# Patient Record
Sex: Male | Born: 1940
Health system: Southern US, Community
[De-identification: ages and names within clinical notes are randomized; demographics above are authoritative.]

## PROBLEM LIST (undated history)

## (undated) DIAGNOSIS — Z860100 Personal history of colon polyps, unspecified: Secondary | ICD-10-CM

## (undated) DIAGNOSIS — M1990 Unspecified osteoarthritis, unspecified site: Secondary | ICD-10-CM

## (undated) DIAGNOSIS — K219 Gastro-esophageal reflux disease without esophagitis: Secondary | ICD-10-CM

## (undated) DIAGNOSIS — T7840XA Allergy, unspecified, initial encounter: Secondary | ICD-10-CM

## (undated) DIAGNOSIS — K649 Unspecified hemorrhoids: Secondary | ICD-10-CM

## (undated) DIAGNOSIS — R569 Unspecified convulsions: Secondary | ICD-10-CM

## (undated) DIAGNOSIS — K5730 Diverticulosis of large intestine without perforation or abscess without bleeding: Secondary | ICD-10-CM

## (undated) DIAGNOSIS — Z87442 Personal history of urinary calculi: Secondary | ICD-10-CM

## (undated) DIAGNOSIS — I517 Cardiomegaly: Secondary | ICD-10-CM

## (undated) DIAGNOSIS — E7800 Pure hypercholesterolemia, unspecified: Secondary | ICD-10-CM

## (undated) DIAGNOSIS — M109 Gout, unspecified: Secondary | ICD-10-CM

## (undated) DIAGNOSIS — K222 Esophageal obstruction: Secondary | ICD-10-CM

## (undated) DIAGNOSIS — I2510 Atherosclerotic heart disease of native coronary artery without angina pectoris: Secondary | ICD-10-CM

## (undated) DIAGNOSIS — I10 Essential (primary) hypertension: Secondary | ICD-10-CM

## (undated) DIAGNOSIS — H409 Unspecified glaucoma: Secondary | ICD-10-CM

## (undated) HISTORY — DX: Unspecified convulsions: R56.9

## (undated) HISTORY — DX: Unspecified osteoarthritis, unspecified site: M19.90

## (undated) HISTORY — DX: Essential (primary) hypertension: I10

## (undated) HISTORY — DX: Cardiomegaly: I51.7

## (undated) HISTORY — DX: Pure hypercholesterolemia, unspecified: E78.00

## (undated) HISTORY — PX: CARDIAC CATHETERIZATION: SHX172

## (undated) HISTORY — PX: CORONARY ANGIOPLASTY: SHX604

## (undated) HISTORY — PX: HERNIA REPAIR: SHX51

## (undated) HISTORY — DX: Unspecified glaucoma: H40.9

## (undated) HISTORY — DX: Personal history of colon polyps, unspecified: Z86.0100

## (undated) HISTORY — DX: Personal history of urinary calculi: Z87.442

## (undated) HISTORY — DX: Allergy, unspecified, initial encounter: T78.40XA

## (undated) HISTORY — DX: Personal history of colonic polyps: Z86.010

---

## 1982-09-01 HISTORY — PX: VASECTOMY: SHX75

## 2005-04-08 ENCOUNTER — Ambulatory Visit: Payer: Self-pay | Admitting: Unknown Physician Specialty

## 2005-12-11 ENCOUNTER — Other Ambulatory Visit: Payer: Self-pay

## 2005-12-18 ENCOUNTER — Ambulatory Visit: Payer: Self-pay | Admitting: General Surgery

## 2007-07-02 ENCOUNTER — Ambulatory Visit: Payer: Self-pay | Admitting: Unknown Physician Specialty

## 2007-07-02 LAB — HM COLONOSCOPY

## 2010-10-03 ENCOUNTER — Ambulatory Visit: Payer: Self-pay | Admitting: Anesthesiology

## 2010-10-08 ENCOUNTER — Ambulatory Visit: Payer: Self-pay | Admitting: General Surgery

## 2011-10-08 ENCOUNTER — Ambulatory Visit: Payer: Self-pay | Admitting: Internal Medicine

## 2011-12-24 ENCOUNTER — Ambulatory Visit: Payer: Self-pay | Admitting: Neurology

## 2013-07-08 ENCOUNTER — Ambulatory Visit: Payer: Self-pay | Admitting: Internal Medicine

## 2013-07-20 ENCOUNTER — Ambulatory Visit: Payer: Self-pay | Admitting: Ophthalmology

## 2013-07-20 HISTORY — PX: EYE SURGERY: SHX253

## 2013-07-29 ENCOUNTER — Encounter: Payer: Self-pay | Admitting: Internal Medicine

## 2013-07-29 ENCOUNTER — Encounter (INDEPENDENT_AMBULATORY_CARE_PROVIDER_SITE_OTHER): Payer: Self-pay

## 2013-07-29 ENCOUNTER — Ambulatory Visit (INDEPENDENT_AMBULATORY_CARE_PROVIDER_SITE_OTHER): Payer: Medicare Other | Admitting: Internal Medicine

## 2013-07-29 VITALS — BP 110/80 | HR 63 | Temp 98.1°F | Ht 68.5 in | Wt 174.0 lb

## 2013-07-29 DIAGNOSIS — I10 Essential (primary) hypertension: Secondary | ICD-10-CM

## 2013-07-29 DIAGNOSIS — N200 Calculus of kidney: Secondary | ICD-10-CM

## 2013-07-29 DIAGNOSIS — M129 Arthropathy, unspecified: Secondary | ICD-10-CM

## 2013-07-29 DIAGNOSIS — Z0000 Encounter for general adult medical examination without abnormal findings: Secondary | ICD-10-CM

## 2013-07-29 DIAGNOSIS — H409 Unspecified glaucoma: Secondary | ICD-10-CM

## 2013-07-29 DIAGNOSIS — M109 Gout, unspecified: Secondary | ICD-10-CM

## 2013-07-29 DIAGNOSIS — E7800 Pure hypercholesterolemia, unspecified: Secondary | ICD-10-CM

## 2013-07-29 DIAGNOSIS — Z9109 Other allergy status, other than to drugs and biological substances: Secondary | ICD-10-CM

## 2013-07-29 DIAGNOSIS — G40909 Epilepsy, unspecified, not intractable, without status epilepticus: Secondary | ICD-10-CM

## 2013-07-29 DIAGNOSIS — K219 Gastro-esophageal reflux disease without esophagitis: Secondary | ICD-10-CM

## 2013-07-29 DIAGNOSIS — M1990 Unspecified osteoarthritis, unspecified site: Secondary | ICD-10-CM

## 2013-07-29 DIAGNOSIS — Z23 Encounter for immunization: Secondary | ICD-10-CM

## 2013-07-29 DIAGNOSIS — Z86010 Personal history of colonic polyps: Secondary | ICD-10-CM

## 2013-07-29 LAB — COMPREHENSIVE METABOLIC PANEL
ALT: 18 U/L (ref 0–53)
AST: 20 U/L (ref 0–37)
Albumin: 4 g/dL (ref 3.5–5.2)
Alkaline Phosphatase: 64 U/L (ref 39–117)
BUN: 15 mg/dL (ref 6–23)
CO2: 28 mEq/L (ref 19–32)
Calcium: 9.4 mg/dL (ref 8.4–10.5)
Chloride: 103 mEq/L (ref 96–112)
Creatinine, Ser: 1.1 mg/dL (ref 0.4–1.5)
GFR: 68.47 mL/min (ref >60.00)
Glucose, Bld: 99 mg/dL (ref 70–99)
Potassium: 4.1 mEq/L (ref 3.5–5.1)
Sodium: 137 mEq/L (ref 135–145)
Total Bilirubin: 0.9 mg/dL (ref 0.3–1.2)
Total Protein: 7.4 g/dL (ref 6.0–8.3)

## 2013-07-29 LAB — CBC WITH DIFFERENTIAL/PLATELET
Basophils Absolute: 0 10*3/uL (ref 0.0–0.1)
Basophils Relative: 0.4 % (ref 0.0–3.0)
Eosinophils Absolute: 0.1 10*3/uL (ref 0.0–0.7)
Eosinophils Relative: 2.2 % (ref 0.0–5.0)
HCT: 45.8 % (ref 39.0–52.0)
Hemoglobin: 15.7 g/dL (ref 13.0–17.0)
Lymphocytes Relative: 24.1 % (ref 12.0–46.0)
Lymphs Abs: 1.5 10*3/uL (ref 0.7–4.0)
MCHC: 34.4 g/dL (ref 30.0–36.0)
MCV: 84.3 fl (ref 78.0–100.0)
Monocytes Absolute: 0.5 10*3/uL (ref 0.1–1.0)
Monocytes Relative: 8.6 % (ref 3.0–12.0)
Neutro Abs: 4 10*3/uL (ref 1.4–7.7)
Neutrophils Relative %: 64.7 % (ref 43.0–77.0)
Platelets: 262 10*3/uL (ref 150.0–400.0)
RBC: 5.43 Mil/uL (ref 4.22–5.81)
RDW: 13.7 % (ref 11.5–14.6)
WBC: 6.1 10*3/uL (ref 4.5–10.5)

## 2013-07-29 LAB — LIPID PANEL
Cholesterol: 192 mg/dL (ref 0–200)
HDL: 31.1 mg/dL — ABNORMAL LOW (ref >39.00)
Total CHOL/HDL Ratio: 6
Triglycerides: 234 mg/dL — ABNORMAL HIGH (ref 0.0–149.0)
VLDL: 46.8 mg/dL — ABNORMAL HIGH (ref 0.0–40.0)

## 2013-07-29 LAB — TSH: TSH: 0.51 u[IU]/mL (ref 0.35–5.50)

## 2013-07-29 LAB — LDL CHOLESTEROL, DIRECT: Direct LDL: 99.1 mg/dL

## 2013-07-29 MED ORDER — CLOBETASOL PROPIONATE 0.05 % EX CREA: 1.0000 "application " | TOPICAL_CREAM | Freq: Two times a day (BID) | CUTANEOUS | Status: DC

## 2013-07-29 NOTE — Progress Notes (Signed)
Pre-visit discussion using our clinic review tool. No additional management support is needed unless otherwise documented below in the visit note.  

## 2013-07-30 LAB — VARICELLA ZOSTER ANTIBODY, IGG: Varicella IgG: 2326 Index — ABNORMAL HIGH (ref <135.00)

## 2013-07-31 ENCOUNTER — Encounter: Payer: Self-pay | Admitting: Internal Medicine

## 2013-07-31 DIAGNOSIS — Z9109 Other allergy status, other than to drugs and biological substances: Secondary | ICD-10-CM | POA: Insufficient documentation

## 2013-07-31 DIAGNOSIS — M109 Gout, unspecified: Secondary | ICD-10-CM | POA: Insufficient documentation

## 2013-07-31 DIAGNOSIS — N200 Calculus of kidney: Secondary | ICD-10-CM | POA: Insufficient documentation

## 2013-07-31 DIAGNOSIS — G40909 Epilepsy, unspecified, not intractable, without status epilepticus: Secondary | ICD-10-CM | POA: Insufficient documentation

## 2013-07-31 DIAGNOSIS — H409 Unspecified glaucoma: Secondary | ICD-10-CM | POA: Insufficient documentation

## 2013-07-31 DIAGNOSIS — Z86010 Personal history of colonic polyps: Secondary | ICD-10-CM | POA: Insufficient documentation

## 2013-07-31 DIAGNOSIS — M1990 Unspecified osteoarthritis, unspecified site: Secondary | ICD-10-CM | POA: Insufficient documentation

## 2013-07-31 DIAGNOSIS — I10 Essential (primary) hypertension: Secondary | ICD-10-CM | POA: Insufficient documentation

## 2013-07-31 DIAGNOSIS — E7800 Pure hypercholesterolemia, unspecified: Secondary | ICD-10-CM | POA: Insufficient documentation

## 2013-07-31 DIAGNOSIS — K219 Gastro-esophageal reflux disease without esophagitis: Secondary | ICD-10-CM | POA: Insufficient documentation

## 2013-07-31 NOTE — Assessment & Plan Note (Signed)
Currently controlled.  No problems reported today.
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
## 2013-07-31 NOTE — Assessment & Plan Note (Signed)
Low cholesterol diet and exercise.  Follow lipid panel.  On no medication now.

## 2013-07-31 NOTE — Assessment & Plan Note (Signed)
Following with Dr Shah.  On Keppra.  Doing well.  No reoccurring seizures since starting.  Follow.   

## 2013-07-31 NOTE — Assessment & Plan Note (Signed)
Has a history of kidney stones.  Last episode in the 80's.  Follow.

## 2013-07-31 NOTE — Assessment & Plan Note (Signed)
Had a colonoscopy in 2008.  Performed by Dr Markham Jordan.  Need to know when due follow up.  Obtain records to review.

## 2013-07-31 NOTE — Assessment & Plan Note (Signed)
Sees opthalmology.  Follow.   

## 2013-07-31 NOTE — Assessment & Plan Note (Signed)
Some previous knee pain.  Better now.  Some stiffness in his fingers.  Denies any further intervention at this time.  Follow.

## 2013-07-31 NOTE — Assessment & Plan Note (Signed)
Blood pressure doing well.  Check metabolic panel.   

## 2013-07-31 NOTE — Assessment & Plan Note (Signed)
Takes colcrys prn.  Follow.  No problems reported.

## 2013-07-31 NOTE — Assessment & Plan Note (Signed)
Takes omeprazole.  No problems reported.

## 2013-08-01 ENCOUNTER — Encounter: Payer: Self-pay | Admitting: *Deleted

## 2013-08-09 ENCOUNTER — Telehealth: Payer: Self-pay | Admitting: Internal Medicine

## 2013-08-09 ENCOUNTER — Ambulatory Visit (INDEPENDENT_AMBULATORY_CARE_PROVIDER_SITE_OTHER): Payer: Medicare Other | Admitting: *Deleted

## 2013-08-09 DIAGNOSIS — Z23 Encounter for immunization: Secondary | ICD-10-CM

## 2013-08-09 NOTE — Telephone Encounter (Signed)
Pt says that on his last visit him and Dr. Lorin Picket discussed a referral to Dr. Adolphus Birchwood for dermatology.

## 2013-08-09 NOTE — Telephone Encounter (Signed)
See me about this.  I am ok with referral.

## 2013-08-09 NOTE — Telephone Encounter (Signed)
Dr.Scott Please advise? 

## 2013-08-11 NOTE — Telephone Encounter (Signed)
10/03/13 @ 1030- [facility]

## 2013-08-11 NOTE — Telephone Encounter (Signed)
Pt to see Dr. Gwen Pounds @ [facility] on 2nd at 1030. LVM for patient to call our office.

## 2013-08-12 NOTE — Telephone Encounter (Signed)
Pt aware of apt.  

## 2013-08-17 ENCOUNTER — Encounter: Payer: Self-pay | Admitting: Internal Medicine

## 2013-08-17 NOTE — Telephone Encounter (Signed)
Prescription written and placed in your box.  I sent him a my chart message informing him rx would be here.

## 2013-09-15 ENCOUNTER — Encounter: Payer: Self-pay | Admitting: Internal Medicine

## 2013-09-28 ENCOUNTER — Ambulatory Visit: Payer: Self-pay | Admitting: Ophthalmology

## 2013-09-29 ENCOUNTER — Encounter: Payer: Self-pay | Admitting: Internal Medicine

## 2013-11-30 ENCOUNTER — Encounter: Payer: Self-pay | Admitting: Internal Medicine

## 2013-12-05 ENCOUNTER — Other Ambulatory Visit: Payer: Self-pay | Admitting: *Deleted

## 2013-12-05 MED ORDER — METOPROLOL TARTRATE 25 MG PO TABS: 12.5000 mg | ORAL_TABLET | Freq: Every day | ORAL | Status: DC

## 2013-12-05 MED ORDER — OMEPRAZOLE 20 MG PO CPDR: 40.0000 mg | DELAYED_RELEASE_CAPSULE | Freq: Every morning | ORAL | Status: DC

## 2013-12-05 MED ORDER — METOPROLOL TARTRATE 12.5 MG HALF TABLET: 12.5000 mg | ORAL_TABLET | Freq: Every day | ORAL | Status: DC

## 2013-12-05 MED ORDER — FLUTICASONE PROPIONATE 50 MCG/ACT NA SUSP: 2.0000 | Freq: Every day | NASAL | Status: DC | PRN

## 2013-12-15 ENCOUNTER — Ambulatory Visit (INDEPENDENT_AMBULATORY_CARE_PROVIDER_SITE_OTHER): Payer: Commercial Managed Care - HMO | Admitting: Internal Medicine

## 2013-12-15 ENCOUNTER — Encounter: Payer: Self-pay | Admitting: Internal Medicine

## 2013-12-15 VITALS — BP 120/70 | HR 63 | Temp 98.1°F | Ht 67.5 in | Wt 176.5 lb

## 2013-12-15 DIAGNOSIS — H409 Unspecified glaucoma: Secondary | ICD-10-CM

## 2013-12-15 DIAGNOSIS — Z860100 Personal history of colon polyps, unspecified: Secondary | ICD-10-CM

## 2013-12-15 DIAGNOSIS — I25119 Atherosclerotic heart disease of native coronary artery with unspecified angina pectoris: Secondary | ICD-10-CM | POA: Insufficient documentation

## 2013-12-15 DIAGNOSIS — Z125 Encounter for screening for malignant neoplasm of prostate: Secondary | ICD-10-CM

## 2013-12-15 DIAGNOSIS — K219 Gastro-esophageal reflux disease without esophagitis: Secondary | ICD-10-CM

## 2013-12-15 DIAGNOSIS — Z9109 Other allergy status, other than to drugs and biological substances: Secondary | ICD-10-CM

## 2013-12-15 DIAGNOSIS — E7800 Pure hypercholesterolemia, unspecified: Secondary | ICD-10-CM

## 2013-12-15 DIAGNOSIS — Z79899 Other long term (current) drug therapy: Secondary | ICD-10-CM

## 2013-12-15 DIAGNOSIS — I2510 Atherosclerotic heart disease of native coronary artery without angina pectoris: Secondary | ICD-10-CM | POA: Insufficient documentation

## 2013-12-15 DIAGNOSIS — R079 Chest pain, unspecified: Secondary | ICD-10-CM

## 2013-12-15 DIAGNOSIS — G40909 Epilepsy, unspecified, not intractable, without status epilepticus: Secondary | ICD-10-CM

## 2013-12-15 DIAGNOSIS — I10 Essential (primary) hypertension: Secondary | ICD-10-CM

## 2013-12-15 LAB — MAGNESIUM: MAGNESIUM: 1.7 mg/dL (ref 1.5–2.5)

## 2013-12-15 LAB — COMPREHENSIVE METABOLIC PANEL
ALBUMIN: 3.8 g/dL (ref 3.5–5.2)
ALT: 19 U/L (ref 0–53)
AST: 23 U/L (ref 0–37)
Alkaline Phosphatase: 64 U/L (ref 39–117)
BUN: 14 mg/dL (ref 6–23)
CHLORIDE: 103 meq/L (ref 96–112)
CO2: 25 meq/L (ref 19–32)
CREATININE: 1.1 mg/dL (ref 0.4–1.5)
Calcium: 9.3 mg/dL (ref 8.4–10.5)
GFR: 69.11 mL/min (ref >60.00)
GLUCOSE: 97 mg/dL (ref 70–99)
POTASSIUM: 4.2 meq/L (ref 3.5–5.1)
Sodium: 138 mEq/L (ref 135–145)
Total Bilirubin: 1.3 mg/dL — ABNORMAL HIGH (ref 0.3–1.2)
Total Protein: 6.9 g/dL (ref 6.0–8.3)

## 2013-12-15 LAB — LIPID PANEL
CHOLESTEROL: 197 mg/dL (ref 0–200)
HDL: 29.5 mg/dL — ABNORMAL LOW (ref >39.00)
LDL CALC: 117 mg/dL — AB (ref 0–99)
TRIGLYCERIDES: 254 mg/dL — AB (ref 0.0–149.0)
Total CHOL/HDL Ratio: 7
VLDL: 50.8 mg/dL — ABNORMAL HIGH (ref 0.0–40.0)

## 2013-12-15 LAB — PSA, MEDICARE: PSA: 1.05 ng/mL (ref 0.10–4.00)

## 2013-12-15 NOTE — Assessment & Plan Note (Signed)
Blood pressure doing well.  Check metabolic panel.   

## 2013-12-15 NOTE — Progress Notes (Signed)
Pre visit review using our clinic review tool, if applicable. No additional management support is needed unless otherwise documented below in the visit note. 

## 2013-12-15 NOTE — Progress Notes (Signed)
Subjective:    Patient ID: Edwin [name], male    DOB: [birth year], 73 y.o.   MRN: [id number]  HPI 73 year old male with past history of hypercholesterolemia, glaucoma, allergies and seizure disorder.  He comes in to day to follow up on these issues as well as for a complete physical exam.  He states he has been doing relatively well.  Continues to follow with Dr Manuella Ghazi for his seizures.  No reoccurrence after starting Keppra.  Had MRI - ok.  Had carotid ultrasound - ok.   Has a history of hypertension.  No significant urinary symptoms.  Previously had negative prostate biopsy.  He does report some increased problems with allergies.  Just started taking allegra on a more regular basis.  Has previously used flonase, but feels this recently aggravated his nose.  Not using saline.  He also reports noticing some chest discomfort with push mowing.  The chest discomforts only occurs at this time.  Stays active.  Breathing stable.  Bowels stable.    Past Medical History  Diagnosis Date  . Arthritis   . Glaucoma   . Allergy   . Hypercholesterolemia   . Seizures   . History of kidney stones   . Hx of colonic polyp     Outpatient Encounter Prescriptions as of 12/15/2013  Medication Sig  . aspirin 81 MG tablet Take 81 mg by mouth at bedtime.  . Cholecalciferol (VITAMIN D3) 2000 UNITS TABS Take by mouth every morning.  . clobetasol cream (TEMOVATE) 7.84 % Apply 1 application topically 2 (two) times daily. Do not use on face or genital area.  Do not use for more 7-10 days in the same place.  . colchicine 0.6 MG tablet Take 0.6 mg by mouth daily as needed.  . fluticasone (FLONASE) 50 MCG/ACT nasal spray Place 2 sprays into both nostrils daily as needed for allergies or rhinitis.  Marland Kitchen latanoprost (XALATAN) 0.005 % ophthalmic solution Place 1 drop into both eyes at bedtime.  . levETIRAcetam (KEPPRA) 500 MG tablet Take 500 mg by mouth 2 (two) times daily.  Marland Kitchen levocetirizine (XYZAL) 5 MG tablet Take 5 mg  by mouth at bedtime as needed for allergies.  . Magnesium 400 MG CAPS Take by mouth at bedtime.  . metoprolol tartrate (LOPRESSOR) 25 MG tablet Take 0.5 tablets (12.5 mg total) by mouth at bedtime.  . Misc Natural Products (TART CHERRY ADVANCED PO) Take 500 mg by mouth every morning.  . Multiple Vitamin (MULTIVITAMIN) tablet Take 1 tablet by mouth daily.  . Omega-3 Fatty Acids (FISH OIL) 1200 MG CAPS Take by mouth 2 (two) times daily.  Marland Kitchen omeprazole (PRILOSEC) 20 MG capsule Take 2 capsules (40 mg total) by mouth every morning.  . polyethylene glycol (MIRALAX / GLYCOLAX) packet Take 17 g by mouth at bedtime.  . prednisoLONE acetate (PRED FORTE) 1 % ophthalmic suspension Place 1 drop into the left eye 3 (three) times daily.  . Psyllium (METAMUCIL PO) Take by mouth every morning.  . Saw Palmetto 450 MG CAPS Take by mouth every morning.    Review of Systems Patient denies any headache, lightheadedness or dizziness.  Allergy symptoms as outlined.   Chest discomfort as outlined.  No increased shortness of breath, cough or congestion.  No known acid reflux.   Takes prilosec regularly.  No nausea or vomiting.  No abdominal pain or cramping.  No bowel change, such as diarrhea, constipation, BRBPR or melana.  No urine change.  No further seizures.  On keppra.         Objective:   Physical Exam  Filed Vitals:   12/15/13 0835  BP: 120/70  Pulse: 63  Temp: 98.1 F (36.7 C)   Blood pressure recheck:  2/61  73 year old male in no acute distress.  HEENT:  Nares - clear.  Oropharynx - without lesions. NECK:  Supple.  Nontender.  No audible carotid bruit.  HEART:  Appears to be regular.   LUNGS:  No crackles or wheezing audible.  Respirations even and unlabored.   RADIAL PULSE:  Equal bilaterally.  ABDOMEN:  Soft.  Nontender.  Bowel sounds present and normal.  No audible abdominal bruit.  GU:  Normal descended testicles.  No palpable testicular nodules.   RECTAL:  Prostate enlarged.   Heme  negative.   EXTREMITIES:  No increased edema present.  DP pulses palpable and equal bilaterally.         Assessment & Plan:  HEALTH MAINTENANCE.  Physical today.   Check psa today.  Colonoscopy 2008.  States due f/u five years since last.  Will refer to GI.  Will need cariology evaluation first.     I spent 25 minutes with the patient and more than 50% of the time was spent in consultation regarding the above.

## 2013-12-15 NOTE — Assessment & Plan Note (Signed)
Worsened recently.  On allegra.  Just started regularly.  He usually uses flonase, but recently stopped.  Thought it made things worse.  Does not use saline.  Instructed on saline nasal spray.  Can try nasacort nasal spray and see if tolerates better.  Continue allegra.  Notify me if symptoms persist.

## 2013-12-15 NOTE — Assessment & Plan Note (Signed)
Sees opthalmology.  Follow.

## 2013-12-15 NOTE — Assessment & Plan Note (Signed)
Low cholesterol diet and exercise.  Follow lipid panel.  On no medication now.  Recheck today.

## 2013-12-15 NOTE — Assessment & Plan Note (Signed)
Following with Dr Manuella Ghazi.  On Keppra.  Doing well.  No reoccurring seizures since starting.  Follow.

## 2013-12-15 NOTE — Assessment & Plan Note (Signed)
Had a colonoscopy in 2008.  Performed by Dr Tiffany Kocher.  States due f/u colonoscopy five years after last.  Will schedule an appt with GI for evaluation for colonoscopy.

## 2013-12-15 NOTE — Assessment & Plan Note (Signed)
Takes omeprazole.  No problems reported.  Has had EGD.

## 2013-12-18 ENCOUNTER — Encounter: Payer: Self-pay | Admitting: Internal Medicine

## 2013-12-18 ENCOUNTER — Telehealth: Payer: Self-pay | Admitting: Internal Medicine

## 2013-12-18 NOTE — Assessment & Plan Note (Signed)
He has noticed chest pain with push mowing.  EKG obtained and revealed SR with TWI in Lead III and some voltage criteria for LVH.  Given symptoms, will refer to cardiology for further evaluation and testing.

## 2013-12-18 NOTE — Telephone Encounter (Signed)
Pt notified of lab results via my chart.  Needs a non fasting lab in 2 weeks.  Please schedule and contact him with a lab appt date and time.  Thanks.    Dr Nicki Reaper

## 2013-12-20 NOTE — Telephone Encounter (Signed)
Appointment 5/6 sent my chart messge letting pt know about appointment date and time

## 2013-12-26 ENCOUNTER — Encounter: Payer: Self-pay | Admitting: Internal Medicine

## 2013-12-29 ENCOUNTER — Encounter: Payer: Self-pay | Admitting: Internal Medicine

## 2014-01-03 ENCOUNTER — Other Ambulatory Visit (INDEPENDENT_AMBULATORY_CARE_PROVIDER_SITE_OTHER): Payer: Commercial Managed Care - HMO

## 2014-01-03 ENCOUNTER — Telehealth: Payer: Self-pay | Admitting: Internal Medicine

## 2014-01-03 DIAGNOSIS — R17 Unspecified jaundice: Secondary | ICD-10-CM

## 2014-01-03 NOTE — Telephone Encounter (Signed)
Pt came in to office to check status of cardiology referral to Dr. Ubaldo Glassing.  No appt made at this time.

## 2014-01-04 ENCOUNTER — Other Ambulatory Visit: Payer: Commercial Managed Care - HMO

## 2014-01-04 ENCOUNTER — Encounter: Payer: Self-pay | Admitting: Internal Medicine

## 2014-01-04 LAB — HEPATIC FUNCTION PANEL
ALT: 21 U/L (ref 0–53)
AST: 24 U/L (ref 0–37)
Albumin: 4 g/dL (ref 3.5–5.2)
Alkaline Phosphatase: 64 U/L (ref 39–117)
BILIRUBIN TOTAL: 0.9 mg/dL (ref 0.2–1.2)
Bilirubin, Direct: 0.1 mg/dL (ref 0.0–0.3)
Total Protein: 7 g/dL (ref 6.0–8.3)

## 2014-01-12 NOTE — Telephone Encounter (Signed)
Patient had an appt with Dr. Kyra Searles on 01/09/14. Received [employer] auth for Dr. Kyra Searles Received fax from [employer] back Treating Provider Dr. Bartholome Bill # of Visits 6 Start Date 01/09/14 End Date 04/09/14 CPT 99499 DX 786.50

## 2014-01-26 ENCOUNTER — Ambulatory Visit: Payer: Self-pay | Admitting: Cardiology

## 2014-01-26 LAB — CK TOTAL AND CKMB (NOT AT ARMC)
CK, TOTAL: 88 U/L
CK-MB: 1.9 ng/mL (ref 0.5–3.6)

## 2014-01-27 LAB — BASIC METABOLIC PANEL
Anion Gap: 6 — ABNORMAL LOW (ref 7–16)
BUN: 14 mg/dL (ref 7–18)
Calcium, Total: 8.3 mg/dL — ABNORMAL LOW (ref 8.5–10.1)
Chloride: 110 mmol/L — ABNORMAL HIGH (ref 98–107)
Co2: 23 mmol/L (ref 21–32)
Creatinine: 0.97 mg/dL (ref 0.60–1.30)
EGFR (African American): >60
Glucose: 89 mg/dL (ref 65–99)
Osmolality: 277 (ref 275–301)
POTASSIUM: 3.7 mmol/L (ref 3.5–5.1)
SODIUM: 139 mmol/L (ref 136–145)

## 2014-02-06 ENCOUNTER — Telehealth: Payer: Self-pay | Admitting: Internal Medicine

## 2014-02-06 DIAGNOSIS — I2510 Atherosclerotic heart disease of native coronary artery without angina pectoris: Secondary | ICD-10-CM

## 2014-02-06 NOTE — Telephone Encounter (Addendum)
Patient dropped off records from stent placement. Papers are in Dr. Bary Leriche box. Patient will also need another referral to Dr. Bethanne Ginger office to be seen later. Patient stated that he never received a referral for a dermatologist.msn

## 2014-02-07 ENCOUNTER — Other Ambulatory Visit: Payer: Self-pay | Admitting: Internal Medicine

## 2014-02-07 DIAGNOSIS — L989 Disorder of the skin and subcutaneous tissue, unspecified: Secondary | ICD-10-CM

## 2014-02-07 NOTE — Telephone Encounter (Signed)
I have placed the order for the cardiology referral.  Please clarify with him why he needs dermatology referral.

## 2014-02-07 NOTE — Progress Notes (Signed)
Order placed for dermatology referral.  

## 2014-02-07 NOTE — Telephone Encounter (Signed)
Order placed for cardiology referral.   

## 2014-02-07 NOTE — Telephone Encounter (Signed)
Order has already been placed for cardiology referral.  Order also placed for dermatology referral.

## 2014-02-07 NOTE — Telephone Encounter (Signed)
Placed in folder

## 2014-02-07 NOTE — Telephone Encounter (Signed)
Pt notified of cardiology referral & states that he saw Dr. Nehemiah Massed back in February & wanted him to return for a follow-up. They would not scheduled the f/u until they received the referral. He was being seen to evaluate molds & other spots on his body.

## 2014-02-27 ENCOUNTER — Encounter: Payer: Self-pay | Admitting: Internal Medicine

## 2014-03-06 ENCOUNTER — Encounter: Payer: Self-pay | Admitting: Internal Medicine

## 2014-03-06 DIAGNOSIS — I2510 Atherosclerotic heart disease of native coronary artery without angina pectoris: Secondary | ICD-10-CM

## 2014-03-21 ENCOUNTER — Telehealth: Payer: Self-pay | Admitting: Internal Medicine

## 2014-03-21 DIAGNOSIS — H409 Unspecified glaucoma: Secondary | ICD-10-CM

## 2014-03-21 NOTE — Telephone Encounter (Signed)
My chart message sent to see if pt needed an earlier appt.

## 2014-03-24 NOTE — Telephone Encounter (Signed)
Order placed for opthalmology referral 

## 2014-03-24 NOTE — Addendum Note (Signed)
Addended by: Alisa Graff on: 03/24/2014 01:21 PM   Modules accepted: Orders

## 2014-03-29 ENCOUNTER — Telehealth: Payer: Self-pay

## 2014-03-29 NOTE — Telephone Encounter (Signed)
The pt has received [facility] authorization for Dr.Brasington  Auth # [id number] (good through 09/2014, for 6 visits)

## 2014-04-04 ENCOUNTER — Telehealth: Payer: Self-pay | Admitting: Internal Medicine

## 2014-04-04 NOTE — Telephone Encounter (Signed)
Pt had sent me a message requesting an earlier appt.  Please schedule him for 04/06/14 at 2:00 - block 30 minutes.  Thanks.  Dr Nicki Reaper

## 2014-04-06 ENCOUNTER — Ambulatory Visit (INDEPENDENT_AMBULATORY_CARE_PROVIDER_SITE_OTHER): Payer: Commercial Managed Care - HMO | Admitting: Internal Medicine

## 2014-04-06 ENCOUNTER — Encounter: Payer: Self-pay | Admitting: Internal Medicine

## 2014-04-06 VITALS — BP 100/70 | HR 67 | Temp 98.0°F | Ht 67.5 in | Wt 171.5 lb

## 2014-04-06 DIAGNOSIS — E7800 Pure hypercholesterolemia, unspecified: Secondary | ICD-10-CM

## 2014-04-06 DIAGNOSIS — R5381 Other malaise: Secondary | ICD-10-CM

## 2014-04-06 DIAGNOSIS — I2510 Atherosclerotic heart disease of native coronary artery without angina pectoris: Secondary | ICD-10-CM

## 2014-04-06 DIAGNOSIS — I10 Essential (primary) hypertension: Secondary | ICD-10-CM

## 2014-04-06 DIAGNOSIS — K219 Gastro-esophageal reflux disease without esophagitis: Secondary | ICD-10-CM

## 2014-04-06 DIAGNOSIS — R5383 Other fatigue: Secondary | ICD-10-CM

## 2014-04-06 NOTE — Progress Notes (Signed)
Pre visit review using our clinic review tool, if applicable. No additional management support is needed unless otherwise documented below in the visit note. 

## 2014-04-07 ENCOUNTER — Encounter: Payer: Self-pay | Admitting: Internal Medicine

## 2014-04-07 LAB — URINALYSIS, ROUTINE W REFLEX MICROSCOPIC
Bilirubin Urine: NEGATIVE
Hgb urine dipstick: NEGATIVE
Leukocytes, UA: NEGATIVE
Nitrite: NEGATIVE
PH: 6 (ref 5.0–8.0)
RBC / HPF: NONE SEEN (ref 0–?)
SPECIFIC GRAVITY, URINE: 1.02 (ref 1.000–1.030)
Total Protein, Urine: NEGATIVE
Urine Glucose: NEGATIVE
Urobilinogen, UA: 0.2 (ref 0.0–1.0)
WBC, UA: NONE SEEN (ref 0–?)

## 2014-04-07 LAB — CBC WITH DIFFERENTIAL/PLATELET
Basophils Absolute: 0 10*3/uL (ref 0.0–0.1)
Basophils Relative: 0.7 % (ref 0.0–3.0)
Eosinophils Absolute: 0.2 10*3/uL (ref 0.0–0.7)
Eosinophils Relative: 3.4 % (ref 0.0–5.0)
HCT: 42.5 % (ref 39.0–52.0)
Hemoglobin: 14.6 g/dL (ref 13.0–17.0)
Lymphocytes Relative: 23.3 % (ref 12.0–46.0)
Lymphs Abs: 1.4 10*3/uL (ref 0.7–4.0)
MCHC: 34.4 g/dL (ref 30.0–36.0)
MCV: 85.5 fl (ref 78.0–100.0)
MONO ABS: 0.5 10*3/uL (ref 0.1–1.0)
Monocytes Relative: 8.3 % (ref 3.0–12.0)
Neutro Abs: 3.9 10*3/uL (ref 1.4–7.7)
Neutrophils Relative %: 64.3 % (ref 43.0–77.0)
PLATELETS: 254 10*3/uL (ref 150.0–400.0)
RBC: 4.97 Mil/uL (ref 4.22–5.81)
RDW: 13.8 % (ref 11.5–15.5)
WBC: 6.1 10*3/uL (ref 4.0–10.5)

## 2014-04-07 LAB — BASIC METABOLIC PANEL
BUN: 15 mg/dL (ref 6–23)
CO2: 25 mEq/L (ref 19–32)
Calcium: 9.4 mg/dL (ref 8.4–10.5)
Chloride: 107 mEq/L (ref 96–112)
Creatinine, Ser: 1.1 mg/dL (ref 0.4–1.5)
GFR: 70.52 mL/min (ref >60.00)
Glucose, Bld: 92 mg/dL (ref 70–99)
Potassium: 4 mEq/L (ref 3.5–5.1)
SODIUM: 138 meq/L (ref 135–145)

## 2014-04-09 ENCOUNTER — Encounter: Payer: Self-pay | Admitting: Internal Medicine

## 2014-04-09 DIAGNOSIS — R5383 Other fatigue: Secondary | ICD-10-CM | POA: Insufficient documentation

## 2014-04-09 DIAGNOSIS — R531 Weakness: Secondary | ICD-10-CM | POA: Insufficient documentation

## 2014-04-09 NOTE — Assessment & Plan Note (Signed)
Is s/p PCI LAD.  On plavix.  Doing well.  Followed by Dr Ubaldo Glassing.

## 2014-04-09 NOTE — Progress Notes (Signed)
Subjective:    Patient ID: Edwin [name], male    DOB: 1940-10-02, 73 y.o.   MRN: [id number]  HPI 73 year old male with past history of hypercholesterolemia, glaucoma, allergies and seizure disorder.  He comes in today as a work in appt.  His wife had mentioned that he appears to be having to take a nap during the day and previously did not take a nap.   He was found recently to have an abnormal functional study.  Is now s/p PCI of his LAD (5/15).  Sees Dr Ubaldo Glassing.  Has been doing well from a cardiac standpoint.  Was started on plavix and simvastatin after his stent placement.   He states he has been doing relatively well.  Continues to follow with Dr Manuella Ghazi for his seizures.  No reoccurrence after starting Keppra.  Had MRI - ok.  Had carotid ultrasound - ok.   Has a history of hypertension.  No significant urinary symptoms.   Stays active.  Breathing stable.  Bowels stable.  He reports he is sleeping well.  Feels rested when he awakens.  States sometimes he will take a short nap during the day, but does not feel overly fatigued.  He is walking.  No chest pain or tightness with increased activity or exertion.     Past Medical History  Diagnosis Date  . Arthritis   . Glaucoma   . Allergy   . Hypercholesterolemia   . Seizures   . History of kidney stones   . Hx of colonic polyp     Outpatient Encounter Prescriptions as of 04/06/2014  Medication Sig  . aspirin 81 MG tablet Take 81 mg by mouth at bedtime.  . Cholecalciferol (VITAMIN D3) 2000 UNITS TABS Take by mouth every morning.  . clobetasol cream (TEMOVATE) 5.10 % Apply 1 application topically 2 (two) times daily. Do not use on face or genital area.  Do not use for more 7-10 days in the same place.  . clopidogrel (PLAVIX) 75 MG tablet Take 75 mg by mouth daily.  . colchicine 0.6 MG tablet Take 0.6 mg by mouth daily as needed.  . fluticasone (FLONASE) 50 MCG/ACT nasal spray Place 2 sprays into both nostrils daily as needed for allergies or  rhinitis.  Marland Kitchen latanoprost (XALATAN) 0.005 % ophthalmic solution Place 1 drop into both eyes at bedtime.  . levETIRAcetam (KEPPRA) 500 MG tablet Take 500 mg by mouth 2 (two) times daily.  Marland Kitchen levocetirizine (XYZAL) 5 MG tablet Take 5 mg by mouth at bedtime as needed for allergies.  . Magnesium 400 MG CAPS Take by mouth at bedtime.  . metoprolol tartrate (LOPRESSOR) 25 MG tablet Take 0.5 tablets (12.5 mg total) by mouth at bedtime.  . Misc Natural Products (TART CHERRY ADVANCED PO) Take 500 mg by mouth every morning.  . Multiple Vitamin (MULTIVITAMIN) tablet Take 1 tablet by mouth daily.  . Omega-3 Fatty Acids (FISH OIL) 1200 MG CAPS Take by mouth 2 (two) times daily.  Marland Kitchen omeprazole (PRILOSEC) 20 MG capsule Take 2 capsules (40 mg total) by mouth every morning.  . polyethylene glycol (MIRALAX / GLYCOLAX) packet Take 17 g by mouth at bedtime.  . prednisoLONE acetate (PRED FORTE) 1 % ophthalmic suspension Place 1 drop into the left eye 3 (three) times daily.  . Psyllium (METAMUCIL PO) Take by mouth every morning.  . Saw Palmetto 450 MG CAPS Take by mouth every morning.  . simvastatin (ZOCOR) 40 MG tablet Take 40 mg by mouth  daily.    Review of Systems Patient denies any headache, lightheadedness or dizziness.  No chest pain, tightness or palpitations.  No increased shortness of breath, cough or congestion.  No known acid reflux.   Takes prilosec regularly.  Given on plavix, ciscussed with him regarding changing to protonix.  No nausea or vomiting.  No abdominal pain or cramping.  No bowel change, such as diarrhea, constipation, BRBPR or melana.  No urine change.  No further seizures.  On keppra.   Is walking.        Objective:   Physical Exam  Filed Vitals:   04/06/14 1415  BP: 100/70  Pulse: 67  Temp: 98 F (36.7 C)   Blood pressure recheck:  74/49  73 year old male in no acute distress.  HEENT:  Nares - clear.  Oropharynx - without lesions. NECK:  Supple.  Nontender.  No audible carotid  bruit.  HEART:  Appears to be regular.   LUNGS:  No crackles or wheezing audible.  Respirations even and unlabored.   RADIAL PULSE:  Equal bilaterally.  ABDOMEN:  Soft.  Nontender.  Bowel sounds present and normal.  No audible abdominal bruit.  EXTREMITIES:  No increased edema present.  DP pulses palpable and equal bilaterally.         Assessment & Plan:  HEALTH MAINTENANCE.  Physical last visit.   PSA just checked and wnl.   Colonoscopy 2008.  States was due f/u five years.  Has stent now.  On plavix.  Will need to postpone for now.    I spent 25 minutes with the patient and more than 50% of the time was spent in consultation regarding the above.

## 2014-04-09 NOTE — Assessment & Plan Note (Addendum)
Pt reports no significant fatigue.  See HPI.  Just had intervention.  Is exercising/walking.  Discussed heart track.  On simvastatin 56m.  Question if could be contributing.  Check cbc and met b to confirm normal.  We discussed sleep apnea.  Does not wake up fatigued, etc.  Follow.

## 2014-04-09 NOTE — Assessment & Plan Note (Signed)
On simvastatin 40mg  now.  Follow.

## 2014-04-09 NOTE — Assessment & Plan Note (Signed)
Takes omeprazole.  No problems reported.  Has had EGD.  Discussed with him regarding changing to protonix, since on plavix.  He wishes to continue on omeprazole.  Will notify me if he changes his mind.

## 2014-04-09 NOTE — Assessment & Plan Note (Signed)
Blood pressure doing well.  Follow.  Follow metabolic panel.  

## 2014-04-10 NOTE — Telephone Encounter (Signed)
Unread [employer] mailed to patient

## 2014-04-18 ENCOUNTER — Other Ambulatory Visit: Payer: Self-pay | Admitting: *Deleted

## 2014-04-18 MED ORDER — OMEPRAZOLE 20 MG PO CPDR: 40.0000 mg | DELAYED_RELEASE_CAPSULE | Freq: Every morning | ORAL | Status: DC

## 2014-04-19 ENCOUNTER — Encounter: Payer: Self-pay | Admitting: Internal Medicine

## 2014-06-19 ENCOUNTER — Ambulatory Visit: Payer: Commercial Managed Care - HMO | Admitting: Internal Medicine

## 2014-06-20 ENCOUNTER — Ambulatory Visit (INDEPENDENT_AMBULATORY_CARE_PROVIDER_SITE_OTHER): Payer: Commercial Managed Care - HMO

## 2014-06-20 DIAGNOSIS — Z23 Encounter for immunization: Secondary | ICD-10-CM

## 2014-07-10 ENCOUNTER — Ambulatory Visit: Payer: Commercial Managed Care - HMO | Admitting: Internal Medicine

## 2014-08-03 ENCOUNTER — Ambulatory Visit: Payer: Commercial Managed Care - HMO | Admitting: Internal Medicine

## 2014-09-05 ENCOUNTER — Encounter: Payer: Self-pay | Admitting: Internal Medicine

## 2014-09-12 DIAGNOSIS — G40209 Localization-related (focal) (partial) symptomatic epilepsy and epileptic syndromes with complex partial seizures, not intractable, without status epilepticus: Secondary | ICD-10-CM | POA: Diagnosis not present

## 2014-09-22 ENCOUNTER — Ambulatory Visit: Payer: Commercial Managed Care - HMO | Admitting: Internal Medicine

## 2014-10-17 DIAGNOSIS — H40003 Preglaucoma, unspecified, bilateral: Secondary | ICD-10-CM | POA: Diagnosis not present

## 2014-11-08 ENCOUNTER — Encounter: Payer: Self-pay | Admitting: Internal Medicine

## 2014-11-08 ENCOUNTER — Ambulatory Visit (INDEPENDENT_AMBULATORY_CARE_PROVIDER_SITE_OTHER): Payer: Commercial Managed Care - HMO | Admitting: Internal Medicine

## 2014-11-08 VITALS — BP 140/80 | HR 56 | Temp 97.9°F | Ht 67.5 in | Wt 177.4 lb

## 2014-11-08 DIAGNOSIS — Z86010 Personal history of colonic polyps: Secondary | ICD-10-CM

## 2014-11-08 DIAGNOSIS — Z0000 Encounter for general adult medical examination without abnormal findings: Secondary | ICD-10-CM

## 2014-11-08 DIAGNOSIS — I10 Essential (primary) hypertension: Secondary | ICD-10-CM

## 2014-11-08 DIAGNOSIS — K219 Gastro-esophageal reflux disease without esophagitis: Secondary | ICD-10-CM | POA: Diagnosis not present

## 2014-11-08 DIAGNOSIS — I2510 Atherosclerotic heart disease of native coronary artery without angina pectoris: Secondary | ICD-10-CM

## 2014-11-08 DIAGNOSIS — E7800 Pure hypercholesterolemia, unspecified: Secondary | ICD-10-CM

## 2014-11-08 DIAGNOSIS — G40909 Epilepsy, unspecified, not intractable, without status epilepticus: Secondary | ICD-10-CM | POA: Diagnosis not present

## 2014-11-08 LAB — LIPID PANEL
CHOLESTEROL: 141 mg/dL (ref 0–200)
HDL: 32.1 mg/dL — ABNORMAL LOW (ref >39.00)
LDL Cholesterol: 78 mg/dL (ref 0–99)
NonHDL: 108.9
Total CHOL/HDL Ratio: 4
Triglycerides: 154 mg/dL — ABNORMAL HIGH (ref 0.0–149.0)
VLDL: 30.8 mg/dL (ref 0.0–40.0)

## 2014-11-08 LAB — BASIC METABOLIC PANEL
BUN: 15 mg/dL (ref 6–23)
CALCIUM: 9.9 mg/dL (ref 8.4–10.5)
CO2: 27 meq/L (ref 19–32)
Chloride: 106 mEq/L (ref 96–112)
Creatinine, Ser: 1.11 mg/dL (ref 0.40–1.50)
GFR: 68.94 mL/min (ref >60.00)
Glucose, Bld: 106 mg/dL — ABNORMAL HIGH (ref 70–99)
Potassium: 4.1 mEq/L (ref 3.5–5.1)
SODIUM: 139 meq/L (ref 135–145)

## 2014-11-08 LAB — TSH: TSH: 2.16 u[IU]/mL (ref 0.35–4.50)

## 2014-11-08 LAB — HEPATIC FUNCTION PANEL
ALT: 25 U/L (ref 0–53)
AST: 23 U/L (ref 0–37)
Albumin: 4.3 g/dL (ref 3.5–5.2)
Alkaline Phosphatase: 63 U/L (ref 39–117)
BILIRUBIN DIRECT: 0.2 mg/dL (ref 0.0–0.3)
TOTAL PROTEIN: 7.6 g/dL (ref 6.0–8.3)
Total Bilirubin: 1 mg/dL (ref 0.2–1.2)

## 2014-11-08 MED ORDER — OMEPRAZOLE 20 MG PO CPDR: 40.0000 mg | DELAYED_RELEASE_CAPSULE | Freq: Every morning | ORAL | Status: DC

## 2014-11-08 MED ORDER — METOPROLOL TARTRATE 25 MG PO TABS: 12.5000 mg | ORAL_TABLET | Freq: Every day | ORAL | Status: DC

## 2014-11-08 NOTE — Progress Notes (Signed)
Patient ID: Edwin [name], male   DOB: April 02, 1941, 74 y.o.   MRN: [id number]   Subjective:    Patient ID: Edwin [name], male    DOB: 27-Mar-1941, 74 y.o.   MRN: [id number]  HPI  Patient here for a scheduled follow up.  Has a history of CAD s/p PCI of his LAD (12/2013).  Sees Dr Ubaldo Glassing.  Has a history of seizures.  Sees Dr Manuella Ghazi.  On Keppra and doing well.  Tries to stay active.  No cardiac symptoms with increased activity or exertion.  Breathing stable.  Eating and drinking well.  Has f/u with Dr Ubaldo Glassing tomorrow.  Bowels stable.  Increased stress with his wife's health issues.  Request refill on his antifungal medication.     Past Medical History  Diagnosis Date  . Arthritis   . Glaucoma   . Allergy   . Hypercholesterolemia   . Seizures   . History of kidney stones   . Hx of colonic polyp      Current Outpatient Prescriptions on File Prior to Visit  Medication Sig Dispense Refill  . aspirin 81 MG tablet Take 81 mg by mouth at bedtime.    . Cholecalciferol (VITAMIN D3) 2000 UNITS TABS Take by mouth every morning.    . clobetasol cream (TEMOVATE) 7.61 % Apply 1 application topically 2 (two) times daily. Do not use on face or genital area.  Do not use for more 7-10 days in the same place. 15 g 0  . clopidogrel (PLAVIX) 75 MG tablet Take 75 mg by mouth daily.    . colchicine 0.6 MG tablet Take 0.6 mg by mouth daily as needed.    . fluticasone (FLONASE) 50 MCG/ACT nasal spray Place 2 sprays into both nostrils daily as needed for allergies or rhinitis. 48 g 1  . latanoprost (XALATAN) 0.005 % ophthalmic solution Place 1 drop into both eyes at bedtime.    . levETIRAcetam (KEPPRA) 500 MG tablet Take 500 mg by mouth 2 (two) times daily.    Marland Kitchen levocetirizine (XYZAL) 5 MG tablet Take 5 mg by mouth at bedtime as needed for allergies.    . Magnesium 400 MG CAPS Take by mouth at bedtime.    . Misc Natural Products (TART CHERRY ADVANCED PO) Take 500 mg by mouth every morning.    . Multiple  Vitamin (MULTIVITAMIN) tablet Take 1 tablet by mouth daily.    . Omega-3 Fatty Acids (FISH OIL) 1200 MG CAPS Take by mouth 2 (two) times daily.    . polyethylene glycol (MIRALAX / GLYCOLAX) packet Take 17 g by mouth at bedtime.    . prednisoLONE acetate (PRED FORTE) 1 % ophthalmic suspension Place 1 drop into the left eye 3 (three) times daily.    . Psyllium (METAMUCIL PO) Take by mouth every morning.    . Saw Palmetto 450 MG CAPS Take by mouth every morning.    . simvastatin (ZOCOR) 40 MG tablet Take 40 mg by mouth daily.     No current facility-administered medications on file prior to visit.    Review of Systems  Constitutional: Negative for appetite change and unexpected weight change.  HENT: Negative for congestion and sinus pressure.   Respiratory: Negative for cough, chest tightness and shortness of breath.   Cardiovascular: Negative for chest pain, palpitations and leg swelling.  Gastrointestinal: Negative for nausea, vomiting, abdominal pain and diarrhea.  Skin: Negative for color change and rash.  Neurological: Negative for dizziness, light-headedness and headaches.  Psychiatric/Behavioral:  Increased stress with his wife's medical issues.         Objective:    Physical Exam  Constitutional: He appears well-developed and well-nourished. No distress.  HENT:  Nose: Nose normal.  Mouth/Throat: Oropharynx is clear and moist.  Neck: Neck supple. No thyromegaly present.  Cardiovascular: Normal rate and regular rhythm.   Pulmonary/Chest: Effort normal and breath sounds normal. No respiratory distress.  Abdominal: Soft. Bowel sounds are normal. There is no tenderness.  Musculoskeletal: He exhibits no edema or tenderness.  Lymphadenopathy:    He has no cervical adenopathy.  Skin: No rash noted.  Psychiatric: He has a normal mood and affect. His behavior is normal.    BP 140/80 mmHg  Pulse 56  Temp(Src) 97.9 F (36.6 C) (Oral)  Ht 5' 7.5" (1.715 m)  Wt 177 lb 6 oz  (80.457 kg)  BMI 27.35 kg/m2  SpO2 97% Wt Readings from Last 3 Encounters:  11/08/14 177 lb 6 oz (80.457 kg)  04/06/14 171 lb 8 oz (77.792 kg)  12/15/13 176 lb 8 oz (80.06 kg)     Lab Results  Component Value Date   WBC 6.1 04/06/2014   HGB 14.6 04/06/2014   HCT 42.5 04/06/2014   PLT 254.0 04/06/2014   GLUCOSE 106* 11/08/2014   CHOL 141 11/08/2014   TRIG 154.0* 11/08/2014   HDL 32.10* 11/08/2014   LDLDIRECT 99.1 07/29/2013   LDLCALC 78 11/08/2014   ALT 25 11/08/2014   AST 23 11/08/2014   NA 139 11/08/2014   K 4.1 11/08/2014   CL 106 11/08/2014   CREATININE 1.11 11/08/2014   BUN 15 11/08/2014   CO2 27 11/08/2014   TSH 2.16 11/08/2014   PSA 1.05 12/15/2013       Assessment & Plan:   Problem List Items Addressed This Visit    CAD (coronary artery disease)    Heart cath - as outlined.  S/p stent placement mid LAD (99%-0%).  Continue aggressive risk factor modification.        Relevant Medications   metoprolol tartrate (LOPRESSOR) tablet   Other Relevant Orders   TSH (Completed)   Essential hypertension, benign - Primary    Blood pressure as outlined.  Have him spot check his pressure.  Follow pressures.  Check metabolic panel.        Relevant Medications   metoprolol tartrate (LOPRESSOR) tablet   Other Relevant Orders   Basic metabolic panel (Completed)   GERD (gastroesophageal reflux disease)    Controlled on omeprazole.        Relevant Medications   omeprazole (PRILOSEC) capsule   Health care maintenance    Physical 12/15/13.  PSA 1.03 12/15/13.  Colonoscopy 2008.       History of colonic polyps    Had colonoscopy in 2008.  Is overdue f/u.  Had him scheduled with GI.  Had the stent.  On plavix.  He will d/w Dr Ubaldo Glassing regarding when can proceed with colonoscopy.        Hypercholesterolemia    Low cholesterol diet and exercise.  On simvastatin.  Follow lipid panel and liver function tests.        Relevant Medications   metoprolol tartrate (LOPRESSOR)  tablet   Other Relevant Orders   Lipid panel (Completed)   Hepatic function panel (Completed)   Seizure disorder    Seeing Dr Manuella Ghazi.  On Keppra.  Doing well.           Einar Pheasant, MD

## 2014-11-08 NOTE — Progress Notes (Signed)
Pre visit review using our clinic review tool, if applicable. No additional management support is needed unless otherwise documented below in the visit note. 

## 2014-11-09 ENCOUNTER — Encounter: Payer: Self-pay | Admitting: Internal Medicine

## 2014-11-09 DIAGNOSIS — Z9861 Coronary angioplasty status: Secondary | ICD-10-CM | POA: Diagnosis not present

## 2014-11-09 DIAGNOSIS — I10 Essential (primary) hypertension: Secondary | ICD-10-CM | POA: Diagnosis not present

## 2014-11-09 DIAGNOSIS — I2510 Atherosclerotic heart disease of native coronary artery without angina pectoris: Secondary | ICD-10-CM | POA: Diagnosis not present

## 2014-11-09 DIAGNOSIS — E780 Pure hypercholesterolemia: Secondary | ICD-10-CM | POA: Diagnosis not present

## 2014-11-10 ENCOUNTER — Ambulatory Visit: Payer: Commercial Managed Care - HMO | Admitting: Internal Medicine

## 2014-11-12 ENCOUNTER — Encounter: Payer: Self-pay | Admitting: Internal Medicine

## 2014-11-12 DIAGNOSIS — Z0000 Encounter for general adult medical examination without abnormal findings: Secondary | ICD-10-CM | POA: Insufficient documentation

## 2014-11-12 MED ORDER — CICLOPIROX 8 % EX SOLN: Freq: Every day | CUTANEOUS | Status: DC

## 2014-11-12 NOTE — Assessment & Plan Note (Signed)
Seeing Dr Manuella Ghazi.  On Keppra.  Doing well.

## 2014-11-12 NOTE — Assessment & Plan Note (Signed)
Controlled on omeprazole.   

## 2014-11-12 NOTE — Assessment & Plan Note (Signed)
Low cholesterol diet and exercise.  On simvastatin.  Follow lipid panel and liver function tests.   

## 2014-11-12 NOTE — Assessment & Plan Note (Signed)
Physical 12/15/13.  PSA 1.03 12/15/13.  Colonoscopy 2008.

## 2014-11-12 NOTE — Assessment & Plan Note (Signed)
Blood pressure as outlined.  Have him spot check his pressure.  Follow pressures.  Check metabolic panel.

## 2014-11-12 NOTE — Assessment & Plan Note (Signed)
Had colonoscopy in 2008.  Is overdue f/u.  Had him scheduled with GI.  Had the stent.  On plavix.  He will d/w Dr Ubaldo Glassing regarding when can proceed with colonoscopy.

## 2014-11-12 NOTE — Assessment & Plan Note (Signed)
Heart cath - as outlined.  S/p stent placement mid LAD (99%-0%).  Continue aggressive risk factor modification.

## 2014-11-13 ENCOUNTER — Encounter: Payer: Self-pay | Admitting: *Deleted

## 2014-11-23 DIAGNOSIS — L720 Epidermal cyst: Secondary | ICD-10-CM | POA: Diagnosis not present

## 2014-11-23 DIAGNOSIS — D229 Melanocytic nevi, unspecified: Secondary | ICD-10-CM | POA: Diagnosis not present

## 2014-11-23 DIAGNOSIS — L570 Actinic keratosis: Secondary | ICD-10-CM | POA: Diagnosis not present

## 2014-11-23 DIAGNOSIS — D1800 Hemangioma unspecified site: Secondary | ICD-10-CM | POA: Diagnosis not present

## 2014-11-23 DIAGNOSIS — L304 Erythema intertrigo: Secondary | ICD-10-CM | POA: Diagnosis not present

## 2014-11-23 DIAGNOSIS — L821 Other seborrheic keratosis: Secondary | ICD-10-CM | POA: Diagnosis not present

## 2014-11-23 DIAGNOSIS — L578 Other skin changes due to chronic exposure to nonionizing radiation: Secondary | ICD-10-CM | POA: Diagnosis not present

## 2014-11-23 DIAGNOSIS — L820 Inflamed seborrheic keratosis: Secondary | ICD-10-CM | POA: Diagnosis not present

## 2015-03-12 ENCOUNTER — Ambulatory Visit (INDEPENDENT_AMBULATORY_CARE_PROVIDER_SITE_OTHER): Payer: Commercial Managed Care - HMO | Admitting: Internal Medicine

## 2015-03-12 ENCOUNTER — Telehealth: Payer: Self-pay | Admitting: Internal Medicine

## 2015-03-12 ENCOUNTER — Encounter: Payer: Self-pay | Admitting: Internal Medicine

## 2015-03-12 ENCOUNTER — Other Ambulatory Visit: Payer: Self-pay

## 2015-03-12 VITALS — BP 122/80 | HR 69 | Temp 98.0°F | Resp 18 | Ht 67.75 in | Wt 173.2 lb

## 2015-03-12 DIAGNOSIS — H409 Unspecified glaucoma: Secondary | ICD-10-CM

## 2015-03-12 DIAGNOSIS — E7800 Pure hypercholesterolemia, unspecified: Secondary | ICD-10-CM

## 2015-03-12 DIAGNOSIS — Z125 Encounter for screening for malignant neoplasm of prostate: Secondary | ICD-10-CM | POA: Diagnosis not present

## 2015-03-12 DIAGNOSIS — Z0000 Encounter for general adult medical examination without abnormal findings: Secondary | ICD-10-CM

## 2015-03-12 DIAGNOSIS — I10 Essential (primary) hypertension: Secondary | ICD-10-CM | POA: Diagnosis not present

## 2015-03-12 DIAGNOSIS — K219 Gastro-esophageal reflux disease without esophagitis: Secondary | ICD-10-CM

## 2015-03-12 DIAGNOSIS — G40909 Epilepsy, unspecified, not intractable, without status epilepticus: Secondary | ICD-10-CM | POA: Diagnosis not present

## 2015-03-12 DIAGNOSIS — Z86010 Personal history of colonic polyps: Secondary | ICD-10-CM

## 2015-03-12 DIAGNOSIS — Z9109 Other allergy status, other than to drugs and biological substances: Secondary | ICD-10-CM

## 2015-03-12 DIAGNOSIS — I2510 Atherosclerotic heart disease of native coronary artery without angina pectoris: Secondary | ICD-10-CM

## 2015-03-12 LAB — CBC WITH DIFFERENTIAL/PLATELET
BASOS PCT: 0.4 % (ref 0.0–3.0)
Basophils Absolute: 0 10*3/uL (ref 0.0–0.1)
EOS PCT: 2.7 % (ref 0.0–5.0)
Eosinophils Absolute: 0.1 10*3/uL (ref 0.0–0.7)
HEMATOCRIT: 43.2 % (ref 39.0–52.0)
Hemoglobin: 14.8 g/dL (ref 13.0–17.0)
LYMPHS PCT: 22 % (ref 12.0–46.0)
Lymphs Abs: 1.2 10*3/uL (ref 0.7–4.0)
MCHC: 34.4 g/dL (ref 30.0–36.0)
MCV: 86.3 fl (ref 78.0–100.0)
MONO ABS: 0.5 10*3/uL (ref 0.1–1.0)
Monocytes Relative: 8.2 % (ref 3.0–12.0)
NEUTROS ABS: 3.7 10*3/uL (ref 1.4–7.7)
Neutrophils Relative %: 66.7 % (ref 43.0–77.0)
Platelets: 259 10*3/uL (ref 150.0–400.0)
RBC: 5.01 Mil/uL (ref 4.22–5.81)
RDW: 13.5 % (ref 11.5–15.5)
WBC: 5.6 10*3/uL (ref 4.0–10.5)

## 2015-03-12 LAB — LIPID PANEL
CHOL/HDL RATIO: 5
CHOLESTEROL: 140 mg/dL (ref 0–200)
HDL: 30.5 mg/dL — AB (ref >39.00)
LDL CALC: 82 mg/dL (ref 0–99)
NonHDL: 109.5
TRIGLYCERIDES: 140 mg/dL (ref 0.0–149.0)
VLDL: 28 mg/dL (ref 0.0–40.0)

## 2015-03-12 LAB — HEPATIC FUNCTION PANEL
ALK PHOS: 72 U/L (ref 39–117)
ALT: 24 U/L (ref 0–53)
AST: 23 U/L (ref 0–37)
Albumin: 4.1 g/dL (ref 3.5–5.2)
BILIRUBIN TOTAL: 0.8 mg/dL (ref 0.2–1.2)
Bilirubin, Direct: 0.2 mg/dL (ref 0.0–0.3)
Total Protein: 7.2 g/dL (ref 6.0–8.3)

## 2015-03-12 LAB — BASIC METABOLIC PANEL
BUN: 14 mg/dL (ref 6–23)
CALCIUM: 9.2 mg/dL (ref 8.4–10.5)
CHLORIDE: 104 meq/L (ref 96–112)
CO2: 27 mEq/L (ref 19–32)
CREATININE: 1.06 mg/dL (ref 0.40–1.50)
GFR: 72.64 mL/min (ref >60.00)
Glucose, Bld: 92 mg/dL (ref 70–99)
Potassium: 3.9 mEq/L (ref 3.5–5.1)
SODIUM: 139 meq/L (ref 135–145)

## 2015-03-12 LAB — PSA, MEDICARE: PSA: 0.89 ng/ml (ref 0.10–4.00)

## 2015-03-12 MED ORDER — OMEPRAZOLE 20 MG PO CPDR: 20.0000 mg | DELAYED_RELEASE_CAPSULE | Freq: Two times a day (BID) | ORAL | Status: DC

## 2015-03-12 NOTE — Progress Notes (Signed)
Patient ID: Edwin [name], male   DOB: 1940-10-25, 74 y.o.   MRN: [id number]   Subjective:    Patient ID: RODELL MARRS, male    DOB: 1940/11/25, 74 y.o.   MRN: [id number]  HPI  Patient here to follow up on these medical issues as well as for a complete physical exam.  Increased stress with a recent move.  Discussed with him today.  They are moved now.  Unpacking.  Does not feel he needs any further intervention at this time.  Stays active.  No cardiac symptoms with increased activity or exertion.  No sob.  Eating and drinking well.  Bowels stable.     Past Medical History  Diagnosis Date  . Arthritis   . Glaucoma   . Allergy   . Hypercholesterolemia   . Seizures   . History of kidney stones   . Hx of colonic polyp     Outpatient Encounter Prescriptions as of 03/12/2015  Medication Sig  . aspirin 81 MG tablet Take 81 mg by mouth at bedtime.  . Cholecalciferol (VITAMIN D3) 2000 UNITS TABS Take by mouth every morning.  . ciclopirox (PENLAC) 8 % solution Apply topically at bedtime. Apply over nail and surrounding skin. Apply daily over previous coat. After seven (7) days, may remove with alcohol and continue cycle.  . clobetasol cream (TEMOVATE) 4.82 % Apply 1 application topically 2 (two) times daily. Do not use on face or genital area.  Do not use for more 7-10 days in the same place.  . clopidogrel (PLAVIX) 75 MG tablet Take 75 mg by mouth daily.  . colchicine 0.6 MG tablet Take 0.6 mg by mouth daily as needed.  . latanoprost (XALATAN) 0.005 % ophthalmic solution Place 1 drop into both eyes at bedtime.  . levETIRAcetam (KEPPRA) 500 MG tablet Take 500 mg by mouth 2 (two) times daily.  Marland Kitchen levocetirizine (XYZAL) 5 MG tablet Take 5 mg by mouth at bedtime as needed for allergies.  Marland Kitchen loratadine (CLARITIN) 10 MG tablet Take 10 mg by mouth daily.  . Magnesium 400 MG CAPS Take by mouth at bedtime.  . metoprolol tartrate (LOPRESSOR) 25 MG tablet Take 0.5 tablets (12.5 mg total) by mouth  at bedtime.  . Misc Natural Products (TART CHERRY ADVANCED PO) Take 500 mg by mouth every morning.  . Multiple Vitamin (MULTIVITAMIN) tablet Take 1 tablet by mouth daily.  . Omega-3 Fatty Acids (FISH OIL) 1200 MG CAPS Take by mouth 2 (two) times daily.  . polyethylene glycol (MIRALAX / GLYCOLAX) packet Take 17 g by mouth at bedtime.  . prednisoLONE acetate (PRED FORTE) 1 % ophthalmic suspension Place 1 drop into the left eye 3 (three) times daily.  . Psyllium (METAMUCIL PO) Take by mouth every morning.  . Saw Palmetto 450 MG CAPS Take by mouth every morning.  . simvastatin (ZOCOR) 40 MG tablet Take 40 mg by mouth daily.  . Triamcinolone Acetonide (NASACORT ALLERGY 24HR NA) Place 1 spray into the nose daily.  . [DISCONTINUED] fluticasone (FLONASE) 50 MCG/ACT nasal spray Place 2 sprays into both nostrils daily as needed for allergies or rhinitis.  . [DISCONTINUED] omeprazole (PRILOSEC) 20 MG capsule Take 2 capsules (40 mg total) by mouth every morning.   No facility-administered encounter medications on file as of 03/12/2015.    Review of Systems  Constitutional: Negative for appetite change and unexpected weight change.  HENT: Negative for congestion and sinus pressure.   Eyes: Negative for pain and visual disturbance.  Respiratory:  Negative for cough, chest tightness and shortness of breath.   Cardiovascular: Negative for chest pain, palpitations and leg swelling.  Gastrointestinal: Negative for nausea, vomiting, abdominal pain and diarrhea.  Genitourinary: Negative for dysuria and difficulty urinating.  Musculoskeletal: Negative for back pain and joint swelling.  Skin: Negative for color change and rash.  Neurological: Negative for dizziness, light-headedness and headaches.  Hematological: Negative for adenopathy. Does not bruise/bleed easily.  Psychiatric/Behavioral: Negative for dysphoric mood and agitation.       Objective:     Blood pressure recheck:  120/74  Physical Exam    Constitutional: He is oriented to person, place, and time. He appears well-developed and well-nourished. No distress.  HENT:  Head: Normocephalic and atraumatic.  Nose: Nose normal.  Mouth/Throat: Oropharynx is clear and moist. No oropharyngeal exudate.  Eyes: Conjunctivae are normal. Right eye exhibits no discharge. Left eye exhibits no discharge.  Neck: Neck supple. No thyromegaly present.  Cardiovascular: Normal rate and regular rhythm.   Pulmonary/Chest: Breath sounds normal. No respiratory distress. He has no wheezes.  Abdominal: Soft. Bowel sounds are normal. There is no tenderness.  Genitourinary:  Rectal exam reveals enlarged prostate.  No distinct nodule palpated.  Heme negative.    Musculoskeletal: He exhibits no edema or tenderness.  Lymphadenopathy:    He has no cervical adenopathy.  Neurological: He is alert and oriented to person, place, and time.  Skin: Skin is warm and dry. No rash noted.  Psychiatric: He has a normal mood and affect. His behavior is normal.    BP 122/80 mmHg  Pulse 69  Temp(Src) 98 F (36.7 C) (Oral)  Resp 18  Ht 5' 7.75" (1.721 m)  Wt 173 lb 4 oz (78.586 kg)  BMI 26.53 kg/m2  SpO2 97% Wt Readings from Last 3 Encounters:  03/12/15 173 lb 4 oz (78.586 kg)  11/08/14 177 lb 6 oz (80.457 kg)  04/06/14 171 lb 8 oz (77.792 kg)     Lab Results  Component Value Date   WBC 5.6 03/12/2015   HGB 14.8 03/12/2015   HCT 43.2 03/12/2015   PLT 259.0 03/12/2015   GLUCOSE 92 03/12/2015   CHOL 140 03/12/2015   TRIG 140.0 03/12/2015   HDL 30.50* 03/12/2015   LDLDIRECT 99.1 07/29/2013   LDLCALC 82 03/12/2015   ALT 24 03/12/2015   AST 23 03/12/2015   NA 139 03/12/2015   K 3.9 03/12/2015   CL 104 03/12/2015   CREATININE 1.06 03/12/2015   BUN 14 03/12/2015   CO2 27 03/12/2015   TSH 2.16 11/08/2014   PSA 0.89 03/12/2015       Assessment & Plan:   Problem List Items Addressed This Visit    CAD (coronary artery disease)    Heart  catheterization 01/26/14 as outlined - overview.  S/p stent placement.  Continue risk factor modification.        Environmental allergies    Controlled on current regimen.        Essential hypertension, benign - Primary    Blood pressure under good control.  Continue same medication.  Follow.  Met b and pressures.        Relevant Orders   Basic metabolic panel (Completed)   GERD (gastroesophageal reflux disease)    Controlled on omeprazole.        Glaucoma    Seeing opthalmology.        Health care maintenance    Physical today 7/11/6.  Check  psa with next labs.  Colonoscopy 2008.  Unable to have f/u colonoscopy since on blood thinner.  Plans to discuss with cardiology at next appt - about planned colonoscopy.        History of colonic polyps   Hypercholesterolemia    On simvastatin.  Low cholesterol diet and exercise.  Follow lipid panel and liver function tests.        Relevant Orders   Lipid panel (Completed)   Seizure disorder    Followed by Dr Manuella Ghazi.  On Keppra.        Relevant Orders   Hepatic function panel (Completed)   CBC with Differential/Platelet (Completed)    Other Visit Diagnoses    Prostate cancer screening        Relevant Orders    PSA, Medicare (Completed)        Einar Pheasant, MD

## 2015-03-12 NOTE — Progress Notes (Signed)
Pre visit review using our clinic review tool, if applicable. No additional management support is needed unless otherwise documented below in the visit note. 

## 2015-03-12 NOTE — Telephone Encounter (Signed)
Pt needs refill on Omeprazole 20mg  twice a  Day. Sent to  [facility]  rx

## 2015-03-12 NOTE — Telephone Encounter (Signed)
Spoke with wife, to let her know that the prescription has been refilled!

## 2015-03-14 NOTE — Telephone Encounter (Signed)
Unread mychart message mailed to patient 

## 2015-03-18 ENCOUNTER — Encounter: Payer: Self-pay | Admitting: Internal Medicine

## 2015-03-18 NOTE — Assessment & Plan Note (Signed)
Controlled on omeprazole.   

## 2015-03-18 NOTE — Assessment & Plan Note (Signed)
On simvastatin.  Low cholesterol diet and exercise.  Follow lipid panel and liver function tests.   

## 2015-03-18 NOTE — Assessment & Plan Note (Signed)
Blood pressure under good control.  Continue same medication.  Follow.  Met b and pressures.

## 2015-03-18 NOTE — Assessment & Plan Note (Signed)
Followed by Dr Manuella Ghazi.  On Keppra.

## 2015-03-18 NOTE — Assessment & Plan Note (Signed)
Controlled on current regimen.   

## 2015-03-18 NOTE — Assessment & Plan Note (Signed)
Physical today 7/11/6.  Check  psa with next labs.  Colonoscopy 2008.  Unable to have f/u colonoscopy since on blood thinner.  Plans to discuss with cardiology at next appt - about planned colonoscopy.

## 2015-03-18 NOTE — Assessment & Plan Note (Signed)
Seeing opthalmology.    

## 2015-03-18 NOTE — Assessment & Plan Note (Signed)
Heart catheterization 01/26/14 as outlined - overview.  S/p stent placement.  Continue risk factor modification.

## 2015-04-16 DIAGNOSIS — H40053 Ocular hypertension, bilateral: Secondary | ICD-10-CM | POA: Diagnosis not present

## 2015-04-23 DIAGNOSIS — H40053 Ocular hypertension, bilateral: Secondary | ICD-10-CM | POA: Diagnosis not present

## 2015-05-14 DIAGNOSIS — E780 Pure hypercholesterolemia: Secondary | ICD-10-CM | POA: Diagnosis not present

## 2015-05-14 DIAGNOSIS — I10 Essential (primary) hypertension: Secondary | ICD-10-CM | POA: Diagnosis not present

## 2015-05-14 DIAGNOSIS — I2510 Atherosclerotic heart disease of native coronary artery without angina pectoris: Secondary | ICD-10-CM | POA: Diagnosis not present

## 2015-05-14 DIAGNOSIS — I25700 Atherosclerosis of coronary artery bypass graft(s), unspecified, with unstable angina pectoris: Secondary | ICD-10-CM | POA: Diagnosis not present

## 2015-07-18 ENCOUNTER — Ambulatory Visit (INDEPENDENT_AMBULATORY_CARE_PROVIDER_SITE_OTHER): Payer: Commercial Managed Care - HMO

## 2015-07-18 DIAGNOSIS — Z23 Encounter for immunization: Secondary | ICD-10-CM

## 2015-07-18 NOTE — Progress Notes (Signed)
Patient came in for Flu and Pneumo vaccines.  See documentation for details.

## 2015-07-19 ENCOUNTER — Other Ambulatory Visit: Payer: Self-pay | Admitting: Internal Medicine

## 2015-08-03 ENCOUNTER — Encounter: Payer: Self-pay | Admitting: Internal Medicine

## 2015-08-03 DIAGNOSIS — Z0000 Encounter for general adult medical examination without abnormal findings: Secondary | ICD-10-CM

## 2015-08-03 DIAGNOSIS — G40909 Epilepsy, unspecified, not intractable, without status epilepticus: Secondary | ICD-10-CM

## 2015-08-03 NOTE — Telephone Encounter (Signed)
Orders placed for referrals.  See pts my chart message.  Pt want to be notified when referrals done.

## 2015-08-27 ENCOUNTER — Other Ambulatory Visit: Payer: Self-pay | Admitting: Internal Medicine

## 2015-09-12 ENCOUNTER — Ambulatory Visit: Payer: Commercial Managed Care - HMO | Admitting: Internal Medicine

## 2015-09-13 ENCOUNTER — Encounter: Payer: Self-pay | Admitting: Internal Medicine

## 2015-09-13 ENCOUNTER — Ambulatory Visit (INDEPENDENT_AMBULATORY_CARE_PROVIDER_SITE_OTHER): Payer: Commercial Managed Care - HMO | Admitting: Internal Medicine

## 2015-09-13 VITALS — BP 100/64 | HR 58 | Temp 97.7°F | Resp 18 | Ht 67.75 in | Wt 180.5 lb

## 2015-09-13 DIAGNOSIS — E7800 Pure hypercholesterolemia, unspecified: Secondary | ICD-10-CM | POA: Diagnosis not present

## 2015-09-13 DIAGNOSIS — I10 Essential (primary) hypertension: Secondary | ICD-10-CM | POA: Diagnosis not present

## 2015-09-13 DIAGNOSIS — R5383 Other fatigue: Secondary | ICD-10-CM

## 2015-09-13 DIAGNOSIS — I2510 Atherosclerotic heart disease of native coronary artery without angina pectoris: Secondary | ICD-10-CM | POA: Diagnosis not present

## 2015-09-13 DIAGNOSIS — G4710 Hypersomnia, unspecified: Secondary | ICD-10-CM

## 2015-09-13 DIAGNOSIS — K219 Gastro-esophageal reflux disease without esophagitis: Secondary | ICD-10-CM

## 2015-09-13 DIAGNOSIS — Z1211 Encounter for screening for malignant neoplasm of colon: Secondary | ICD-10-CM

## 2015-09-13 DIAGNOSIS — G40909 Epilepsy, unspecified, not intractable, without status epilepticus: Secondary | ICD-10-CM

## 2015-09-13 DIAGNOSIS — R400 Somnolence: Secondary | ICD-10-CM

## 2015-09-13 DIAGNOSIS — R0683 Snoring: Secondary | ICD-10-CM

## 2015-09-13 LAB — BASIC METABOLIC PANEL
BUN: 17 mg/dL (ref 6–23)
CALCIUM: 9.3 mg/dL (ref 8.4–10.5)
CO2: 26 mEq/L (ref 19–32)
CREATININE: 1.08 mg/dL (ref 0.40–1.50)
Chloride: 107 mEq/L (ref 96–112)
GFR: 70.99 mL/min (ref >60.00)
GLUCOSE: 106 mg/dL — AB (ref 70–99)
Potassium: 4.2 mEq/L (ref 3.5–5.1)
SODIUM: 140 meq/L (ref 135–145)

## 2015-09-13 LAB — LIPID PANEL
CHOLESTEROL: 150 mg/dL (ref 0–200)
HDL: 33.1 mg/dL — ABNORMAL LOW (ref >39.00)
LDL Cholesterol: 91 mg/dL (ref 0–99)
NonHDL: 117.09
TRIGLYCERIDES: 131 mg/dL (ref 0.0–149.0)
Total CHOL/HDL Ratio: 5
VLDL: 26.2 mg/dL (ref 0.0–40.0)

## 2015-09-13 LAB — HEPATIC FUNCTION PANEL
ALK PHOS: 57 U/L (ref 39–117)
ALT: 27 U/L (ref 0–53)
AST: 26 U/L (ref 0–37)
Albumin: 4.2 g/dL (ref 3.5–5.2)
BILIRUBIN DIRECT: 0.2 mg/dL (ref 0.0–0.3)
BILIRUBIN TOTAL: 1.2 mg/dL (ref 0.2–1.2)
Total Protein: 6.8 g/dL (ref 6.0–8.3)

## 2015-09-13 LAB — TSH: TSH: 1.66 u[IU]/mL (ref 0.35–4.50)

## 2015-09-13 NOTE — Progress Notes (Signed)
Patient ID: Edwin [name], male   DOB: [birth year], 74 y.o.   MRN: [id number]   Subjective:    Patient ID: Edwin [name], male    DOB: 11/26/1940, 75 y.o.   MRN: [id number]  HPI  Patient with past history of hypercholesterolemia and allergies.  He comes in today to follow up on these issues.  No headache.  No chest pain or tightness.  No sob.  No acid reflux.  No abdominal pain or cramping.  Bowels stable.  Persistent foot lesion.  Has appt with dermatology.  Is planning to ask him to look at the lesion.  He also reports snoring.  Is not rested when wakes up.  Increased daytime somnolence.  Can fall asleep easily during the day.  Due colonoscopy.  Saw cardiology.  Felt things were stable.     Past Medical History  Diagnosis Date  . Arthritis   . Glaucoma   . Allergy   . Hypercholesterolemia   . Seizures ([place])   . History of kidney stones   . Hx of colonic polyp    Past Surgical History  Procedure Laterality Date  . Hernia repair  2008 & 2012  . Eye surgery  07/20/13    cataract  . Vasectomy  1984   Family History  Problem Relation Age of Onset  . Multiple sclerosis Sister   . Arthritis Mother   . Heart disease Mother   . Stroke Mother   . Hypertension Mother   . Arthritis Father   . Hyperlipidemia Father   . Heart disease Father   . Hypertension Father   . Parkinson's disease Father   . Colon cancer Maternal Grandmother   . Arthritis Paternal Grandmother   . Colon cancer Paternal Grandmother    Social History   Social History  . Marital Status: Married    Spouse Name: N/A  . Number of Children: N/A  . Years of Education: N/A   Social History Main Topics  . Smoking status: Never Smoker   . Smokeless tobacco: Never Used  . Alcohol Use: No  . Drug Use: No  . Sexual Activity: Not Asked   Other Topics Concern  . None   Social History Narrative    Outpatient Encounter Prescriptions as of 09/13/2015  Medication Sig  . aspirin 81 MG tablet Take 81 mg by  mouth at bedtime.  . Cholecalciferol (VITAMIN D3) 2000 UNITS TABS Take by mouth every morning.  . ciclopirox (PENLAC) 8 % solution Apply topically at bedtime. Apply over nail and surrounding skin. Apply daily over previous coat. After seven (7) days, may remove with alcohol and continue cycle.  . clobetasol cream (TEMOVATE) [id number] % Apply 1 application topically 2 (two) times daily. Do not use on face or genital area.  Do not use for more 7-10 days in the same place.  . clopidogrel (PLAVIX) 75 MG tablet Take 75 mg by mouth daily.  . colchicine 0.6 MG tablet Take 0.6 mg by mouth daily as needed.  . latanoprost (XALATAN) 0.005 % ophthalmic solution Place 1 drop into both eyes at bedtime.  . levETIRAcetam (KEPPRA) 500 MG tablet Take 500 mg by mouth 2 (two) times daily.  Marland Kitchen levocetirizine (XYZAL) 5 MG tablet Take 5 mg by mouth at bedtime as needed for allergies.  Marland Kitchen loratadine (CLARITIN) 10 MG tablet Take 10 mg by mouth daily.  . Magnesium 400 MG CAPS Take by mouth at bedtime.  . metoprolol tartrate (LOPRESSOR) 25 MG tablet TAKE 1/2 TABLET  AT BEDTIME  . Misc Natural Products (TART CHERRY ADVANCED PO) Take 500 mg by mouth every morning.  . Multiple Vitamin (MULTIVITAMIN) tablet Take 1 tablet by mouth daily.  . Omega-3 Fatty Acids (FISH OIL) 1200 MG CAPS Take by mouth 2 (two) times daily.  Marland Kitchen omeprazole (PRILOSEC) 20 MG capsule TAKE 1 CAPSULE TWICE DAILY BEFORE MEALS  . polyethylene glycol (MIRALAX / GLYCOLAX) packet Take 17 g by mouth at bedtime.  . prednisoLONE acetate (PRED FORTE) 1 % ophthalmic suspension Place 1 drop into the left eye 3 (three) times daily.  . Psyllium (METAMUCIL PO) Take by mouth every morning.  . Saw Palmetto 450 MG CAPS Take by mouth every morning.  . simvastatin (ZOCOR) 40 MG tablet Take 40 mg by mouth daily.  . Triamcinolone Acetonide (NASACORT ALLERGY 24HR NA) Place 1 spray into the nose daily.   No facility-administered encounter medications on file as of 09/13/2015.     Review of Systems  Constitutional: Positive for fatigue. Negative for appetite change and unexpected weight change.  HENT: Negative for congestion and sinus pressure.   Eyes: Negative for discharge and redness.  Respiratory: Negative for cough, chest tightness and shortness of breath.   Cardiovascular: Negative for chest pain, palpitations and leg swelling.  Gastrointestinal: Negative for nausea, vomiting, abdominal pain and diarrhea.  Genitourinary: Negative for dysuria and difficulty urinating.  Musculoskeletal: Negative for back pain and joint swelling.  Skin: Negative for color change and rash.  Neurological: Negative for dizziness, light-headedness and headaches.  Psychiatric/Behavioral: Negative for dysphoric mood and agitation.       Objective:    Physical Exam  Constitutional: He appears well-developed and well-nourished. No distress.  HENT:  Nose: Nose normal.  Mouth/Throat: Oropharynx is clear and moist.  Eyes: Conjunctivae are normal. Right eye exhibits no discharge. Left eye exhibits no discharge.  Neck: Neck supple. No thyromegaly present.  Cardiovascular: Normal rate and regular rhythm.   Pulmonary/Chest: Effort normal and breath sounds normal. No respiratory distress.  Abdominal: Soft. Bowel sounds are normal. There is no tenderness.  Musculoskeletal: He exhibits no edema or tenderness.  Lymphadenopathy:    He has no cervical adenopathy.  Skin: No rash noted. No erythema.  Psychiatric: He has a normal mood and affect. His behavior is normal.    BP 100/64 mmHg  Pulse 58  Temp(Src) 97.7 F (36.5 C) (Oral)  Resp 18  Ht 5' 7.75" (1.721 m)  Wt 180 lb 8 oz (81.874 kg)  BMI 27.64 kg/m2  SpO2 97% Wt Readings from Last 3 Encounters:  09/13/15 180 lb 8 oz (81.874 kg)  03/12/15 173 lb 4 oz (78.586 kg)  11/08/14 177 lb 6 oz (80.457 kg)     Lab Results  Component Value Date   WBC 5.6 03/12/2015   HGB 14.8 03/12/2015   HCT 43.2 03/12/2015   PLT 259.0  03/12/2015   GLUCOSE 106* 09/13/2015   CHOL 150 09/13/2015   TRIG 131.0 09/13/2015   HDL 33.10* 09/13/2015   LDLDIRECT 99.1 07/29/2013   LDLCALC 91 09/13/2015   ALT 27 09/13/2015   AST 26 09/13/2015   NA 140 09/13/2015   K 4.2 09/13/2015   CL 107 09/13/2015   CREATININE 1.08 09/13/2015   BUN 17 09/13/2015   CO2 26 09/13/2015   TSH 1.66 09/13/2015   PSA 0.89 03/12/2015       Assessment & Plan:   Problem List Items Addressed This Visit    CAD (coronary artery disease)    Had  catheterization as outlined overview.  S/p stent replacement mid LAD.  Sees cardiology.  Follow.  Currently doing well.        Essential hypertension, benign - Primary    Blood pressure has been under good control.  Follow pressures.  Follow metabolic panel.       Relevant Orders   Basic metabolic panel (Completed)   Fatigue    Reports fatigue and increased daytime somnolence.  Snores.  Wakes up tired.  Schedule for split night sleep study.        Relevant Orders   TSH (Completed)   Ambulatory referral to Sleep Studies   GERD (gastroesophageal reflux disease)    Controlled omeprazole.        Hypercholesterolemia    On simvastatin.  Low cholesterol diet and exercise.  Follow lipid panel and liver function tests.        Relevant Orders   Lipid panel (Completed)   Hepatic function panel (Completed)   Seizure disorder ([place])    Followed by Dr Manuella Ghazi.  On Keppra.        Other Visit Diagnoses    Snoring        Relevant Orders    Ambulatory referral to Sleep Studies    Daytime somnolence        Relevant Orders    Ambulatory referral to Sleep Studies    Colon cancer screening        Relevant Orders    Ambulatory referral to Gastroenterology        Einar Pheasant, MD

## 2015-09-13 NOTE — Progress Notes (Signed)
Pre-visit discussion using our clinic review tool. No additional management support is needed unless otherwise documented below in the visit note.  

## 2015-09-14 ENCOUNTER — Encounter: Payer: Self-pay | Admitting: *Deleted

## 2015-09-14 ENCOUNTER — Other Ambulatory Visit: Payer: Self-pay | Admitting: Internal Medicine

## 2015-09-14 DIAGNOSIS — R739 Hyperglycemia, unspecified: Secondary | ICD-10-CM

## 2015-09-14 NOTE — Progress Notes (Signed)
Order placed for f/u glucose and a1c.

## 2015-09-16 ENCOUNTER — Encounter: Payer: Self-pay | Admitting: Internal Medicine

## 2015-09-16 NOTE — Assessment & Plan Note (Signed)
Followed by Dr Shah.  On Keppra.   

## 2015-09-16 NOTE — Assessment & Plan Note (Signed)
Controlled omeprazole.

## 2015-09-16 NOTE — Assessment & Plan Note (Signed)
On simvastatin.  Low cholesterol diet and exercise.  Follow lipid panel and liver function tests.   

## 2015-09-16 NOTE — Assessment & Plan Note (Signed)
Blood pressure has been under good control.  Follow pressures.  Follow metabolic panel.  

## 2015-09-16 NOTE — Assessment & Plan Note (Signed)
Had catheterization as outlined overview.  S/p stent replacement mid LAD.  Sees cardiology.  Follow.  Currently doing well.

## 2015-09-16 NOTE — Assessment & Plan Note (Signed)
Reports fatigue and increased daytime somnolence.  Snores.  Wakes up tired.  Schedule for split night sleep study.

## 2015-09-21 DIAGNOSIS — G4719 Other hypersomnia: Secondary | ICD-10-CM | POA: Diagnosis not present

## 2015-09-21 DIAGNOSIS — G40209 Localization-related (focal) (partial) symptomatic epilepsy and epileptic syndromes with complex partial seizures, not intractable, without status epilepticus: Secondary | ICD-10-CM | POA: Diagnosis not present

## 2015-09-21 DIAGNOSIS — G4710 Hypersomnia, unspecified: Secondary | ICD-10-CM | POA: Diagnosis not present

## 2015-09-28 ENCOUNTER — Telehealth: Payer: Self-pay

## 2015-09-28 DIAGNOSIS — M542 Cervicalgia: Secondary | ICD-10-CM

## 2015-09-28 NOTE — Telephone Encounter (Signed)
Please confirm that needs just c-spine xray ordered and let me know.  Thank

## 2015-09-28 NOTE — Telephone Encounter (Signed)
[facility] called and stated that the pt needs orders for Neck xray.

## 2015-10-01 ENCOUNTER — Encounter: Payer: Self-pay | Admitting: Internal Medicine

## 2015-10-01 DIAGNOSIS — I2510 Atherosclerotic heart disease of native coronary artery without angina pectoris: Secondary | ICD-10-CM

## 2015-10-02 ENCOUNTER — Encounter: Payer: Self-pay | Admitting: *Deleted

## 2015-10-02 NOTE — Telephone Encounter (Signed)
Please place a referral to Cardiology at [facility] Dr. Ubaldo Glassing for patient.

## 2015-10-02 NOTE — Telephone Encounter (Signed)
Pt states he was having discomfort in neck, from base of the back of his skull on down his neck. Pain level is about a 3, mostly in pain when he wakes up. When I called [facility] radiology they stated that you could order a C spine if you thought it was necessary. Please advise

## 2015-10-02 NOTE — Telephone Encounter (Signed)
Order placed for cardiology referral.   

## 2015-10-02 NOTE — Telephone Encounter (Signed)
I have placed the order for the c-spine xray.  Please notify pt he can [facility] for [facility]ray.  Thanks

## 2015-10-02 NOTE — Telephone Encounter (Signed)
Notified the pt of Dr. Scott's comments 

## 2015-10-09 ENCOUNTER — Ambulatory Visit: Payer: Commercial Managed Care - HMO | Attending: Neurology

## 2015-10-09 DIAGNOSIS — G4761 Periodic limb movement disorder: Secondary | ICD-10-CM | POA: Diagnosis not present

## 2015-10-09 DIAGNOSIS — G4733 Obstructive sleep apnea (adult) (pediatric): Secondary | ICD-10-CM | POA: Diagnosis not present

## 2015-10-09 NOTE — Telephone Encounter (Signed)
Pt scheduled for sleep study with [facility] today

## 2015-10-10 ENCOUNTER — Ambulatory Visit
Admission: RE | Admit: 2015-10-10 | Discharge: 2015-10-10 | Disposition: A | Discharge status: Home / Self Care | Payer: Commercial Managed Care - HMO | Source: Ambulatory Visit | Attending: Internal Medicine | Admitting: Internal Medicine

## 2015-10-10 DIAGNOSIS — M542 Cervicalgia: Secondary | ICD-10-CM

## 2015-10-10 DIAGNOSIS — M436 Torticollis: Secondary | ICD-10-CM | POA: Diagnosis not present

## 2015-10-11 DIAGNOSIS — G4733 Obstructive sleep apnea (adult) (pediatric): Secondary | ICD-10-CM | POA: Diagnosis not present

## 2015-10-11 DIAGNOSIS — G4761 Periodic limb movement disorder: Secondary | ICD-10-CM | POA: Diagnosis not present

## 2015-10-12 ENCOUNTER — Ambulatory Visit (INDEPENDENT_AMBULATORY_CARE_PROVIDER_SITE_OTHER): Payer: Commercial Managed Care - HMO

## 2015-10-12 ENCOUNTER — Encounter: Payer: Self-pay | Admitting: Internal Medicine

## 2015-10-12 ENCOUNTER — Other Ambulatory Visit (INDEPENDENT_AMBULATORY_CARE_PROVIDER_SITE_OTHER): Payer: Commercial Managed Care - HMO

## 2015-10-12 VITALS — BP 120/76 | HR 60 | Temp 97.7°F | Ht 68.5 in | Wt 178.0 lb

## 2015-10-12 DIAGNOSIS — R739 Hyperglycemia, unspecified: Secondary | ICD-10-CM | POA: Diagnosis not present

## 2015-10-12 DIAGNOSIS — Z0000 Encounter for general adult medical examination without abnormal findings: Secondary | ICD-10-CM | POA: Diagnosis not present

## 2015-10-12 DIAGNOSIS — R7309 Other abnormal glucose: Secondary | ICD-10-CM | POA: Diagnosis not present

## 2015-10-12 LAB — HEMOGLOBIN A1C: HEMOGLOBIN A1C: 5.4 % (ref 4.6–6.5)

## 2015-10-12 NOTE — Progress Notes (Signed)
Subjective:   Edwin [name] is a 75 y.o. male who presents for an Initial Medicare Annual Wellness Visit.  Review of Systems  No ROS.  Medicare Wellness Visit.  Cardiac Risk Factors include: advanced age (>16men, >48 women);hypertension;male gender    Objective:    Today's Vitals   10/12/15 1006  BP: 120/76  Pulse: 60  Temp: 97.7 F (36.5 C)  TempSrc: Oral  Height: 5' 8.5" (1.74 m)  Weight: 178 lb (80.74 kg)  SpO2: 99%  PainSc: 0-No pain    Current Medications (verified) Outpatient Encounter Prescriptions as of 10/12/2015  Medication Sig  . aspirin 81 MG tablet Take 81 mg by mouth at bedtime.  . Cholecalciferol (VITAMIN D3) 2000 UNITS TABS Take by mouth every morning.  . ciclopirox (PENLAC) 8 % solution Apply topically at bedtime. Apply over nail and surrounding skin. Apply daily over previous coat. After seven (7) days, may remove with alcohol and continue cycle.  . clobetasol cream (TEMOVATE) AB-123456789 % Apply 1 application topically 2 (two) times daily. Do not use on face or genital area.  Do not use for more 7-10 days in the same place.  . clopidogrel (PLAVIX) 75 MG tablet Take 75 mg by mouth daily.  . colchicine 0.6 MG tablet Take 0.6 mg by mouth daily as needed.  . latanoprost (XALATAN) 0.005 % ophthalmic solution Place 1 drop into both eyes at bedtime.  . levETIRAcetam (KEPPRA) 500 MG tablet Take 500 mg by mouth 2 (two) times daily.  Marland Kitchen levocetirizine (XYZAL) 5 MG tablet Take 5 mg by mouth at bedtime as needed for allergies.  Marland Kitchen loratadine (CLARITIN) 10 MG tablet Take 10 mg by mouth daily.  . Magnesium 400 MG CAPS Take by mouth at bedtime.  . metoprolol tartrate (LOPRESSOR) 25 MG tablet TAKE 1/2 TABLET AT BEDTIME  . Misc Natural Products (TART CHERRY ADVANCED PO) Take 500 mg by mouth every morning.  . Multiple Vitamin (MULTIVITAMIN) tablet Take 1 tablet by mouth daily.  . Omega-3 Fatty Acids (FISH OIL) 1200 MG CAPS Take by mouth 2 (two) times daily.  Marland Kitchen omeprazole  (PRILOSEC) 20 MG capsule TAKE 1 CAPSULE TWICE DAILY BEFORE MEALS  . polyethylene glycol (MIRALAX / GLYCOLAX) packet Take 17 g by mouth at bedtime.  . prednisoLONE acetate (PRED FORTE) 1 % ophthalmic suspension Place 1 drop into the left eye 3 (three) times daily.  . Psyllium (METAMUCIL PO) Take by mouth every morning.  . Saw Palmetto 450 MG CAPS Take by mouth every morning.  . simvastatin (ZOCOR) 40 MG tablet Take 40 mg by mouth daily.  . Triamcinolone Acetonide (NASACORT ALLERGY 24HR NA) Place 1 spray into the nose daily.   No facility-administered encounter medications on file as of 10/12/2015.    Allergies (verified) Penicillins and Sulfa antibiotics   History: Past Medical History  Diagnosis Date  . Arthritis   . Glaucoma   . Allergy   . Hypercholesterolemia   . Seizures ([place])   . History of kidney stones   . Hx of colonic polyp    Past Surgical History  Procedure Laterality Date  . Hernia repair  2008 & 2012  . Eye surgery  07/20/13    cataract  . Vasectomy  1984   Family History  Problem Relation Age of Onset  . Multiple sclerosis Sister   . Arthritis Mother   . Heart disease Mother   . Stroke Mother   . Hypertension Mother   . Arthritis Father   . Hyperlipidemia  Father   . Heart disease Father   . Hypertension Father   . Parkinson's disease Father   . Colon cancer Maternal Grandmother   . Arthritis Paternal Grandmother   . Colon cancer Paternal Grandmother    Social History   Occupational History  . Not on file.   Social History Main Topics  . Smoking status: Never Smoker   . Smokeless tobacco: Never Used  . Alcohol Use: No  . Drug Use: No  . Sexual Activity: Yes   Tobacco Counseling Counseling given: Not Answered   Activities of Daily Living In your present state of health, do you have any difficulty performing the following activities: 10/12/2015  Hearing? Y  Vision? N  Difficulty concentrating or making decisions? N  Walking or climbing  stairs? N  Dressing or bathing? N  Doing errands, shopping? N  Preparing Food and eating ? N  Using the Toilet? N  In the past six months, have you accidently leaked urine? N  Do you have problems with loss of bowel control? N  Managing your Medications? N  Managing your Finances? N  Housekeeping or managing your Housekeeping? N    Immunizations and Health Maintenance Immunization History  Administered Date(s) Administered  . Influenza,inj,Quad PF,36+ Mos 07/29/2013, 06/20/2014, 07/18/2015  . Pneumococcal Conjugate-13 08/09/2013  . Pneumococcal Polysaccharide-23 07/18/2015  . Tdap 09/01/2004  . Zoster 08/29/2013   Health Maintenance Due  Topic Date Due  . COLONOSCOPY  06/12/1991  . TETANUS/TDAP  09/01/2014    Patient Care Team: Einar Pheasant, MD as PCP - General (Internal Medicine)  Indicate any recent Medical Services you may have received from other than [facility] providers in the past year (date may be approximate).    Assessment:   This is a routine wellness examination for [place]. The goal of the wellness visit is to assist the patient how to close the gaps in care and create a preventative care plan for the patient.   Taking Vitamin D3 as appropriate/Osteoporosis risk reviewed.  Medications reviewed; taking without issues or barriers.  Safety issues reviewed; smoke detectors in the home. Firearms locked in a secure area in the home. Wears seatbelts when driving or riding with others. No violence in the home.  No identified risk were noted; The patient was oriented x 3; appropriate in dress and manner and no objective failures at ADL's or IADL's.    TDAP is to be followed up with prior record.  Patient Concerns: Neck XRAY completed and awaiting results.  Colonoscopy order confirmed with GI.  Follow up with record of most recent TD/TDAP vaccine.    Hearing/Vision screen Hearing Screening Comments: Reports Hx of L ear damage, difficulty hearing. R ear no  difficulty hearing. No auditory referral placed at this time.  Vision Screening Comments: Followed by Dr Wallace Going  Annual visits/Last [place] 04/2015 Bilateral cataracts extracted Wears reading glasses only  Dietary issues and exercise activities discussed: Current Exercise Habits:: Home exercise routine, Type of exercise: walking, Time (Minutes): 35, Frequency (Times/Week): 6, Weekly Exercise (Minutes/Week): 210, Intensity: Mild  Goals    . Exercise 5x per week (53min per time)     Patient centered goal is to walk up to 90 min daily, 5 days per week to reach target weight of 165 lbs, weather permitting.       Depression Screen PHQ 2/9 Scores 10/12/2015 03/12/2015 12/15/2013 07/29/2013  PHQ - 2 Score 0 0 0 0    Fall Risk Fall Risk  10/12/2015 03/12/2015 12/15/2013 07/29/2013  Falls in the past year? No No No No    Cognitive Function: MMSE - Mini Mental State Exam 10/12/2015  Orientation to time 5  Orientation to Place 5  Registration 3  Attention/ Calculation 5  Recall 3  Language- name 2 objects 2  Language- repeat 1  Language- follow 3 step command 3  Language- read & follow direction 1  Write a sentence 1  Copy design 1  Total score 30    Screening Tests Health Maintenance  Topic Date Due  . COLONOSCOPY  06/12/1991  . TETANUS/TDAP  09/01/2014  . INFLUENZA VACCINE  04/01/2016  . ZOSTAVAX  Completed  . PNA vac Low Risk Adult  Completed        Plan:   End of life planning; Advance aging; Advanced directives discussed. Educational material provided for [employer].   During the course of the visit [place] was educated and counseled about the following appropriate screening and preventive services:   Vaccines to include Pneumoccal, Influenza, Hepatitis B, Td, Zostavax, HCV  Electrocardiogram  Colorectal cancer screening  Cardiovascular disease screening  Diabetes screening  Glaucoma screening  Nutrition counseling  Prostate cancer  screening  Smoking cessation counseling  Patient Instructions (the written plan) were given to the patient.   Varney Biles, LPN   [phone]    Reviewed above information.  Agree with plan.   Dr Nicki Reaper

## 2015-10-12 NOTE — Patient Instructions (Addendum)
[name] ,  Thank you for taking time to come for your Medicare Wellness Visit. I appreciate your ongoing commitment to your health goals. Please review the following plan we discussed and let me know if I can assist you in the future.        This is a list of the screening recommended for you and due dates:  Health Maintenance  Topic Date Due  . Colon Cancer Screening  06/12/1991  . Tetanus Vaccine  09/01/2014  . Flu Shot  04/01/2016  . Shingles Vaccine  Completed  . Pneumonia vaccines  Completed   Colonoscopy as directed.   Please provide record of most recent TD/TDAP (tetanus vaccine).   Follow up with Dr. Nicki Reaper as needed.   Colonoscopy A colonoscopy is an exam to look at the entire large intestine (colon). This exam can help find problems such as tumors, polyps, inflammation, and areas of bleeding. The exam takes about 1 hour.  LET [facility] CARE PROVIDER KNOW ABOUT:   Any allergies you have.  All medicines you are taking, including vitamins, herbs, eye drops, creams, and over-the-counter medicines.  Previous problems you or members of your family have had with the use of anesthetics.  Any blood disorders you have.  Previous surgeries you have had.  Medical conditions you have. RISKS AND COMPLICATIONS  Generally, this is a safe procedure. However, as with any procedure, complications can occur. Possible complications include:  Bleeding.  Tearing or rupture of the colon wall.  Reaction to medicines given during the exam.  Infection (rare). BEFORE THE PROCEDURE   Ask your health care provider about changing or stopping your regular medicines.  You may be prescribed an oral bowel prep. This involves drinking a large amount of medicated liquid, starting the day before your procedure. The liquid will cause you to have multiple loose stools until your stool is almost clear or light green. This cleans out your colon in preparation for the procedure.  Do not  eat or drink anything else once you have started the bowel prep, unless your health care provider tells you it is safe to do so.  Arrange for someone to drive you home after the procedure. PROCEDURE   You will be given medicine to help you relax (sedative).  You will lie on your side with your knees bent.  A long, flexible tube with a light and camera on the end (colonoscope) will be inserted through the rectum and into the colon. The camera sends video back to a computer screen as it moves through the colon. The colonoscope also releases carbon dioxide gas to inflate the colon. This helps your health care provider see the area better.  During the exam, your health care provider may take a small tissue sample (biopsy) to be examined under a microscope if any abnormalities are found.  The exam is finished when the entire colon has been viewed. AFTER THE PROCEDURE   Do not drive for 24 hours after the exam.  You may have a small amount of blood in your stool.  You may pass moderate amounts of gas and have mild abdominal cramping or bloating. This is caused by the gas used to inflate your colon during the exam.  Ask when your test results will be ready and how you will get your results. Make sure you get your test results.   This information is not intended to replace advice given to you by your health care provider. Make sure you discuss  any questions you have with your health care provider.   Document Released: 08/15/2000 Document Revised: 06/08/2013 Document Reviewed: 04/25/2013 Elsevier Interactive Patient Education [employer].

## 2015-10-13 ENCOUNTER — Encounter: Payer: Self-pay | Admitting: Internal Medicine

## 2015-10-13 LAB — GLUCOSE, FASTING: Glucose, Fasting: 88 mg/dL (ref 65–99)

## 2015-10-16 NOTE — Telephone Encounter (Signed)
Unread mychart message mailed to patient 

## 2015-10-22 DIAGNOSIS — H40053 Ocular hypertension, bilateral: Secondary | ICD-10-CM | POA: Diagnosis not present

## 2015-10-31 ENCOUNTER — Other Ambulatory Visit: Payer: Self-pay | Admitting: Internal Medicine

## 2015-11-14 DIAGNOSIS — I10 Essential (primary) hypertension: Secondary | ICD-10-CM | POA: Diagnosis not present

## 2015-11-14 DIAGNOSIS — I2510 Atherosclerotic heart disease of native coronary artery without angina pectoris: Secondary | ICD-10-CM | POA: Diagnosis not present

## 2015-11-14 DIAGNOSIS — Z9861 Coronary angioplasty status: Secondary | ICD-10-CM | POA: Diagnosis not present

## 2015-11-14 DIAGNOSIS — E7800 Pure hypercholesterolemia, unspecified: Secondary | ICD-10-CM | POA: Diagnosis not present

## 2015-11-14 DIAGNOSIS — R0789 Other chest pain: Secondary | ICD-10-CM | POA: Diagnosis not present

## 2015-11-29 DIAGNOSIS — B356 Tinea cruris: Secondary | ICD-10-CM | POA: Diagnosis not present

## 2015-11-29 DIAGNOSIS — L304 Erythema intertrigo: Secondary | ICD-10-CM | POA: Diagnosis not present

## 2015-11-29 DIAGNOSIS — Z1283 Encounter for screening for malignant neoplasm of skin: Secondary | ICD-10-CM | POA: Diagnosis not present

## 2015-11-29 DIAGNOSIS — L578 Other skin changes due to chronic exposure to nonionizing radiation: Secondary | ICD-10-CM | POA: Diagnosis not present

## 2015-11-29 DIAGNOSIS — L812 Freckles: Secondary | ICD-10-CM | POA: Diagnosis not present

## 2015-11-29 DIAGNOSIS — D229 Melanocytic nevi, unspecified: Secondary | ICD-10-CM | POA: Diagnosis not present

## 2015-11-29 DIAGNOSIS — L570 Actinic keratosis: Secondary | ICD-10-CM | POA: Diagnosis not present

## 2015-11-29 DIAGNOSIS — L821 Other seborrheic keratosis: Secondary | ICD-10-CM | POA: Diagnosis not present

## 2015-11-29 DIAGNOSIS — L820 Inflamed seborrheic keratosis: Secondary | ICD-10-CM | POA: Diagnosis not present

## 2015-12-06 ENCOUNTER — Other Ambulatory Visit: Payer: Self-pay | Admitting: Internal Medicine

## 2015-12-20 DIAGNOSIS — G4733 Obstructive sleep apnea (adult) (pediatric): Secondary | ICD-10-CM | POA: Diagnosis not present

## 2015-12-20 DIAGNOSIS — G40209 Localization-related (focal) (partial) symptomatic epilepsy and epileptic syndromes with complex partial seizures, not intractable, without status epilepticus: Secondary | ICD-10-CM | POA: Diagnosis not present

## 2016-01-14 ENCOUNTER — Encounter: Payer: Self-pay | Admitting: Internal Medicine

## 2016-01-14 DIAGNOSIS — Z9109 Other allergy status, other than to drugs and biological substances: Secondary | ICD-10-CM

## 2016-01-14 DIAGNOSIS — H6120 Impacted cerumen, unspecified ear: Secondary | ICD-10-CM

## 2016-01-18 DIAGNOSIS — H6123 Impacted cerumen, bilateral: Secondary | ICD-10-CM | POA: Diagnosis not present

## 2016-01-18 DIAGNOSIS — J301 Allergic rhinitis due to pollen: Secondary | ICD-10-CM | POA: Diagnosis not present

## 2016-03-12 ENCOUNTER — Ambulatory Visit (INDEPENDENT_AMBULATORY_CARE_PROVIDER_SITE_OTHER): Payer: Commercial Managed Care - HMO | Admitting: Internal Medicine

## 2016-03-12 ENCOUNTER — Other Ambulatory Visit: Payer: Self-pay | Admitting: Internal Medicine

## 2016-03-12 ENCOUNTER — Encounter: Payer: Self-pay | Admitting: Internal Medicine

## 2016-03-12 VITALS — BP 120/70 | HR 59 | Temp 97.9°F | Resp 18 | Ht 67.5 in | Wt 174.8 lb

## 2016-03-12 DIAGNOSIS — Z91048 Other nonmedicinal substance allergy status: Secondary | ICD-10-CM

## 2016-03-12 DIAGNOSIS — I2510 Atherosclerotic heart disease of native coronary artery without angina pectoris: Secondary | ICD-10-CM

## 2016-03-12 DIAGNOSIS — I10 Essential (primary) hypertension: Secondary | ICD-10-CM

## 2016-03-12 DIAGNOSIS — L989 Disorder of the skin and subcutaneous tissue, unspecified: Secondary | ICD-10-CM

## 2016-03-12 DIAGNOSIS — E7800 Pure hypercholesterolemia, unspecified: Secondary | ICD-10-CM | POA: Diagnosis not present

## 2016-03-12 DIAGNOSIS — M1990 Unspecified osteoarthritis, unspecified site: Secondary | ICD-10-CM | POA: Diagnosis not present

## 2016-03-12 DIAGNOSIS — Z9109 Other allergy status, other than to drugs and biological substances: Secondary | ICD-10-CM

## 2016-03-12 DIAGNOSIS — Z0000 Encounter for general adult medical examination without abnormal findings: Secondary | ICD-10-CM

## 2016-03-12 DIAGNOSIS — K219 Gastro-esophageal reflux disease without esophagitis: Secondary | ICD-10-CM

## 2016-03-12 DIAGNOSIS — Z0001 Encounter for general adult medical examination with abnormal findings: Secondary | ICD-10-CM

## 2016-03-12 DIAGNOSIS — Z86010 Personal history of colonic polyps: Secondary | ICD-10-CM

## 2016-03-12 DIAGNOSIS — Z125 Encounter for screening for malignant neoplasm of prostate: Secondary | ICD-10-CM

## 2016-03-12 DIAGNOSIS — G40909 Epilepsy, unspecified, not intractable, without status epilepticus: Secondary | ICD-10-CM | POA: Diagnosis not present

## 2016-03-12 LAB — HEPATIC FUNCTION PANEL
ALT: 20 U/L (ref 0–53)
AST: 25 U/L (ref 0–37)
Albumin: 4.2 g/dL (ref 3.5–5.2)
Alkaline Phosphatase: 53 U/L (ref 39–117)
BILIRUBIN DIRECT: 0.3 mg/dL (ref 0.0–0.3)
TOTAL PROTEIN: 7.2 g/dL (ref 6.0–8.3)
Total Bilirubin: 1.6 mg/dL — ABNORMAL HIGH (ref 0.2–1.2)

## 2016-03-12 LAB — LIPID PANEL
CHOL/HDL RATIO: 5
Cholesterol: 143 mg/dL (ref 0–200)
HDL: 30.9 mg/dL — ABNORMAL LOW (ref >39.00)
LDL CALC: 79 mg/dL (ref 0–99)
NonHDL: 112.03
TRIGLYCERIDES: 164 mg/dL — AB (ref 0.0–149.0)
VLDL: 32.8 mg/dL (ref 0.0–40.0)

## 2016-03-12 LAB — CBC WITH DIFFERENTIAL/PLATELET
BASOS PCT: 0.5 % (ref 0.0–3.0)
Basophils Absolute: 0 10*3/uL (ref 0.0–0.1)
EOS ABS: 0.2 10*3/uL (ref 0.0–0.7)
EOS PCT: 3.5 % (ref 0.0–5.0)
HEMATOCRIT: 43.1 % (ref 39.0–52.0)
HEMOGLOBIN: 14.8 g/dL (ref 13.0–17.0)
LYMPHS PCT: 24.5 % (ref 12.0–46.0)
Lymphs Abs: 1.4 10*3/uL (ref 0.7–4.0)
MCHC: 34.3 g/dL (ref 30.0–36.0)
MCV: 85.1 fl (ref 78.0–100.0)
Monocytes Absolute: 0.6 10*3/uL (ref 0.1–1.0)
Monocytes Relative: 9.8 % (ref 3.0–12.0)
Neutro Abs: 3.6 10*3/uL (ref 1.4–7.7)
Neutrophils Relative %: 61.7 % (ref 43.0–77.0)
Platelets: 248 10*3/uL (ref 150.0–400.0)
RBC: 5.06 Mil/uL (ref 4.22–5.81)
RDW: 13.7 % (ref 11.5–15.5)
WBC: 5.9 10*3/uL (ref 4.0–10.5)

## 2016-03-12 LAB — PSA, MEDICARE: PSA: 1.05 ng/mL (ref 0.10–4.00)

## 2016-03-12 LAB — BASIC METABOLIC PANEL
BUN: 13 mg/dL (ref 6–23)
CALCIUM: 9.6 mg/dL (ref 8.4–10.5)
CO2: 25 mEq/L (ref 19–32)
Chloride: 107 mEq/L (ref 96–112)
Creatinine, Ser: 1.09 mg/dL (ref 0.40–1.50)
GFR: 70.14 mL/min (ref >60.00)
GLUCOSE: 106 mg/dL — AB (ref 70–99)
POTASSIUM: 3.9 meq/L (ref 3.5–5.1)
SODIUM: 140 meq/L (ref 135–145)

## 2016-03-12 MED ORDER — MUPIROCIN 2 % EX OINT: 1.0000 "application " | TOPICAL_OINTMENT | Freq: Two times a day (BID) | CUTANEOUS | Status: DC

## 2016-03-12 NOTE — Progress Notes (Signed)
Patient ID: Edwin [name], male   DOB: Sep 28, 1940, 75 y.o.   MRN: [id number]   Subjective:    Patient ID: Edwin [name], male    DOB: [birth year], 75 y.o.   MRN: [id number]  HPI  Patient here for his physical exam.  He is followed by neurology for his seizures.  Has been stable.  See note.  Also followed by cardiology.  Stable.  See Dr Bethanne Ginger note.  States he is eating.  Tries to stay active.  No chest pain.  Breathing stable.  Saw ENT about his allergies.  Advised astelin.  Was questioning using astelin and nasacort.  Symptoms improved.  No acid reflux.  No abdominal pain or cramping.  Bowels stable.  Lesion on his left buttock.  Noticed over the past several days.  Some stiffness in his hands.     Past Medical History  Diagnosis Date  . Arthritis   . Glaucoma   . Allergy   . Hypercholesterolemia   . Seizures ([place])   . History of kidney stones   . Hx of colonic polyp    Past Surgical History  Procedure Laterality Date  . Hernia repair  2008 & 2012  . Eye surgery  07/20/13    cataract  . Vasectomy  1984   Family History  Problem Relation Age of Onset  . Multiple sclerosis Sister   . Arthritis Mother   . Heart disease Mother   . Stroke Mother   . Hypertension Mother   . Arthritis Father   . Hyperlipidemia Father   . Heart disease Father   . Hypertension Father   . Parkinson's disease Father   . Colon cancer Maternal Grandmother   . Arthritis Paternal Grandmother   . Colon cancer Paternal Grandmother    Social History   Social History  . Marital Status: Married    Spouse Name: N/A  . Number of Children: N/A  . Years of Education: N/A   Social History Main Topics  . Smoking status: Never Smoker   . Smokeless tobacco: Never Used  . Alcohol Use: No  . Drug Use: No  . Sexual Activity: Yes   Other Topics Concern  . None   Social History Narrative    Outpatient Encounter Prescriptions as of 03/12/2016  Medication Sig  . aspirin 81 MG tablet Take 81 mg  by mouth at bedtime.  Marland Kitchen azelastine (ASTELIN) 0.1 % nasal spray Place 1 spray into both nostrils daily.  . Cholecalciferol (VITAMIN D3) 2000 UNITS TABS Take by mouth every morning.  . ciclopirox (PENLAC) 8 % solution Apply topically at bedtime. Apply over nail and surrounding skin. Apply daily over previous coat. After seven (7) days, may remove with alcohol and continue cycle.  . clobetasol cream (TEMOVATE) AB-123456789 % Apply 1 application topically 2 (two) times daily. Do not use on face or genital area.  Do not use for more 7-10 days in the same place.  . clopidogrel (PLAVIX) 75 MG tablet Take 75 mg by mouth daily.  . colchicine 0.6 MG tablet Take 0.6 mg by mouth daily as needed.  . latanoprost (XALATAN) 0.005 % ophthalmic solution Place 1 drop into both eyes at bedtime.  . levETIRAcetam (KEPPRA) 500 MG tablet Take 500 mg by mouth 2 (two) times daily.  Marland Kitchen levocetirizine (XYZAL) 5 MG tablet Take 5 mg by mouth at bedtime as needed for allergies.  Marland Kitchen loratadine (CLARITIN) 10 MG tablet Take 10 mg by mouth daily.  . Magnesium 400 MG  CAPS Take by mouth at bedtime.  . metoprolol tartrate (LOPRESSOR) 25 MG tablet TAKE 1/2 TABLET AT BEDTIME  . Misc Natural Products (TART CHERRY ADVANCED PO) Take 500 mg by mouth every morning.  . Multiple Vitamin (MULTIVITAMIN) tablet Take 1 tablet by mouth daily.  . Omega-3 Fatty Acids (FISH OIL) 1200 MG CAPS Take by mouth 2 (two) times daily.  Marland Kitchen omeprazole (PRILOSEC) 20 MG capsule TAKE 1 CAPSULE TWICE DAILY BEFORE MEALS  . polyethylene glycol (MIRALAX / GLYCOLAX) packet Take 17 g by mouth at bedtime.  . prednisoLONE acetate (PRED FORTE) 1 % ophthalmic suspension Place 1 drop into the left eye 3 (three) times daily.  . Psyllium (METAMUCIL PO) Take by mouth every morning.  . pyridOXINE (VITAMIN B-6) 100 MG tablet Take 100 mg by mouth daily.  . Saw Palmetto 450 MG CAPS Take by mouth every morning.  . simvastatin (ZOCOR) 40 MG tablet Take 40 mg by mouth daily.  . Triamcinolone  Acetonide (NASACORT ALLERGY 24HR NA) Place 1 spray into the nose daily.  . mupirocin ointment (BACTROBAN) 2 % Place 1 application into the nose 2 (two) times daily.   No facility-administered encounter medications on file as of 03/12/2016.    Review of Systems  Constitutional: Negative for fever and appetite change.  HENT: Negative for sinus pressure.        Saw ENT.  Feels congestion better.  Feels astelin helping.    Eyes: Negative for pain and visual disturbance.  Respiratory: Negative for cough, chest tightness and shortness of breath.   Cardiovascular: Negative for chest pain, palpitations and leg swelling.  Gastrointestinal: Negative for nausea, vomiting, abdominal pain and diarrhea.  Genitourinary: Negative for frequency and difficulty urinating.  Musculoskeletal: Negative for back pain and joint swelling.  Skin: Negative for color change.       Lesion - left buttock.    Neurological: Negative for dizziness, light-headedness and headaches.  Hematological: Negative for adenopathy. Does not bruise/bleed easily.  Psychiatric/Behavioral: Negative for dysphoric mood and agitation.       Objective:    Physical Exam  Constitutional: He is oriented to person, place, and time. He appears well-developed and well-nourished. No distress.  HENT:  Head: Normocephalic and atraumatic.  Nose: Nose normal.  Mouth/Throat: Oropharynx is clear and moist. No oropharyngeal exudate.  Eyes: Conjunctivae are normal. Right eye exhibits no discharge. Left eye exhibits no discharge.  Neck: Neck supple. No thyromegaly present.  Cardiovascular: Normal rate and regular rhythm.   Pulmonary/Chest: Effort normal and breath sounds normal. No respiratory distress. He has no wheezes.  Abdominal: Soft. Bowel sounds are normal. There is no tenderness.  Genitourinary:  Rectal exam - enlarged prostate.  Heme negative.    Musculoskeletal: He exhibits no edema or tenderness.  Lymphadenopathy:    He has no  cervical adenopathy.  Neurological: He is alert and oriented to person, place, and time.  Skin: Skin is warm and dry.  Lesion - left buttock.  No significant tenderness.    Psychiatric: He has a normal mood and affect. His behavior is normal.    BP 120/70 mmHg  Pulse 59  Temp(Src) 97.9 F (36.6 C) (Oral)  Resp 18  Ht 5' 7.5" (1.715 m)  Wt 174 lb 12 oz (79.266 kg)  BMI 26.95 kg/m2  SpO2 97% Wt Readings from Last 3 Encounters:  03/12/16 174 lb 12 oz (79.266 kg)  10/12/15 178 lb (80.74 kg)  09/13/15 180 lb 8 oz (81.874 kg)     Lab  Results  Component Value Date   WBC 5.9 03/12/2016   HGB 14.8 03/12/2016   HCT 43.1 03/12/2016   PLT 248.0 03/12/2016   GLUCOSE 106* 03/12/2016   CHOL 143 03/12/2016   TRIG 164.0* 03/12/2016   HDL 30.90* 03/12/2016   LDLDIRECT 99.1 07/29/2013   LDLCALC 79 03/12/2016   ALT 20 03/12/2016   AST 25 03/12/2016   NA 140 03/12/2016   K 3.9 03/12/2016   CL 107 03/12/2016   CREATININE 1.09 03/12/2016   BUN 13 03/12/2016   CO2 25 03/12/2016   TSH 1.66 09/13/2015   PSA 1.05 03/12/2016   HGBA1C 5.4 10/12/2015    Dg Cervical Spine 2 Or 3 Views  10/11/2015  CLINICAL DATA:  Two-month history of intermittent dull aching neck pain and stiffness with some radiation in the both shoulders common no known injury. EXAM: CERVICAL SPINE - 2-3 VIEW COMPARISON:  None in PACs FINDINGS: The cervical vertebral bodies are preserved in height. The disc space heights are well maintained. There is no perched facet. The odontoid is intact. The spinous processes exhibit no acute abnormalities. The prevertebral soft tissue spaces are normal. Calcification overlies the region of the left carotid bulb. IMPRESSION: There is no acute or significant chronic bony abnormality of the cervical spine. The bony structures are mildly osteopenic. If the patient's radicular symptoms warrant further evaluation, MRI of the cervical spine would be a useful next imaging step. Electronically Signed    By: David  Martinique M.D.   On: 10/11/2015 07:59       Assessment & Plan:   Problem List Items Addressed This Visit    Arthritis    Hand stiffness.  Follow.  Exercise.        CAD (coronary artery disease)    Heart catheterization as outlined.  Followed by cardiology.  Stable.  Continue risk factor modification.        Environmental allergies    Appears to be better on current regimen.        Essential hypertension, benign    Blood pressure under good control.  Continue same medication regimen.  Follow pressures.  Follow metabolic panel.        GERD (gastroesophageal reflux disease)    Upper symptoms controlled on omeprazole.        Health care maintenance - Primary    Physical today 03/12/16.  Colonoscopy 2008.  Check psa.       History of colonic polyps    He did discuss with Dr Ubaldo Glassing regarding colonoscopy.  States was told ok to proceed.  Last colonoscopy 2008.  Overdue.  Refer to GI.       Relevant Orders   Ambulatory referral to Gastroenterology   Hypercholesterolemia    On simvastatin.  Low cholesterol diet and exercise.  Follow lipid panel and liver function tests.        Relevant Orders   Lipid panel (Completed)   Seizure disorder ([place])    Followed by neurology.  Stable.        Relevant Orders   CBC with Differential/Platelet (Completed)   Hepatic function panel (Completed)   Basic metabolic panel (Completed)    Other Visit Diagnoses    Prostate cancer screening        Relevant Orders    PSA, Medicare (Completed)    Skin lesion        skin lesion left buttock.  bactroban.  follow.         Einar Pheasant, MD

## 2016-03-12 NOTE — Assessment & Plan Note (Signed)
Physical today 03/12/16.  Colonoscopy 2008.  Check psa.

## 2016-03-12 NOTE — Progress Notes (Signed)
Pre-visit discussion using our clinic review tool. No additional management support is needed unless otherwise documented below in the visit note.  

## 2016-03-12 NOTE — Progress Notes (Signed)
Order placed for f/u liver panel.  

## 2016-03-13 ENCOUNTER — Encounter: Payer: Self-pay | Admitting: Internal Medicine

## 2016-03-13 ENCOUNTER — Encounter: Payer: Self-pay | Admitting: *Deleted

## 2016-03-13 NOTE — Assessment & Plan Note (Signed)
Hand stiffness.  Follow.  Exercise.

## 2016-03-13 NOTE — Assessment & Plan Note (Signed)
Upper symptoms controlled on omeprazole.  

## 2016-03-13 NOTE — Assessment & Plan Note (Signed)
Blood pressure under good control.  Continue same medication regimen.  Follow pressures.  Follow metabolic panel.   

## 2016-03-13 NOTE — Assessment & Plan Note (Signed)
Appears to be better on current regimen.

## 2016-03-13 NOTE — Assessment & Plan Note (Signed)
On simvastatin.  Low cholesterol diet and exercise.  Follow lipid panel and liver function tests.   

## 2016-03-13 NOTE — Assessment & Plan Note (Signed)
Heart catheterization as outlined.  Followed by cardiology.  Stable.  Continue risk factor modification.

## 2016-03-13 NOTE — Assessment & Plan Note (Signed)
He did discuss with Dr Ubaldo Glassing regarding colonoscopy.  States was told ok to proceed.  Last colonoscopy 2008.  Overdue.  Refer to GI.

## 2016-03-13 NOTE — Assessment & Plan Note (Signed)
Followed by neurology.  Stable  

## 2016-03-18 ENCOUNTER — Other Ambulatory Visit: Payer: Self-pay | Admitting: Internal Medicine

## 2016-03-24 ENCOUNTER — Other Ambulatory Visit (INDEPENDENT_AMBULATORY_CARE_PROVIDER_SITE_OTHER): Payer: Commercial Managed Care - HMO

## 2016-03-24 LAB — HEPATIC FUNCTION PANEL
ALBUMIN: 4 g/dL (ref 3.5–5.2)
ALK PHOS: 49 U/L (ref 39–117)
ALT: 22 U/L (ref 0–53)
AST: 23 U/L (ref 0–37)
BILIRUBIN DIRECT: 0.2 mg/dL (ref 0.0–0.3)
BILIRUBIN TOTAL: 0.9 mg/dL (ref 0.2–1.2)
Total Protein: 7 g/dL (ref 6.0–8.3)

## 2016-03-25 ENCOUNTER — Encounter: Payer: Self-pay | Admitting: Internal Medicine

## 2016-04-21 DIAGNOSIS — H40053 Ocular hypertension, bilateral: Secondary | ICD-10-CM | POA: Diagnosis not present

## 2016-04-28 DIAGNOSIS — H40053 Ocular hypertension, bilateral: Secondary | ICD-10-CM | POA: Diagnosis not present

## 2016-05-13 DIAGNOSIS — Z9861 Coronary angioplasty status: Secondary | ICD-10-CM | POA: Diagnosis not present

## 2016-05-13 DIAGNOSIS — I2510 Atherosclerotic heart disease of native coronary artery without angina pectoris: Secondary | ICD-10-CM | POA: Diagnosis not present

## 2016-05-13 DIAGNOSIS — I10 Essential (primary) hypertension: Secondary | ICD-10-CM | POA: Diagnosis not present

## 2016-05-13 DIAGNOSIS — E7800 Pure hypercholesterolemia, unspecified: Secondary | ICD-10-CM | POA: Diagnosis not present

## 2016-06-12 ENCOUNTER — Ambulatory Visit (INDEPENDENT_AMBULATORY_CARE_PROVIDER_SITE_OTHER): Payer: Commercial Managed Care - HMO

## 2016-06-12 DIAGNOSIS — Z23 Encounter for immunization: Secondary | ICD-10-CM | POA: Diagnosis not present

## 2016-06-12 NOTE — Progress Notes (Signed)
Patient received flu shot 

## 2016-08-04 ENCOUNTER — Other Ambulatory Visit: Payer: Self-pay | Admitting: Internal Medicine

## 2016-09-12 ENCOUNTER — Ambulatory Visit (INDEPENDENT_AMBULATORY_CARE_PROVIDER_SITE_OTHER): Payer: Commercial Managed Care - HMO | Admitting: Internal Medicine

## 2016-09-12 ENCOUNTER — Encounter: Payer: Self-pay | Admitting: Internal Medicine

## 2016-09-12 VITALS — BP 112/68 | HR 62 | Temp 98.1°F | Ht 67.5 in | Wt 174.8 lb

## 2016-09-12 DIAGNOSIS — R739 Hyperglycemia, unspecified: Secondary | ICD-10-CM

## 2016-09-12 DIAGNOSIS — G40909 Epilepsy, unspecified, not intractable, without status epilepticus: Secondary | ICD-10-CM

## 2016-09-12 DIAGNOSIS — Z86010 Personal history of colonic polyps: Secondary | ICD-10-CM | POA: Diagnosis not present

## 2016-09-12 DIAGNOSIS — E7800 Pure hypercholesterolemia, unspecified: Secondary | ICD-10-CM | POA: Diagnosis not present

## 2016-09-12 DIAGNOSIS — I2510 Atherosclerotic heart disease of native coronary artery without angina pectoris: Secondary | ICD-10-CM

## 2016-09-12 DIAGNOSIS — I10 Essential (primary) hypertension: Secondary | ICD-10-CM

## 2016-09-12 LAB — HEPATIC FUNCTION PANEL
ALT: 20 U/L (ref 0–53)
AST: 23 U/L (ref 0–37)
Albumin: 4 g/dL (ref 3.5–5.2)
Alkaline Phosphatase: 51 U/L (ref 39–117)
BILIRUBIN DIRECT: 0.2 mg/dL (ref 0.0–0.3)
BILIRUBIN TOTAL: 1.2 mg/dL (ref 0.2–1.2)
TOTAL PROTEIN: 6.7 g/dL (ref 6.0–8.3)

## 2016-09-12 LAB — LIPID PANEL
CHOLESTEROL: 144 mg/dL (ref 0–200)
HDL: 34.2 mg/dL — ABNORMAL LOW (ref >39.00)
LDL Cholesterol: 80 mg/dL (ref 0–99)
NonHDL: 109.56
TRIGLYCERIDES: 149 mg/dL (ref 0.0–149.0)
Total CHOL/HDL Ratio: 4
VLDL: 29.8 mg/dL (ref 0.0–40.0)

## 2016-09-12 LAB — BASIC METABOLIC PANEL
BUN: 14 mg/dL (ref 6–23)
CALCIUM: 9.4 mg/dL (ref 8.4–10.5)
CO2: 28 mEq/L (ref 19–32)
Chloride: 106 mEq/L (ref 96–112)
Creatinine, Ser: 1.03 mg/dL (ref 0.40–1.50)
GFR: 74.78 mL/min (ref >60.00)
GLUCOSE: 100 mg/dL — AB (ref 70–99)
Potassium: 4.3 mEq/L (ref 3.5–5.1)
Sodium: 140 mEq/L (ref 135–145)

## 2016-09-12 LAB — HEMOGLOBIN A1C: Hgb A1c MFr Bld: 5.4 % (ref 4.6–6.5)

## 2016-09-12 NOTE — Assessment & Plan Note (Signed)
Sees Dr Ubaldo Glassing.  Last evaluated 05/2014.  Stable.  Recommended f/u in 6 months.  Continue risk factor modification.

## 2016-09-12 NOTE — Progress Notes (Signed)
Pre visit review using our clinic review tool, if applicable. No additional management support is needed unless otherwise documented below in the visit note. 

## 2016-09-12 NOTE — Assessment & Plan Note (Signed)
He has been cleared to proceed with colonoscopy by Dr Ubaldo Glassing (per pt).  Two grandmother's with colon cancer.  He has a history of polyps.  Last colonoscopy 2008.  When GI contacted states due - 06/2017.  Pt wants to proceed with colonoscopy.  Contact GI.

## 2016-09-12 NOTE — Assessment & Plan Note (Signed)
Followed by neurology.  Stable  

## 2016-09-12 NOTE — Assessment & Plan Note (Signed)
On simvastatin.  Low cholesterol diet and exercise.  Follow lipid panel and liver function tests.   

## 2016-09-12 NOTE — Progress Notes (Signed)
Patient ID: Edwin [name], male   DOB: 1941-05-04, 76 y.o.   MRN: [id number]   Subjective:    Patient ID: Edwin [name], male    DOB: 1941/05/26, 76 y.o.   MRN: [id number]  HPI  Patient here for a scheduled follow up.  Has know CAD.  Saw Dr Ubaldo Glassing 05/13/16.  On dual antiplatelet therapy.  Stable.  Recommended f/u in 6 months.  States he is doing well.  Feels good.  Stays active.  No chest pain.  No sob.  No acid reflux.  No abdominal pain or cramping.  Bowels stable.  Had questions about his colonoscopy.    Past Medical History:  Diagnosis Date  . Allergy   . Arthritis   . Glaucoma   . History of kidney stones   . Hx of colonic polyp   . Hypercholesterolemia   . Seizures ([place])    Past Surgical History:  Procedure Laterality Date  . EYE SURGERY  07/20/13   cataract  . HERNIA REPAIR  2008 & 2012  . VASECTOMY  1984   Family History  Problem Relation Age of Onset  . Arthritis Mother   . Heart disease Mother   . Stroke Mother   . Hypertension Mother   . Arthritis Father   . Hyperlipidemia Father   . Heart disease Father   . Hypertension Father   . Parkinson's disease Father   . Multiple sclerosis Sister   . Colon cancer Maternal Grandmother   . Arthritis Paternal Grandmother   . Colon cancer Paternal Grandmother    Social History   Social History  . Marital status: Married    Spouse name: N/A  . Number of children: N/A  . Years of education: N/A   Social History Main Topics  . Smoking status: Never Smoker  . Smokeless tobacco: Never Used  . Alcohol use No  . Drug use: No  . Sexual activity: Yes   Other Topics Concern  . None   Social History Narrative  . None    Outpatient Encounter Prescriptions as of 09/12/2016  Medication Sig  . aspirin 81 MG tablet Take 81 mg by mouth at bedtime.  Marland Kitchen azelastine (ASTELIN) 0.1 % nasal spray Place 1 spray into both nostrils daily.  . Cholecalciferol (VITAMIN D3) 2000 UNITS TABS Take by mouth every morning.  .  ciclopirox (PENLAC) 8 % solution Apply topically at bedtime. Apply over nail and surrounding skin. Apply daily over previous coat. After seven (7) days, may remove with alcohol and continue cycle.  . clobetasol cream (TEMOVATE) AB-123456789 % Apply 1 application topically 2 (two) times daily. Do not use on face or genital area.  Do not use for more 7-10 days in the same place.  . clopidogrel (PLAVIX) 75 MG tablet Take 75 mg by mouth daily.  . colchicine 0.6 MG tablet Take 0.6 mg by mouth daily as needed.  . latanoprost (XALATAN) 0.005 % ophthalmic solution Place 1 drop into both eyes at bedtime.  . levETIRAcetam (KEPPRA) 500 MG tablet Take 500 mg by mouth 2 (two) times daily.  Marland Kitchen levocetirizine (XYZAL) 5 MG tablet Take 5 mg by mouth at bedtime as needed for allergies.  Marland Kitchen loratadine (CLARITIN) 10 MG tablet Take 10 mg by mouth daily.  . Magnesium 400 MG CAPS Take by mouth at bedtime.  . metoprolol tartrate (LOPRESSOR) 25 MG tablet TAKE 1/2 TABLET AT BEDTIME  . Misc Natural Products (TART CHERRY ADVANCED PO) Take 500 mg by mouth every morning.  Marland Kitchen  Multiple Vitamin (MULTIVITAMIN) tablet Take 1 tablet by mouth daily.  . mupirocin ointment (BACTROBAN) 2 % Place 1 application into the nose 2 (two) times daily.  . Omega-3 Fatty Acids (FISH OIL) 1200 MG CAPS Take by mouth 2 (two) times daily.  Marland Kitchen omeprazole (PRILOSEC) 20 MG capsule TAKE 1 CAPSULE TWICE DAILY BEFORE MEALS  . polyethylene glycol (MIRALAX / GLYCOLAX) packet Take 17 g by mouth at bedtime.  . prednisoLONE acetate (PRED FORTE) 1 % ophthalmic suspension Place 1 drop into the left eye 3 (three) times daily.  . Psyllium (METAMUCIL PO) Take by mouth every morning.  . pyridOXINE (VITAMIN B-6) 100 MG tablet Take 100 mg by mouth daily.  . Saw Palmetto 450 MG CAPS Take by mouth every morning.  . simvastatin (ZOCOR) 40 MG tablet Take 40 mg by mouth daily.  . Triamcinolone Acetonide (NASACORT ALLERGY 24HR NA) Place 1 spray into the nose daily.   No  facility-administered encounter medications on file as of 09/12/2016.     Review of Systems  Constitutional: Negative for appetite change and unexpected weight change.  HENT: Negative for congestion and sinus pressure.   Respiratory: Negative for cough, chest tightness and shortness of breath.   Cardiovascular: Negative for chest pain, palpitations and leg swelling.  Gastrointestinal: Negative for abdominal pain, diarrhea, nausea and vomiting.  Genitourinary: Negative for difficulty urinating and dysuria.  Musculoskeletal: Negative for back pain and joint swelling.  Skin: Negative for color change and rash.  Neurological: Negative for dizziness, light-headedness and headaches.  Psychiatric/Behavioral: Negative for agitation and dysphoric mood.       Objective:     Blood pressure rechecked by me:  118/68  Physical Exam  Constitutional: He appears well-developed and well-nourished. No distress.  HENT:  Nose: Nose normal.  Mouth/Throat: Oropharynx is clear and moist.  Neck: Neck supple. No thyromegaly present.  Cardiovascular: Normal rate and regular rhythm.   Pulmonary/Chest: Effort normal and breath sounds normal. No respiratory distress.  Abdominal: Soft. Bowel sounds are normal. There is no tenderness.  Musculoskeletal: He exhibits no edema or tenderness.  Lymphadenopathy:    He has no cervical adenopathy.  Skin: No rash noted. No erythema.  Psychiatric: He has a normal mood and affect. His behavior is normal.    BP 112/68   Pulse 62   Temp 98.1 F (36.7 C) (Oral)   Ht 5' 7.5" (1.715 m)   Wt 174 lb 12.8 oz (79.3 kg)   SpO2 95%   BMI 26.97 kg/m  Wt Readings from Last 3 Encounters:  09/12/16 174 lb 12.8 oz (79.3 kg)  03/12/16 174 lb 12 oz (79.3 kg)  10/12/15 178 lb (80.7 kg)     Lab Results  Component Value Date   WBC 5.9 03/12/2016   HGB 14.8 03/12/2016   HCT 43.1 03/12/2016   PLT 248.0 03/12/2016   GLUCOSE 106 (H) 03/12/2016   CHOL 143 03/12/2016   TRIG  164.0 (H) 03/12/2016   HDL 30.90 (L) 03/12/2016   LDLDIRECT 99.1 07/29/2013   LDLCALC 79 03/12/2016   ALT 22 03/24/2016   AST 23 03/24/2016   NA 140 03/12/2016   K 3.9 03/12/2016   CL 107 03/12/2016   CREATININE 1.09 03/12/2016   BUN 13 03/12/2016   CO2 25 03/12/2016   TSH 1.66 09/13/2015   PSA 1.05 03/12/2016   HGBA1C 5.4 10/12/2015    Dg Cervical Spine 2 Or 3 Views  Result Date: 10/11/2015 CLINICAL DATA:  Two-month history of intermittent dull aching  neck pain and stiffness with some radiation in the both shoulders common no known injury. EXAM: CERVICAL SPINE - 2-3 VIEW COMPARISON:  None in PACs FINDINGS: The cervical vertebral bodies are preserved in height. The disc space heights are well maintained. There is no perched facet. The odontoid is intact. The spinous processes exhibit no acute abnormalities. The prevertebral soft tissue spaces are normal. Calcification overlies the region of the left carotid bulb. IMPRESSION: There is no acute or significant chronic bony abnormality of the cervical spine. The bony structures are mildly osteopenic. If the patient's radicular symptoms warrant further evaluation, MRI of the cervical spine would be a useful next imaging step. Electronically Signed   By: David  Martinique M.D.   On: 10/11/2015 07:59       Assessment & Plan:   Problem List Items Addressed This Visit    CAD (coronary artery disease)    Sees Dr Ubaldo Glassing.  Last evaluated 05/2014.  Stable.  Recommended f/u in 6 months.  Continue risk factor modification.       Essential hypertension, benign - Primary    Blood pressure under good control.  Continue same medication regimen.  Follow pressures.  Follow metabolic panel.        Relevant Orders   Basic metabolic panel   History of colonic polyps    He has been cleared to proceed with colonoscopy by Dr Ubaldo Glassing (per pt).  Two grandmother's with colon cancer.  He has a history of polyps.  Last colonoscopy 2008.  When GI contacted states due -  06/2017.  Pt wants to proceed with colonoscopy.  Contact GI.       Hypercholesterolemia    On simvastatin.  Low cholesterol diet and exercise.  Follow lipid panel and liver function tests.        Relevant Orders   Lipid panel   Hepatic function panel   Seizure disorder ([place])    Followed by neurology.  Stable.        Other Visit Diagnoses    Hyperglycemia       Relevant Orders   Hemoglobin A1c       Einar Pheasant, MD

## 2016-09-12 NOTE — Assessment & Plan Note (Signed)
Blood pressure under good control.  Continue same medication regimen.  Follow pressures.  Follow metabolic panel.   

## 2016-09-13 ENCOUNTER — Encounter: Payer: Self-pay | Admitting: Internal Medicine

## 2016-10-13 ENCOUNTER — Ambulatory Visit (INDEPENDENT_AMBULATORY_CARE_PROVIDER_SITE_OTHER): Payer: Commercial Managed Care - HMO

## 2016-10-13 ENCOUNTER — Ambulatory Visit: Payer: Commercial Managed Care - HMO

## 2016-10-13 VITALS — BP 110/68 | HR 58 | Temp 97.7°F | Resp 12 | Ht 68.0 in | Wt 176.0 lb

## 2016-10-13 DIAGNOSIS — Z0000 Encounter for general adult medical examination without abnormal findings: Secondary | ICD-10-CM | POA: Diagnosis not present

## 2016-10-13 DIAGNOSIS — Z1211 Encounter for screening for malignant neoplasm of colon: Secondary | ICD-10-CM | POA: Diagnosis not present

## 2016-10-13 NOTE — Patient Instructions (Addendum)
Edwin [name] , Thank you for taking time to come for your Medicare Wellness Visit. I appreciate your ongoing commitment to your health goals. Please review the following plan we discussed and let me know if I can assist you in the future.   Follow up with Dr.Scott as needed.  These are the goals we discussed: Goals    . Exercise 5x per week (60min per time)          Patient centered goal is to walk up to 90 min daily, 5 days per week to reach target weight of 165 lbs, weather permitting.        This is a list of the screening recommended for you and due dates:  Health Maintenance  Topic Date Due  . Colon Cancer Screening  06/12/1991  . Tetanus Vaccine  07/05/2024  . Flu Shot  Completed  . Shingles Vaccine  Completed  . Pneumonia vaccines  Completed      Colonoscopy, Adult A colonoscopy is an exam to look at the entire large intestine. During the exam, a lubricated, bendable tube is inserted into the anus and then passed into the rectum, colon, and other parts of the large intestine. A colonoscopy is often done as a part of normal colorectal screening or in response to certain symptoms, such as anemia, persistent diarrhea, abdominal pain, and blood in the stool. The exam can help screen for and diagnose medical problems, including:  Tumors.  Polyps.  Inflammation.  Areas of bleeding. Tell a health care provider about:  Any allergies you have.  All medicines you are taking, including vitamins, herbs, eye drops, creams, and over-the-counter medicines.  Any problems you or family members have had with anesthetic medicines.  Any blood disorders you have.  Any surgeries you have had.  Any medical conditions you have.  Any problems you have had passing stool. What are the risks? Generally, this is a safe procedure. However, problems may occur, including:  Bleeding.  A tear in the intestine.  A reaction to medicines given during the exam.  Infection (rare). What  happens before the procedure? Eating and drinking restrictions  Follow instructions from your health care provider about eating and drinking, which may include:  A few days before the procedure - follow a low-fiber diet. Avoid nuts, seeds, dried fruit, raw fruits, and vegetables.  1-3 days before the procedure - follow a clear liquid diet. Drink only clear liquids, such as clear broth or bouillon, black coffee or tea, clear juice, clear soft drinks or sports drinks, gelatin desert, and popsicles. Avoid any liquids that contain red or purple dye.  On the day of the procedure - do not eat or drink anything during the 2 hours before the procedure, or within the time period that your health care provider recommends. Bowel prep  If you were prescribed an oral bowel prep to clean out your colon:  Take it as told by your health care provider. Starting the day before your procedure, you will need to drink a large amount of medicated liquid. The liquid will cause you to have multiple loose stools until your stool is almost clear or light green.  If your skin or anus gets irritated from diarrhea, you may use these to relieve the irritation:  Medicated wipes, such as adult wet wipes with aloe and vitamin E.  A skin soothing-product like petroleum jelly.  If you vomit while drinking the bowel prep, take a break for up to 60 minutes and then  begin the bowel prep again. If vomiting continues and you cannot take the bowel prep without vomiting, call your health care provider. General instructions  Ask your health care provider about changing or stopping your regular medicines. This is especially important if you are taking diabetes medicines or blood thinners.  Plan to have someone take you home from the hospital or clinic. What happens during the procedure?  An IV tube may be inserted into one of your veins.  You will be given medicine to help you relax (sedative).  To reduce your risk of  infection:  Your health care team will wash or sanitize their hands.  Your anal area will be washed with soap.  You will be asked to lie on your side with your knees bent.  Your health care provider will lubricate a long, thin, flexible tube. The tube will have a camera and a light on the end.  The tube will be inserted into your anus.  The tube will be gently eased through your rectum and colon.  Air will be delivered into your colon to keep it open. You may feel some pressure or cramping.  The camera will be used to take images during the procedure.  A small tissue sample may be removed from your body to be examined under a microscope (biopsy). If any potential problems are found, the tissue will be sent to a lab for testing.  If small polyps are found, your health care provider may remove them and have them checked for cancer cells.  The tube that was inserted into your anus will be slowly removed. The procedure may vary among health care providers and hospitals. What happens after the procedure?  Your blood pressure, heart rate, breathing rate, and blood oxygen level will be monitored until the medicines you were given have worn off.  Do not drive for 24 hours after the exam.  You may have a small amount of blood in your stool.  You may pass gas and have mild abdominal cramping or bloating due to the air that was used to inflate your colon during the exam.  It is up to you to get the results of your procedure. Ask your health care provider, or the department performing the procedure, when your results will be ready. This information is not intended to replace advice given to you by your health care provider. Make sure you discuss any questions you have with your health care provider. Document Released: 08/15/2000 Document Revised: 03/07/2016 Document Reviewed: 10/30/2015 Elsevier Interactive Patient Education  2017 [employer].

## 2016-10-13 NOTE — Progress Notes (Signed)
Subjective:   Edwin [name] is a 76 y.o. male who presents for Medicare Annual/Subsequent preventive examination.  Review of Systems:  No ROS.  Medicare Wellness Visit.  Cardiac Risk Factors include: advanced age (>74men, >35 women);male gender;hypertension     Objective:    Vitals: BP 110/68 (BP Location: Right Arm, Patient Position: Sitting, Cuff Size: Normal)   Pulse (!) 58   Temp 97.7 F (36.5 C) (Oral)   Resp 12   Ht 5\' 8"  (1.727 m)   Wt 176 lb (79.8 kg)   SpO2 97%   BMI 26.76 kg/m   Body mass index is 26.76 kg/m.  Tobacco History  Smoking Status  . Never Smoker  Smokeless Tobacco  . Never Used     Counseling given: Not Answered   Past Medical History:  Diagnosis Date  . Allergy   . Arthritis   . Glaucoma   . History of kidney stones   . Hx of colonic polyp   . Hypercholesterolemia   . Seizures ([place])    Past Surgical History:  Procedure Laterality Date  . EYE SURGERY  07/20/13   cataract  . HERNIA REPAIR  2008 & 2012  . VASECTOMY  1984   Family History  Problem Relation Age of Onset  . Arthritis Mother   . Heart disease Mother   . Stroke Mother   . Hypertension Mother   . Arthritis Father   . Hyperlipidemia Father   . Heart disease Father   . Hypertension Father   . Parkinson's disease Father   . Multiple sclerosis Sister   . Colon cancer Maternal Grandmother   . Arthritis Paternal Grandmother   . Colon cancer Paternal Grandmother    History  Sexual Activity  . Sexual activity: No    Outpatient Encounter Prescriptions as of 10/13/2016  Medication Sig  . aspirin 81 MG tablet Take 81 mg by mouth at bedtime.  Marland Kitchen azelastine (ASTELIN) 0.1 % nasal spray Place 1 spray into both nostrils daily.  . Cholecalciferol (VITAMIN D3) 2000 UNITS TABS Take by mouth every morning.  . ciclopirox (PENLAC) 8 % solution Apply topically at bedtime. Apply over nail and surrounding skin. Apply daily over previous coat. After seven (7) days, may remove with  alcohol and continue cycle.  . clobetasol cream (TEMOVATE) AB-123456789 % Apply 1 application topically 2 (two) times daily. Do not use on face or genital area.  Do not use for more 7-10 days in the same place.  . clopidogrel (PLAVIX) 75 MG tablet Take 75 mg by mouth daily.  . colchicine 0.6 MG tablet Take 0.6 mg by mouth daily as needed.  . latanoprost (XALATAN) 0.005 % ophthalmic solution Place 1 drop into both eyes at bedtime.  . levETIRAcetam (KEPPRA) 500 MG tablet Take 500 mg by mouth 2 (two) times daily.  Marland Kitchen levocetirizine (XYZAL) 5 MG tablet Take 5 mg by mouth at bedtime as needed for allergies.  Marland Kitchen loratadine (CLARITIN) 10 MG tablet Take 10 mg by mouth daily.  . Magnesium 400 MG CAPS Take by mouth at bedtime.  . metoprolol tartrate (LOPRESSOR) 25 MG tablet TAKE 1/2 TABLET AT BEDTIME  . Misc Natural Products (TART CHERRY ADVANCED PO) Take 500 mg by mouth every morning.  . Multiple Vitamin (MULTIVITAMIN) tablet Take 1 tablet by mouth daily.  . mupirocin ointment (BACTROBAN) 2 % Place 1 application into the nose 2 (two) times daily.  . Omega-3 Fatty Acids (FISH OIL) 1200 MG CAPS Take by mouth 2 (two)  times daily.  Marland Kitchen omeprazole (PRILOSEC) 20 MG capsule TAKE 1 CAPSULE TWICE DAILY BEFORE MEALS  . polyethylene glycol (MIRALAX / GLYCOLAX) packet Take 17 g by mouth at bedtime.  . Psyllium (METAMUCIL PO) Take by mouth every morning.  . pyridOXINE (VITAMIN B-6) 100 MG tablet Take 100 mg by mouth daily.  . Saw Palmetto 450 MG CAPS Take by mouth every morning.  . simvastatin (ZOCOR) 40 MG tablet Take 40 mg by mouth daily.  . Triamcinolone Acetonide (NASACORT ALLERGY 24HR NA) Place 1 spray into the nose daily.  . [DISCONTINUED] prednisoLONE acetate (PRED FORTE) 1 % ophthalmic suspension Place 1 drop into the left eye 3 (three) times daily.   No facility-administered encounter medications on file as of 10/13/2016.     Activities of Daily Living In your present state of health, do you have any difficulty  performing the following activities: 10/13/2016  Hearing? Y  Vision? N  Difficulty concentrating or making decisions? N  Walking or climbing stairs? N  Dressing or bathing? N  Doing errands, shopping? N  Preparing Food and eating ? N  Using the Toilet? N  In the past six months, have you accidently leaked urine? N  Do you have problems with loss of bowel control? N  Managing your Medications? N  Managing your Finances? N  Housekeeping or managing your Housekeeping? N  Some recent data might be hidden    Patient Care Team: Einar Pheasant, MD as PCP - General (Internal Medicine)   Assessment:    This is a routine wellness examination for Edwin [name]. The goal of the wellness visit is to assist the patient how to close the gaps in care and create a preventative care plan for the patient.   Taking calcium VIT D as appropriate/Osteoporosis risk reviewed.  Medications reviewed; taking without issues or barriers.  Safety issues reviewed; smoke detectors in the home. No firearms in the home. Wears seatbelts when driving or riding with others. No violence in the home.  No identified risk were noted; The patient was oriented x 3; appropriate in dress and manner and no objective failures at ADL's or IADL's.   BMI; discussed the importance of a healthy diet, water intake and exercise. Educational material provided.  HTN; followed by PCP.  Colonoscopy ordered; follow as directed.  Educational material provided.  Patient Concerns: None at this time. Follow up with PCP as needed.  Exercise Activities and Dietary recommendations Current Exercise Habits: Home exercise routine, Type of exercise: walking, Time (Minutes): 60, Frequency (Times/Week): 4, Weekly Exercise (Minutes/Week): 240, Intensity: Mild  Goals    . Exercise 5x per week (55min per time)          Patient centered goal is to walk up to 90 min daily, 5 days per week to reach target weight of 165 lbs, weather permitting.         Fall Risk Fall Risk  10/13/2016 03/12/2016 10/12/2015 03/12/2015 12/15/2013  Falls in the past year? No No No No No   Depression Screen PHQ 2/9 Scores 10/13/2016 03/12/2016 10/12/2015 03/12/2015  PHQ - 2 Score 0 0 0 0    Cognitive Function MMSE - Mini Mental State Exam 10/12/2015  Orientation to time 5  Orientation to Place 5  Registration 3  Attention/ Calculation 5  Recall 3  Language- name 2 objects 2  Language- repeat 1  Language- follow 3 step command 3  Language- read & follow direction 1  Write a sentence 1  Copy design 1  Total score 30     6CIT Screen 10/13/2016  What Year? 0 points  What month? 0 points  What time? 0 points  Count back from 20 0 points  Months in reverse 0 points  Repeat phrase 0 points  Total Score 0    Immunization History  Administered Date(s) Administered  . Influenza, High Dose Seasonal PF 06/12/2016  . Influenza,inj,Quad PF,36+ Mos 07/29/2013, 06/20/2014, 07/18/2015  . Pneumococcal Conjugate-13 08/09/2013  . Pneumococcal Polysaccharide-23 07/18/2015  . Tdap 09/01/2004, 07/05/2014  . Zoster 08/29/2013   Screening Tests Health Maintenance  Topic Date Due  . COLONOSCOPY  06/12/1991  . TETANUS/TDAP  07/05/2024  . INFLUENZA VACCINE  Completed  . ZOSTAVAX  Completed  . PNA vac Low Risk Adult  Completed      Plan:    End of life planning; Advance aging; Advanced directives discussed. Copy of current HCPOA/Living Will requested.  Medicare Attestation I have personally reviewed: The patient's medical and social history Their use of alcohol, tobacco or illicit drugs Their current medications and supplements The patient's functional ability including ADLs,fall risks, home safety risks, cognitive, and hearing and visual impairment Diet and physical activities Evidence for depression   The patient's weight, height, BMI, and visual acuity have been recorded in the chart.  I have made referrals and provided education to the patient  based on review of the above and I have provided the patient with a written personalized care plan for preventive services.    During the course of the visit the patient was educated and counseled about the following appropriate screening and preventive services:   Vaccines to include Pneumoccal, Influenza, Hepatitis B, Td, Zostavax, HCV  Electrocardiogram  Cardiovascular Disease  Colorectal cancer screening  Diabetes screening  Prostate Cancer Screening  Glaucoma screening  Nutrition counseling   Smoking cessation counseling  Patient Instructions (the written plan) was given to the patient.    Varney Biles, LPN  [phone]

## 2016-10-13 NOTE — Progress Notes (Signed)
Care was provided under my supervision. I agree with the management as indicated in the note.  Adelfo Diebel DO  

## 2016-10-27 DIAGNOSIS — H40003 Preglaucoma, unspecified, bilateral: Secondary | ICD-10-CM | POA: Diagnosis not present

## 2016-10-31 DIAGNOSIS — Z9861 Coronary angioplasty status: Secondary | ICD-10-CM | POA: Diagnosis not present

## 2016-10-31 DIAGNOSIS — E7800 Pure hypercholesterolemia, unspecified: Secondary | ICD-10-CM | POA: Diagnosis not present

## 2016-10-31 DIAGNOSIS — I2510 Atherosclerotic heart disease of native coronary artery without angina pectoris: Secondary | ICD-10-CM | POA: Diagnosis not present

## 2016-10-31 DIAGNOSIS — I10 Essential (primary) hypertension: Secondary | ICD-10-CM | POA: Diagnosis not present

## 2016-10-31 DIAGNOSIS — R0789 Other chest pain: Secondary | ICD-10-CM | POA: Diagnosis not present

## 2016-12-08 ENCOUNTER — Other Ambulatory Visit: Payer: Self-pay | Admitting: Internal Medicine

## 2016-12-15 DIAGNOSIS — L570 Actinic keratosis: Secondary | ICD-10-CM | POA: Diagnosis not present

## 2016-12-15 DIAGNOSIS — L578 Other skin changes due to chronic exposure to nonionizing radiation: Secondary | ICD-10-CM | POA: Diagnosis not present

## 2016-12-15 DIAGNOSIS — Z1283 Encounter for screening for malignant neoplasm of skin: Secondary | ICD-10-CM | POA: Diagnosis not present

## 2016-12-15 DIAGNOSIS — L821 Other seborrheic keratosis: Secondary | ICD-10-CM | POA: Diagnosis not present

## 2016-12-15 DIAGNOSIS — L820 Inflamed seborrheic keratosis: Secondary | ICD-10-CM | POA: Diagnosis not present

## 2016-12-15 DIAGNOSIS — L812 Freckles: Secondary | ICD-10-CM | POA: Diagnosis not present

## 2016-12-15 DIAGNOSIS — I8393 Asymptomatic varicose veins of bilateral lower extremities: Secondary | ICD-10-CM | POA: Diagnosis not present

## 2016-12-15 DIAGNOSIS — D229 Melanocytic nevi, unspecified: Secondary | ICD-10-CM | POA: Diagnosis not present

## 2016-12-15 DIAGNOSIS — D1800 Hemangioma unspecified site: Secondary | ICD-10-CM | POA: Diagnosis not present

## 2016-12-19 DIAGNOSIS — G4733 Obstructive sleep apnea (adult) (pediatric): Secondary | ICD-10-CM | POA: Diagnosis not present

## 2016-12-19 DIAGNOSIS — Z87898 Personal history of other specified conditions: Secondary | ICD-10-CM | POA: Diagnosis not present

## 2016-12-22 ENCOUNTER — Other Ambulatory Visit: Payer: Self-pay | Admitting: Internal Medicine

## 2017-01-02 DIAGNOSIS — Z8371 Family history of colonic polyps: Secondary | ICD-10-CM | POA: Diagnosis not present

## 2017-01-02 DIAGNOSIS — G4733 Obstructive sleep apnea (adult) (pediatric): Secondary | ICD-10-CM | POA: Diagnosis not present

## 2017-03-19 ENCOUNTER — Ambulatory Visit (INDEPENDENT_AMBULATORY_CARE_PROVIDER_SITE_OTHER): Payer: Medicare HMO | Admitting: Internal Medicine

## 2017-03-19 ENCOUNTER — Encounter: Payer: Self-pay | Admitting: Internal Medicine

## 2017-03-19 VITALS — BP 118/68 | HR 61 | Temp 98.6°F | Resp 12 | Ht 68.0 in | Wt 174.8 lb

## 2017-03-19 DIAGNOSIS — K219 Gastro-esophageal reflux disease without esophagitis: Secondary | ICD-10-CM

## 2017-03-19 DIAGNOSIS — R739 Hyperglycemia, unspecified: Secondary | ICD-10-CM

## 2017-03-19 DIAGNOSIS — Z9109 Other allergy status, other than to drugs and biological substances: Secondary | ICD-10-CM

## 2017-03-19 DIAGNOSIS — E7800 Pure hypercholesterolemia, unspecified: Secondary | ICD-10-CM

## 2017-03-19 DIAGNOSIS — G40909 Epilepsy, unspecified, not intractable, without status epilepticus: Secondary | ICD-10-CM | POA: Diagnosis not present

## 2017-03-19 DIAGNOSIS — I2510 Atherosclerotic heart disease of native coronary artery without angina pectoris: Secondary | ICD-10-CM

## 2017-03-19 DIAGNOSIS — Z0000 Encounter for general adult medical examination without abnormal findings: Secondary | ICD-10-CM | POA: Diagnosis not present

## 2017-03-19 DIAGNOSIS — I10 Essential (primary) hypertension: Secondary | ICD-10-CM | POA: Diagnosis not present

## 2017-03-19 DIAGNOSIS — Z125 Encounter for screening for malignant neoplasm of prostate: Secondary | ICD-10-CM

## 2017-03-19 LAB — CBC WITH DIFFERENTIAL/PLATELET
Basophils Absolute: 0 10*3/uL (ref 0.0–0.1)
Basophils Relative: 0.8 % (ref 0.0–3.0)
EOS ABS: 0.3 10*3/uL (ref 0.0–0.7)
Eosinophils Relative: 4.7 % (ref 0.0–5.0)
HCT: 43.6 % (ref 39.0–52.0)
HEMOGLOBIN: 14.9 g/dL (ref 13.0–17.0)
Lymphocytes Relative: 26.1 % (ref 12.0–46.0)
Lymphs Abs: 1.6 10*3/uL (ref 0.7–4.0)
MCHC: 34.1 g/dL (ref 30.0–36.0)
MCV: 86.2 fl (ref 78.0–100.0)
MONO ABS: 0.6 10*3/uL (ref 0.1–1.0)
Monocytes Relative: 9.9 % (ref 3.0–12.0)
NEUTROS PCT: 58.5 % (ref 43.0–77.0)
Neutro Abs: 3.5 10*3/uL (ref 1.4–7.7)
Platelets: 236 10*3/uL (ref 150.0–400.0)
RBC: 5.06 Mil/uL (ref 4.22–5.81)
RDW: 13.3 % (ref 11.5–15.5)
WBC: 6 10*3/uL (ref 4.0–10.5)

## 2017-03-19 LAB — TSH: TSH: 1.66 u[IU]/mL (ref 0.35–4.50)

## 2017-03-19 LAB — LIPID PANEL
CHOLESTEROL: 147 mg/dL (ref 0–200)
HDL: 34.2 mg/dL — ABNORMAL LOW (ref >39.00)
LDL Cholesterol: 82 mg/dL (ref 0–99)
NonHDL: 113.1
Total CHOL/HDL Ratio: 4
Triglycerides: 155 mg/dL — ABNORMAL HIGH (ref 0.0–149.0)
VLDL: 31 mg/dL (ref 0.0–40.0)

## 2017-03-19 LAB — BASIC METABOLIC PANEL
BUN: 15 mg/dL (ref 6–23)
CALCIUM: 9.6 mg/dL (ref 8.4–10.5)
CO2: 28 mEq/L (ref 19–32)
CREATININE: 1.17 mg/dL (ref 0.40–1.50)
Chloride: 105 mEq/L (ref 96–112)
GFR: 64.46 mL/min (ref >60.00)
GLUCOSE: 97 mg/dL (ref 70–99)
Potassium: 3.8 mEq/L (ref 3.5–5.1)
SODIUM: 139 meq/L (ref 135–145)

## 2017-03-19 LAB — HEPATIC FUNCTION PANEL
ALK PHOS: 52 U/L (ref 39–117)
ALT: 19 U/L (ref 0–53)
AST: 23 U/L (ref 0–37)
Albumin: 4.3 g/dL (ref 3.5–5.2)
BILIRUBIN TOTAL: 1.1 mg/dL (ref 0.2–1.2)
Bilirubin, Direct: 0.2 mg/dL (ref 0.0–0.3)
Total Protein: 7.1 g/dL (ref 6.0–8.3)

## 2017-03-19 LAB — PSA, MEDICARE: PSA: 1.08 ng/mL (ref 0.10–4.00)

## 2017-03-19 LAB — HEMOGLOBIN A1C: Hgb A1c MFr Bld: 5.3 % (ref 4.6–6.5)

## 2017-03-19 MED ORDER — AZELASTINE HCL 0.1 % NA SOLN: 1.0000 | Freq: Two times a day (BID) | NASAL | 1 refills | Status: DC

## 2017-03-19 MED ORDER — ZOSTER VAC RECOMB ADJUVANTED 50 MCG/0.5ML IM SUSR: 0.5000 mL | Freq: Once | INTRAMUSCULAR | 0 refills | Status: AC

## 2017-03-19 NOTE — Assessment & Plan Note (Addendum)
Physical today 03/19/17.  Colonoscopy scheduled for 04/2017.  PSA check today.

## 2017-03-19 NOTE — Progress Notes (Signed)
Patient ID: SANJIT MCMICHAEL, male   DOB: 1940/09/05, 76 y.o.   MRN: [id number]   Subjective:    Patient ID: DREAM NODAL, male    DOB: 1941/04/17, 76 y.o.   MRN: [id number]  HPI  Patient here for his physical exam.  He reports he is doing well.  Still some allergy issues, but overall is relatively stable.  Has been evaluated by ENT.  Tries to stay active.  No chest pain.  No sob.  No acid reflux.  No abdominal pain.  Bowels moving.  Discussed vaccines.     Past Medical History:  Diagnosis Date  . Allergy   . Arthritis   . Glaucoma   . History of kidney stones   . Hx of colonic polyp   . Hypercholesterolemia   . Seizures ([place])    Past Surgical History:  Procedure Laterality Date  . EYE SURGERY  07/20/13   cataract  . HERNIA REPAIR  2008 & 2012  . VASECTOMY  1984   Family History  Problem Relation Age of Onset  . Arthritis Mother   . Heart disease Mother   . Stroke Mother   . Hypertension Mother   . Arthritis Father   . Hyperlipidemia Father   . Heart disease Father   . Hypertension Father   . Parkinson's disease Father   . Multiple sclerosis Sister   . Colon cancer Maternal Grandmother   . Arthritis Paternal Grandmother   . Colon cancer Paternal Grandmother    Social History   Social History  . Marital status: Married    Spouse name: N/A  . Number of children: N/A  . Years of education: N/A   Social History Main Topics  . Smoking status: Never Smoker  . Smokeless tobacco: Never Used  . Alcohol use No  . Drug use: No  . Sexual activity: No   Other Topics Concern  . None   Social History Narrative  . None    Outpatient Encounter Prescriptions as of 03/19/2017  Medication Sig  . aspirin 81 MG tablet Take 81 mg by mouth at bedtime.  Marland Kitchen azelastine (ASTELIN) 0.1 % nasal spray Place 1 spray into both nostrils 2 (two) times daily.  Marland Kitchen b complex vitamins tablet Take 1 tablet by mouth daily.  . Cholecalciferol (VITAMIN D3) 2000 UNITS TABS Take by mouth  every morning.  . ciclopirox (PENLAC) 8 % solution Apply topically at bedtime. Apply over nail and surrounding skin. Apply daily over previous coat. After seven (7) days, may remove with alcohol and continue cycle.  . clobetasol cream (TEMOVATE) 8.41 % Apply 1 application topically 2 (two) times daily. Do not use on face or genital area.  Do not use for more 7-10 days in the same place.  . clopidogrel (PLAVIX) 75 MG tablet Take 75 mg by mouth daily.  . colchicine 0.6 MG tablet Take 0.6 mg by mouth daily as needed.  . latanoprost (XALATAN) 0.005 % ophthalmic solution Place 1 drop into both eyes at bedtime.  . levETIRAcetam (KEPPRA) 500 MG tablet Take 500 mg by mouth daily.   Marland Kitchen levocetirizine (XYZAL) 5 MG tablet Take 5 mg by mouth at bedtime as needed for allergies.  Marland Kitchen loratadine (CLARITIN) 10 MG tablet Take 10 mg by mouth daily.  . Magnesium 400 MG CAPS Take by mouth at bedtime.  . metoprolol tartrate (LOPRESSOR) 25 MG tablet TAKE 1/2 TABLET AT BEDTIME  . Misc Natural Products (TART CHERRY ADVANCED PO) Take 500 mg by mouth  every morning.  . Multiple Vitamin (MULTIVITAMIN) tablet Take 1 tablet by mouth daily.  . mupirocin ointment (BACTROBAN) 2 % Place 1 application into the nose 2 (two) times daily.  . Omega-3 Fatty Acids (FISH OIL) 1200 MG CAPS Take by mouth 2 (two) times daily.  Marland Kitchen omeprazole (PRILOSEC) 20 MG capsule TAKE 1 CAPSULE TWICE DAILY BEFORE MEALS  . polyethylene glycol (MIRALAX / GLYCOLAX) packet Take 17 g by mouth at bedtime.  . Psyllium (METAMUCIL PO) Take by mouth every morning.  . pyridOXINE (VITAMIN B-6) 100 MG tablet Take 100 mg by mouth daily.  . Saw Palmetto 450 MG CAPS Take by mouth every morning.  . simvastatin (ZOCOR) 40 MG tablet Take 40 mg by mouth daily.  . Triamcinolone Acetonide (NASACORT ALLERGY 24HR NA) Place 1 spray into the nose daily.  . [DISCONTINUED] azelastine (ASTELIN) 0.1 % nasal spray Place 1 spray into both nostrils 2 (two) times daily.   . [EXPIRED]  Zoster Vac Recomb Adjuvanted (SHINGRIX) injection Inject 0.5 mLs into the muscle once.   No facility-administered encounter medications on file as of 03/19/2017.     Review of Systems  Constitutional: Negative for appetite change and unexpected weight change.  HENT: Positive for congestion and postnasal drip. Negative for sinus pressure.   Eyes: Negative for pain and visual disturbance.  Respiratory: Negative for cough, chest tightness and shortness of breath.   Cardiovascular: Negative for chest pain, palpitations and leg swelling.  Gastrointestinal: Negative for abdominal pain, diarrhea, nausea and vomiting.  Genitourinary: Negative for difficulty urinating and dysuria.  Musculoskeletal: Negative for back pain and joint swelling.  Skin: Negative for color change and rash.  Neurological: Negative for dizziness, light-headedness and headaches.  Hematological: Negative for adenopathy. Does not bruise/bleed easily.  Psychiatric/Behavioral: Negative for agitation and dysphoric mood.       Objective:     Blood pressure rechecked by me:  130/78  Physical Exam  Constitutional: He is oriented to person, place, and time. He appears well-developed and well-nourished. No distress.  HENT:  Head: Normocephalic and atraumatic.  Nose: Nose normal.  Mouth/Throat: Oropharynx is clear and moist. No oropharyngeal exudate.  Eyes: Conjunctivae are normal. Right eye exhibits no discharge. Left eye exhibits no discharge.  Neck: Neck supple. No thyromegaly present.  Cardiovascular: Normal rate and regular rhythm.   Pulmonary/Chest: Breath sounds normal. No respiratory distress. He has no wheezes.  Abdominal: Soft. Bowel sounds are normal. There is no tenderness.  Genitourinary:  Genitourinary Comments: Rectal exam - enlarge prostate.    Musculoskeletal: He exhibits no edema or tenderness.  Lymphadenopathy:    He has no cervical adenopathy.  Neurological: He is alert and oriented to person, place,  and time.  Skin: Skin is warm and dry. No rash noted.  Psychiatric: He has a normal mood and affect. His behavior is normal.    BP 118/68 (BP Location: Left Arm, Patient Position: Sitting, Cuff Size: Normal)   Pulse 61   Temp 98.6 F (37 C) (Oral)   Resp 12   Ht 5\' 8"  (1.727 m)   Wt 174 lb 12.8 oz (79.3 kg)   SpO2 98%   BMI 26.58 kg/m  Wt Readings from Last 3 Encounters:  03/19/17 174 lb 12.8 oz (79.3 kg)  10/13/16 176 lb (79.8 kg)  09/12/16 174 lb 12.8 oz (79.3 kg)     Lab Results  Component Value Date   WBC 6.0 03/19/2017   HGB 14.9 03/19/2017   HCT 43.6 03/19/2017   PLT  236.0 03/19/2017   GLUCOSE 97 03/19/2017   CHOL 147 03/19/2017   TRIG 155.0 (H) 03/19/2017   HDL 34.20 (L) 03/19/2017   LDLDIRECT 99.1 07/29/2013   LDLCALC 82 03/19/2017   ALT 19 03/19/2017   AST 23 03/19/2017   NA 139 03/19/2017   K 3.8 03/19/2017   CL 105 03/19/2017   CREATININE 1.17 03/19/2017   BUN 15 03/19/2017   CO2 28 03/19/2017   TSH 1.66 03/19/2017   PSA 1.08 03/19/2017   HGBA1C 5.3 03/19/2017    Dg Cervical Spine 2 Or 3 Views  Result Date: 10/11/2015 CLINICAL DATA:  Two-month history of intermittent dull aching neck pain and stiffness with some radiation in the both shoulders common no known injury. EXAM: CERVICAL SPINE - 2-3 VIEW COMPARISON:  None in PACs FINDINGS: The cervical vertebral bodies are preserved in height. The disc space heights are well maintained. There is no perched facet. The odontoid is intact. The spinous processes exhibit no acute abnormalities. The prevertebral soft tissue spaces are normal. Calcification overlies the region of the left carotid bulb. IMPRESSION: There is no acute or significant chronic bony abnormality of the cervical spine. The bony structures are mildly osteopenic. If the patient's radicular symptoms warrant further evaluation, MRI of the cervical spine would be a useful next imaging step. Electronically Signed   By: David  Martinique M.D.   On:  10/11/2015 07:59      Assessment & Plan:   Problem List Items Addressed This Visit    CAD (coronary artery disease)    Followed by Dr Ubaldo Glassing.  Stable.  Continue risk factor modification.        Environmental allergies    Stable on current regimen.        Essential hypertension, benign - Primary    Blood pressure under good control.  Continue same medication regimen.  Follow pressures.  Follow metabolic panel.        Relevant Orders   CBC with Differential/Platelet (Completed)   TSH (Completed)   Basic metabolic panel (Completed)   GERD (gastroesophageal reflux disease)    Controlled on omeprazole.        Health care maintenance    Physical today 03/19/17.  Colonoscopy scheduled for 04/2017.  PSA check today.        Hypercholesterolemia    On simvastatin.  Low cholesterol diet and exercise.  Follow lipid panel and liver function tests.        Relevant Orders   Lipid panel (Completed)   Hepatic function panel (Completed)   Seizure disorder ([place])    Followed by neurology.  Tapered Keppra down.  Now on 500mg  q pm and doing well.  Follow.        Other Visit Diagnoses    Hyperglycemia       Relevant Orders   Hemoglobin A1c (Completed)   Prostate cancer screening       Relevant Orders   PSA, Medicare (Completed)       Einar Pheasant, MD

## 2017-03-19 NOTE — Progress Notes (Signed)
Pre-visit discussion using our clinic review tool. No additional management support is needed unless otherwise documented below in the visit note.  

## 2017-03-20 ENCOUNTER — Encounter: Payer: Self-pay | Admitting: Internal Medicine

## 2017-03-21 ENCOUNTER — Encounter: Payer: Self-pay | Admitting: Internal Medicine

## 2017-03-21 NOTE — Assessment & Plan Note (Signed)
Followed by Dr Fath.  Stable.  Continue risk factor modification.   

## 2017-03-21 NOTE — Assessment & Plan Note (Signed)
Followed by neurology.  Tapered Keppra down.  Now on 500mg  q pm and doing well.  Follow.

## 2017-03-21 NOTE — Assessment & Plan Note (Signed)
Blood pressure under good control.  Continue same medication regimen.  Follow pressures.  Follow metabolic panel.   

## 2017-03-21 NOTE — Assessment & Plan Note (Signed)
Controlled on omeprazole.   

## 2017-03-21 NOTE — Assessment & Plan Note (Signed)
On simvastatin.  Low cholesterol diet and exercise.  Follow lipid panel and liver function tests.   

## 2017-03-21 NOTE — Assessment & Plan Note (Signed)
Stable on current regimen   

## 2017-04-06 ENCOUNTER — Ambulatory Visit
Admission: RE | Admit: 2017-04-06 | Payer: Medicare HMO | Source: Ambulatory Visit | Admitting: Unknown Physician Specialty

## 2017-04-06 ENCOUNTER — Encounter: Admission: RE | Payer: Self-pay | Source: Ambulatory Visit

## 2017-04-06 SURGERY — COLONOSCOPY WITH PROPOFOL
Anesthesia: General

## 2017-05-01 DIAGNOSIS — H40053 Ocular hypertension, bilateral: Secondary | ICD-10-CM | POA: Diagnosis not present

## 2017-05-05 DIAGNOSIS — H40003 Preglaucoma, unspecified, bilateral: Secondary | ICD-10-CM | POA: Diagnosis not present

## 2017-05-11 ENCOUNTER — Other Ambulatory Visit: Payer: Self-pay | Admitting: Internal Medicine

## 2017-05-14 ENCOUNTER — Encounter: Payer: Self-pay | Admitting: *Deleted

## 2017-05-15 DIAGNOSIS — I2510 Atherosclerotic heart disease of native coronary artery without angina pectoris: Secondary | ICD-10-CM | POA: Diagnosis not present

## 2017-05-15 DIAGNOSIS — I10 Essential (primary) hypertension: Secondary | ICD-10-CM | POA: Diagnosis not present

## 2017-05-15 DIAGNOSIS — Z9861 Coronary angioplasty status: Secondary | ICD-10-CM | POA: Diagnosis not present

## 2017-05-15 DIAGNOSIS — E7800 Pure hypercholesterolemia, unspecified: Secondary | ICD-10-CM | POA: Diagnosis not present

## 2017-05-18 ENCOUNTER — Ambulatory Visit: Payer: Medicare HMO | Admitting: Anesthesiology

## 2017-05-18 ENCOUNTER — Ambulatory Visit
Admission: RE | Admit: 2017-05-18 | Discharge: 2017-05-18 | Disposition: A | Discharge status: Home / Self Care | Payer: Medicare HMO | Source: Ambulatory Visit | Attending: Unknown Physician Specialty | Admitting: Unknown Physician Specialty

## 2017-05-18 ENCOUNTER — Encounter
Admission: RE | Disposition: A | Discharge status: Home / Self Care | Payer: Self-pay | Source: Ambulatory Visit | Attending: Unknown Physician Specialty

## 2017-05-18 ENCOUNTER — Encounter: Payer: Self-pay | Admitting: Anesthesiology

## 2017-05-18 DIAGNOSIS — Z7982 Long term (current) use of aspirin: Secondary | ICD-10-CM | POA: Insufficient documentation

## 2017-05-18 DIAGNOSIS — Z800 Family history of malignant neoplasm of digestive organs: Secondary | ICD-10-CM | POA: Insufficient documentation

## 2017-05-18 DIAGNOSIS — Z1211 Encounter for screening for malignant neoplasm of colon: Secondary | ICD-10-CM | POA: Diagnosis not present

## 2017-05-18 DIAGNOSIS — D127 Benign neoplasm of rectosigmoid junction: Secondary | ICD-10-CM | POA: Insufficient documentation

## 2017-05-18 DIAGNOSIS — K219 Gastro-esophageal reflux disease without esophagitis: Secondary | ICD-10-CM | POA: Insufficient documentation

## 2017-05-18 DIAGNOSIS — K5730 Diverticulosis of large intestine without perforation or abscess without bleeding: Secondary | ICD-10-CM | POA: Insufficient documentation

## 2017-05-18 DIAGNOSIS — I2510 Atherosclerotic heart disease of native coronary artery without angina pectoris: Secondary | ICD-10-CM | POA: Diagnosis not present

## 2017-05-18 DIAGNOSIS — H409 Unspecified glaucoma: Secondary | ICD-10-CM | POA: Diagnosis not present

## 2017-05-18 DIAGNOSIS — Z882 Allergy status to sulfonamides status: Secondary | ICD-10-CM | POA: Diagnosis not present

## 2017-05-18 DIAGNOSIS — Z87442 Personal history of urinary calculi: Secondary | ICD-10-CM | POA: Insufficient documentation

## 2017-05-18 DIAGNOSIS — E7800 Pure hypercholesterolemia, unspecified: Secondary | ICD-10-CM | POA: Diagnosis not present

## 2017-05-18 DIAGNOSIS — K3189 Other diseases of stomach and duodenum: Secondary | ICD-10-CM | POA: Diagnosis not present

## 2017-05-18 DIAGNOSIS — K6389 Other specified diseases of intestine: Secondary | ICD-10-CM | POA: Diagnosis not present

## 2017-05-18 DIAGNOSIS — M1990 Unspecified osteoarthritis, unspecified site: Secondary | ICD-10-CM | POA: Insufficient documentation

## 2017-05-18 DIAGNOSIS — K529 Noninfective gastroenteritis and colitis, unspecified: Secondary | ICD-10-CM | POA: Diagnosis not present

## 2017-05-18 DIAGNOSIS — Z8249 Family history of ischemic heart disease and other diseases of the circulatory system: Secondary | ICD-10-CM | POA: Diagnosis not present

## 2017-05-18 DIAGNOSIS — Z8371 Family history of colonic polyps: Secondary | ICD-10-CM | POA: Diagnosis not present

## 2017-05-18 DIAGNOSIS — Z880 Allergy status to penicillin: Secondary | ICD-10-CM | POA: Diagnosis not present

## 2017-05-18 DIAGNOSIS — Z86010 Personal history of colonic polyps: Secondary | ICD-10-CM | POA: Diagnosis not present

## 2017-05-18 DIAGNOSIS — K5289 Other specified noninfective gastroenteritis and colitis: Secondary | ICD-10-CM | POA: Diagnosis not present

## 2017-05-18 DIAGNOSIS — Z955 Presence of coronary angioplasty implant and graft: Secondary | ICD-10-CM | POA: Diagnosis not present

## 2017-05-18 DIAGNOSIS — K649 Unspecified hemorrhoids: Secondary | ICD-10-CM | POA: Diagnosis not present

## 2017-05-18 DIAGNOSIS — M109 Gout, unspecified: Secondary | ICD-10-CM | POA: Insufficient documentation

## 2017-05-18 DIAGNOSIS — I10 Essential (primary) hypertension: Secondary | ICD-10-CM | POA: Diagnosis not present

## 2017-05-18 HISTORY — PX: COLONOSCOPY WITH PROPOFOL: SHX5780

## 2017-05-18 HISTORY — DX: Unspecified hemorrhoids: K64.9

## 2017-05-18 HISTORY — DX: Gastro-esophageal reflux disease without esophagitis: K21.9

## 2017-05-18 HISTORY — DX: Diverticulosis of large intestine without perforation or abscess without bleeding: K57.30

## 2017-05-18 HISTORY — DX: Atherosclerotic heart disease of native coronary artery without angina pectoris: I25.10

## 2017-05-18 HISTORY — DX: Esophageal obstruction: K22.2

## 2017-05-18 HISTORY — DX: Gout, unspecified: M10.9

## 2017-05-18 LAB — HM COLONOSCOPY

## 2017-05-18 SURGERY — COLONOSCOPY WITH PROPOFOL
Anesthesia: General

## 2017-05-18 MED ORDER — SODIUM CHLORIDE 0.9 % IV SOLN: INTRAVENOUS | Status: DC

## 2017-05-18 MED ORDER — MIDAZOLAM HCL 2 MG/2ML IJ SOLN
INTRAMUSCULAR | Status: AC
  Filled 2017-05-18: qty 2

## 2017-05-18 MED ORDER — PROPOFOL 500 MG/50ML IV EMUL
INTRAVENOUS | Status: AC
  Filled 2017-05-18: qty 50

## 2017-05-18 MED ORDER — SODIUM CHLORIDE 0.9 % IV SOLN
INTRAVENOUS | Status: DC
  Administered 2017-05-18: 11:00:00 via INTRAVENOUS

## 2017-05-18 MED ORDER — FENTANYL CITRATE (PF) 100 MCG/2ML IJ SOLN
INTRAMUSCULAR | Status: DC | PRN
Start: 2017-05-18 — End: 2017-05-18
  Administered 2017-05-18: 50 ug via INTRAVENOUS

## 2017-05-18 MED ORDER — EPHEDRINE SULFATE 50 MG/ML IJ SOLN
INTRAMUSCULAR | Status: AC
  Filled 2017-05-18: qty 1

## 2017-05-18 MED ORDER — EPHEDRINE SULFATE 50 MG/ML IJ SOLN
INTRAMUSCULAR | Status: DC | PRN
  Administered 2017-05-18: 10 mg via INTRAVENOUS
  Administered 2017-05-18: 5 mg via INTRAVENOUS

## 2017-05-18 MED ORDER — MIDAZOLAM HCL 2 MG/2ML IJ SOLN
INTRAMUSCULAR | Status: DC | PRN
  Administered 2017-05-18: 2 mg via INTRAVENOUS

## 2017-05-18 MED ORDER — PROPOFOL 500 MG/50ML IV EMUL
INTRAVENOUS | Status: DC | PRN
  Administered 2017-05-18: 120 ug/kg/min via INTRAVENOUS

## 2017-05-18 MED ORDER — FENTANYL CITRATE (PF) 100 MCG/2ML IJ SOLN
INTRAMUSCULAR | Status: AC
  Filled 2017-05-18: qty 2

## 2017-05-18 NOTE — H&P (Signed)
Primary Care Physician:  Einar Pheasant, MD Primary Gastroenterologist:  Dr. Vira Agar  Pre-Procedure History & Physical: HPI:  Edwin [name] is a 76 y.o. male is here for an colonoscopy.   Past Medical History:  Diagnosis Date  . Allergy   . Arthritis   . Coronary artery disease   . Diverticulosis of colon   . GERD (gastroesophageal reflux disease)    ESOPHAGEAL REFLUX  . Glaucoma   . Gout   . Hemorrhoids   . History of kidney stones   . Hx of colonic polyp   . Hypercholesterolemia   . Hypertension   . Schatzki's ring   . Seizures ([place])     Past Surgical History:  Procedure Laterality Date  . CARDIAC CATHETERIZATION    . CORONARY ANGIOPLASTY    . EYE SURGERY  07/20/13   cataract  . HERNIA REPAIR Right 2008 & 2012  . VASECTOMY  1984    Prior to Admission medications   Medication Sig Start Date End Date Taking? Authorizing Provider  aspirin 81 MG tablet Take 81 mg by mouth at bedtime.   Yes [provider]  azelastine (ASTELIN) 0.1 % nasal spray Place 1 spray into both nostrils 2 (two) times daily. 03/19/17  Yes Einar Pheasant, MD  b complex vitamins tablet Take 1 tablet by mouth daily.   Yes [provider]  Cholecalciferol (VITAMIN D3) 2000 UNITS TABS Take by mouth every morning.   Yes [provider]  clopidogrel (PLAVIX) 75 MG tablet Take 75 mg by mouth daily.   Yes [provider]  latanoprost (XALATAN) 0.005 % ophthalmic solution Place 1 drop into both eyes at bedtime.   Yes [provider]  levETIRAcetam (KEPPRA) 500 MG tablet Take 500 mg by mouth daily.    Yes [provider]  loratadine (CLARITIN) 10 MG tablet Take 10 mg by mouth daily.   Yes [provider]  Magnesium 400 MG CAPS Take by mouth at bedtime.   Yes [provider]  metoprolol tartrate (LOPRESSOR) 25 MG tablet TAKE 1/2 TABLET AT BEDTIME 05/11/17  Yes Einar Pheasant, MD  Misc Natural Products (TART CHERRY ADVANCED PO) Take  500 mg by mouth every morning.   Yes [provider]  Multiple Vitamin (MULTIVITAMIN) tablet Take 1 tablet by mouth daily.   Yes [provider]  Omega-3 Fatty Acids (FISH OIL) 1200 MG CAPS Take by mouth 2 (two) times daily.   Yes [provider]  omeprazole (PRILOSEC) 20 MG capsule TAKE 1 CAPSULE TWICE DAILY BEFORE MEALS 12/09/16  Yes Einar Pheasant, MD  Psyllium (METAMUCIL PO) Take by mouth every morning.   Yes [provider]  pyridOXINE (VITAMIN B-6) 100 MG tablet Take 100 mg by mouth daily.   Yes [provider]  Saw Palmetto 450 MG CAPS Take by mouth every morning.   Yes [provider]  simvastatin (ZOCOR) 40 MG tablet Take 40 mg by mouth daily.   Yes [provider]  ciclopirox (PENLAC) 8 % solution Apply topically at bedtime. Apply over nail and surrounding skin. Apply daily over previous coat. After seven (7) days, may remove with alcohol and continue cycle. Patient not taking: Reported on 05/18/2017 11/12/14   Einar Pheasant, MD  clobetasol cream (TEMOVATE) 5.02 % Apply 1 application topically 2 (two) times daily. Do not use on face or genital area.  Do not use for more 7-10 days in the same place. Patient not taking: Reported on 05/18/2017 07/29/13  Einar Pheasant, MD  colchicine 0.6 MG tablet Take 0.6 mg by mouth daily as needed.    [provider]  levocetirizine (XYZAL) 5 MG tablet Take 5 mg by mouth at bedtime as needed for allergies.    [provider]  mupirocin ointment (BACTROBAN) 2 % Place 1 application into the nose 2 (two) times daily. Patient not taking: Reported on 05/18/2017 03/12/16   Einar Pheasant, MD  polyethylene glycol [facility] / Floria Raveling) packet Take 17 g by mouth at bedtime.    [provider]  Triamcinolone Acetonide (NASACORT ALLERGY 24HR NA) Place 1 spray into the nose daily.    [provider]    Allergies as of 05/14/2017 - Review Complete 05/14/2017  Allergen  Reaction Noted  . Penicillins Rash 07/29/2013  . Sulfa antibiotics Rash 07/29/2013    Family History  Problem Relation Age of Onset  . Arthritis Mother   . Heart disease Mother   . Stroke Mother   . Hypertension Mother   . Arthritis Father   . Hyperlipidemia Father   . Heart disease Father   . Hypertension Father   . Parkinson's disease Father   . Multiple sclerosis Sister   . Colon cancer Maternal Grandmother   . Arthritis Paternal Grandmother   . Colon cancer Paternal Grandmother     Social History   Social History  . Marital status: Married    Spouse name: N/A  . Number of children: N/A  . Years of education: N/A   Occupational History  . Not on file.   Social History Main Topics  . Smoking status: Never Smoker  . Smokeless tobacco: Never Used  . Alcohol use No  . Drug use: No  . Sexual activity: No   Other Topics Concern  . Not on file   Social History Narrative  . No narrative on file    Review of Systems: See HPI, otherwise negative ROS  Physical Exam: BP 139/69   Pulse 68   Temp (!) 96.6 F (35.9 C) (Tympanic)   Resp 18   Ht 5\' 8"  (1.727 m)   Wt 78.9 kg (174 lb)   SpO2 99%   BMI 26.46 kg/m  General:   Alert,  pleasant and cooperative in NAD Head:  Normocephalic and atraumatic. Neck:  Supple; no masses or thyromegaly. Lungs:  Clear throughout to auscultation.    Heart:  Regular rate and rhythm. Abdomen:  Soft, nontender and nondistended. Normal bowel sounds, without guarding, and without rebound.   Neurologic:  Alert and  oriented x4;  grossly normal neurologically.  Impression/Plan: Edwin [name] is here for an colonoscopy to be performed for FH colon polyps in mother.  Risks, benefits, limitations, and alternatives regarding  colonoscopy have been reviewed with the patient.  Questions have been answered.  All parties agreeable.   Gaylyn Cheers, MD  05/18/2017, 10:46 AM

## 2017-05-18 NOTE — Anesthesia Procedure Notes (Signed)
Performed by: COOK-MARTIN, Cherisse Carrell Pre-anesthesia Checklist: Patient identified, Emergency Drugs available, Suction available, Patient being monitored and Timeout performed Patient Re-evaluated:Patient Re-evaluated prior to induction Oxygen Delivery Method: Nasal cannula Preoxygenation: Pre-oxygenation with 100% oxygen Induction Type: IV induction Ventilation: Oral airway inserted - appropriate to patient size Placement Confirmation: positive ETCO2 and CO2 detector       

## 2017-05-18 NOTE — Anesthesia Postprocedure Evaluation (Signed)
Anesthesia Post Note  Patient: Edwin [name]  Procedure(s) Performed: Procedure(s) (LRB): COLONOSCOPY WITH PROPOFOL (N/A)  Patient location during evaluation: Endoscopy Anesthesia Type: General Level of consciousness: awake and alert Pain management: pain level controlled Vital Signs Assessment: post-procedure vital signs reviewed and stable Respiratory status: spontaneous breathing and respiratory function stable Cardiovascular status: stable Anesthetic complications: no     Last Vitals:  Vitals:   05/18/17 1028 05/18/17 1120  BP: 139/69 102/63  Pulse: 68 70  Resp: 18 13  Temp: (!) 35.9 C (!) 35.6 C  SpO2: 99% 100%    Last Pain:  Vitals:   05/18/17 1120  TempSrc: Tympanic                 Madelaine Whipple K

## 2017-05-18 NOTE — Op Note (Signed)
[facility] Gastroenterology Patient Name: Edwin [name] Procedure Date: 05/18/2017 10:49 AM MRN: [id number] Account #: [id number] Date of Birth: 1941-05-19 Admit Type: Outpatient Age: 76 Room: [facility] ENDO ROOM 3 Gender: Male Note Status: Finalized Procedure:            Colonoscopy Indications:          Colon cancer screening in patient at increased risk:                        Family history of 1st-degree relative with colon polyps Providers:            Manya Silvas, MD Referring MD:         Einar Pheasant, MD (Referring MD) Medicines:            Propofol per Anesthesia Complications:        No immediate complications. Procedure:            Pre-Anesthesia Assessment:                       - After reviewing the risks and benefits, the patient                        was deemed in satisfactory condition to undergo the                        procedure.                       After obtaining informed consent, the colonoscope was                        passed under direct vision. Throughout the procedure,                        the patient's blood pressure, pulse, and oxygen                        saturations were monitored continuously. The                        Colonoscope was introduced through the anus and                        advanced to the the cecum, identified by appendiceal                        orifice and ileocecal valve. The colonoscopy was                        performed without difficulty. The patient tolerated the                        procedure well. The quality of the bowel preparation                        was excellent. Findings:      A small polyp was found in the rectum. The polyp was sessile. The polyp       was removed with a hot snare. Resection and retrieval were complete.      A patchy area of mildly erythematous mucosa  was found in the cecum. This       was biopsied with a cold forceps for diagnostic purposes.      Multiple  small-medium-mouthed diverticula were found in the sigmoid       colon, descending colon and transverse colon. Impression:           - One small polyp in the rectum, removed with a hot                        snare. Resected and retrieved.                       - Erythematous mucosa in the cecum. Biopsied.                       - Diverticulosis in the sigmoid colon, in the                        descending colon and in the transverse colon. Recommendation:       - Await pathology results. Manya Silvas, MD 05/18/2017 11:20:43 AM This report has been signed electronically. Number of Addenda: 0 Note Initiated On: 05/18/2017 10:49 AM Scope Withdrawal Time: 0 hours 13 minutes 18 seconds  Total Procedure Duration: 0 hours 21 minutes 33 seconds       [facility]

## 2017-05-18 NOTE — Anesthesia Preprocedure Evaluation (Signed)
Anesthesia Evaluation  Patient identified by MRN, date of birth, ID band Patient awake    Reviewed: Allergy & Precautions, NPO status , Patient's Chart, lab work & pertinent test results  History of Anesthesia Complications Negative for: history of anesthetic complications  Airway Mallampati: I       Dental   Pulmonary neg sleep apnea, neg COPD,           Cardiovascular hypertension, Pt. on medications + Cardiac Stents  (-) Past MI and (-) CHF (-) dysrhythmias (-) Valvular Problems/Murmurs     Neuro/Psych Seizures - (once time, on meds), Well Controlled,     GI/Hepatic Neg liver ROS, GERD  Medicated and Controlled,  Endo/Other  neg diabetes  Renal/GU Renal disease (stones)     Musculoskeletal   Abdominal   Peds  Hematology   Anesthesia Other Findings   Reproductive/Obstetrics                             Anesthesia Physical Anesthesia Plan  ASA: II  Anesthesia Plan: General   Post-op Pain Management:    Induction: Intravenous  PONV Risk Score and Plan: Propofol infusion  Airway Management Planned: Nasal Cannula  Additional Equipment:   Intra-op Plan:   Post-operative Plan:   Informed Consent: I have reviewed the patients History and Physical, chart, labs and discussed the procedure including the risks, benefits and alternatives for the proposed anesthesia with the patient or authorized representative who has indicated his/her understanding and acceptance.     Plan Discussed with:   Anesthesia Plan Comments:         Anesthesia Quick Evaluation

## 2017-05-18 NOTE — Anesthesia Post-op Follow-up Note (Signed)
Anesthesia QCDR form completed.        

## 2017-05-18 NOTE — Transfer of Care (Signed)
Immediate Anesthesia Transfer of Care Note  Patient: Edwin [name]  Procedure(s) Performed: Procedure(s): COLONOSCOPY WITH PROPOFOL (N/A)  Patient Location: PACU  Anesthesia Type:General  Level of Consciousness: awake and sedated  Airway & Oxygen Therapy: Patient Spontanous Breathing and Patient connected to nasal cannula oxygen  Post-op Assessment: Report given to RN and Post -op Vital signs reviewed and stable  Post vital signs: Reviewed and stable  Last Vitals:  Vitals:   05/18/17 1028  BP: 139/69  Pulse: 68  Resp: 18  Temp: (!) 35.9 C  SpO2: 99%    Last Pain:  Vitals:   05/18/17 1028  TempSrc: Tympanic         Complications: No apparent anesthesia complications

## 2017-05-19 ENCOUNTER — Encounter: Payer: Self-pay | Admitting: Unknown Physician Specialty

## 2017-05-19 LAB — SURGICAL PATHOLOGY

## 2017-06-17 ENCOUNTER — Ambulatory Visit (INDEPENDENT_AMBULATORY_CARE_PROVIDER_SITE_OTHER): Payer: Medicare HMO | Admitting: *Deleted

## 2017-06-17 DIAGNOSIS — Z23 Encounter for immunization: Secondary | ICD-10-CM

## 2017-06-24 DIAGNOSIS — L570 Actinic keratosis: Secondary | ICD-10-CM | POA: Diagnosis not present

## 2017-06-24 DIAGNOSIS — D492 Neoplasm of unspecified behavior of bone, soft tissue, and skin: Secondary | ICD-10-CM | POA: Diagnosis not present

## 2017-06-24 DIAGNOSIS — L918 Other hypertrophic disorders of the skin: Secondary | ICD-10-CM | POA: Diagnosis not present

## 2017-06-24 DIAGNOSIS — L578 Other skin changes due to chronic exposure to nonionizing radiation: Secondary | ICD-10-CM | POA: Diagnosis not present

## 2017-06-24 DIAGNOSIS — Z872 Personal history of diseases of the skin and subcutaneous tissue: Secondary | ICD-10-CM | POA: Diagnosis not present

## 2017-06-24 DIAGNOSIS — L821 Other seborrheic keratosis: Secondary | ICD-10-CM | POA: Diagnosis not present

## 2017-06-24 DIAGNOSIS — L720 Epidermal cyst: Secondary | ICD-10-CM | POA: Diagnosis not present

## 2017-09-21 ENCOUNTER — Ambulatory Visit (INDEPENDENT_AMBULATORY_CARE_PROVIDER_SITE_OTHER): Payer: Medicare HMO | Admitting: Internal Medicine

## 2017-09-21 ENCOUNTER — Encounter: Payer: Self-pay | Admitting: Internal Medicine

## 2017-09-21 DIAGNOSIS — Z9109 Other allergy status, other than to drugs and biological substances: Secondary | ICD-10-CM | POA: Diagnosis not present

## 2017-09-21 DIAGNOSIS — I2510 Atherosclerotic heart disease of native coronary artery without angina pectoris: Secondary | ICD-10-CM | POA: Diagnosis not present

## 2017-09-21 DIAGNOSIS — R439 Unspecified disturbances of smell and taste: Secondary | ICD-10-CM

## 2017-09-21 DIAGNOSIS — G40909 Epilepsy, unspecified, not intractable, without status epilepticus: Secondary | ICD-10-CM | POA: Diagnosis not present

## 2017-09-21 DIAGNOSIS — K219 Gastro-esophageal reflux disease without esophagitis: Secondary | ICD-10-CM | POA: Diagnosis not present

## 2017-09-21 DIAGNOSIS — R739 Hyperglycemia, unspecified: Secondary | ICD-10-CM | POA: Diagnosis not present

## 2017-09-21 DIAGNOSIS — E7800 Pure hypercholesterolemia, unspecified: Secondary | ICD-10-CM

## 2017-09-21 DIAGNOSIS — I10 Essential (primary) hypertension: Secondary | ICD-10-CM | POA: Diagnosis not present

## 2017-09-21 LAB — BASIC METABOLIC PANEL
BUN: 14 mg/dL (ref 6–23)
CHLORIDE: 105 meq/L (ref 96–112)
CO2: 28 mEq/L (ref 19–32)
Calcium: 9.1 mg/dL (ref 8.4–10.5)
Creatinine, Ser: 1.09 mg/dL (ref 0.40–1.50)
GFR: 69.85 mL/min (ref >60.00)
GLUCOSE: 109 mg/dL — AB (ref 70–99)
POTASSIUM: 4.2 meq/L (ref 3.5–5.1)
Sodium: 139 mEq/L (ref 135–145)

## 2017-09-21 LAB — HEPATIC FUNCTION PANEL
ALBUMIN: 4 g/dL (ref 3.5–5.2)
ALT: 17 U/L (ref 0–53)
AST: 21 U/L (ref 0–37)
Alkaline Phosphatase: 55 U/L (ref 39–117)
Bilirubin, Direct: 0.2 mg/dL (ref 0.0–0.3)
Total Bilirubin: 0.9 mg/dL (ref 0.2–1.2)
Total Protein: 7 g/dL (ref 6.0–8.3)

## 2017-09-21 LAB — LIPID PANEL
Cholesterol: 138 mg/dL (ref 0–200)
HDL: 31.5 mg/dL — ABNORMAL LOW (ref >39.00)
LDL Cholesterol: 82 mg/dL (ref 0–99)
NONHDL: 106.87
Total CHOL/HDL Ratio: 4
Triglycerides: 126 mg/dL (ref 0.0–149.0)
VLDL: 25.2 mg/dL (ref 0.0–40.0)

## 2017-09-21 LAB — HEMOGLOBIN A1C: HEMOGLOBIN A1C: 5.5 % (ref 4.6–6.5)

## 2017-09-21 NOTE — Progress Notes (Signed)
Pre visit review using our clinic review tool, if applicable. No additional management support is needed unless otherwise documented below in the visit note. 

## 2017-09-21 NOTE — Assessment & Plan Note (Signed)
Stable on current regimen   

## 2017-09-21 NOTE — Assessment & Plan Note (Signed)
Low carb diet and exercise.  Follow met b and a1c.  

## 2017-09-21 NOTE — Progress Notes (Signed)
Patient ID: Edwin [name], male   DOB: 02/17/1941, 77 y.o.   MRN: [id number]   Subjective:    Patient ID: Edwin [name], male    DOB: 12-09-1940, 77 y.o.   MRN: [id number]  HPI  Patient here for a scheduled follow up.  Has known CAD.  Sees Dr Ubaldo Glassing.  Last evaluated 05/2017.  Stable.  No changes made.  Reports he is doing relatively well.  Allergy symptoms stable.  Does report decreased taste and smell.  States that last spring, his wife was sick and then he became ill.  States he had congestion and just felt tired.  These symptoms resolved and his energy improved.  He states that since this time, he has noticed decreased sense of taste and smell.  Has never returned to normal.  Food does not taste as well.  No nausea or vomiting.  Bowels moving.  No abdominal pain.  Allergy symptoms stable.  No chest pain or sob.  Does report some hand stiffness.  Desires no further intervention.    Past Medical History:  Diagnosis Date  . Allergy   . Arthritis   . Coronary artery disease   . Diverticulosis of colon   . GERD (gastroesophageal reflux disease)    ESOPHAGEAL REFLUX  . Glaucoma   . Gout   . Hemorrhoids   . History of kidney stones   . Hx of colonic polyp   . Hypercholesterolemia   . Hypertension   . Schatzki's ring   . Seizures ([place])    Past Surgical History:  Procedure Laterality Date  . CARDIAC CATHETERIZATION    . COLONOSCOPY WITH PROPOFOL N/A 05/18/2017   Procedure: COLONOSCOPY WITH PROPOFOL;  Surgeon: Manya Silvas, MD;  Location: [facility] ENDOSCOPY;  Service: Endoscopy;  Laterality: N/A;  . CORONARY ANGIOPLASTY    . EYE SURGERY  07/20/13   cataract  . HERNIA REPAIR Right 2008 & 2012  . VASECTOMY  1984   Family History  Problem Relation Age of Onset  . Arthritis Mother   . Heart disease Mother   . Stroke Mother   . Hypertension Mother   . Arthritis Father   . Hyperlipidemia Father   . Heart disease Father   . Hypertension Father   . Parkinson's disease Father     . Multiple sclerosis Sister   . Colon cancer Maternal Grandmother   . Arthritis Paternal Grandmother   . Colon cancer Paternal Grandmother    Social History   Socioeconomic History  . Marital status: Married    Spouse name: None  . Number of children: None  . Years of education: None  . Highest education level: None  Social Needs  . Financial resource strain: None  . Food insecurity - worry: None  . Food insecurity - inability: None  . Transportation needs - medical: None  . Transportation needs - non-medical: None  Occupational History  . None  Tobacco Use  . Smoking status: Never Smoker  . Smokeless tobacco: Never Used  Substance and Sexual Activity  . Alcohol use: No    Alcohol/week: 0.0 oz  . Drug use: No  . Sexual activity: No  Other Topics Concern  . None  Social History Narrative  . None    Outpatient Encounter Medications as of 09/21/2017  Medication Sig  . aspirin 81 MG tablet Take 81 mg by mouth at bedtime.  Marland Kitchen azelastine (ASTELIN) 0.1 % nasal spray Place 1 spray into both nostrils 2 (two) times daily.  Marland Kitchen  b complex vitamins tablet Take 1 tablet by mouth daily.  . Cholecalciferol (VITAMIN D3) 2000 UNITS TABS Take by mouth every morning.  . ciclopirox (PENLAC) 8 % solution Apply topically at bedtime. Apply over nail and surrounding skin. Apply daily over previous coat. After seven (7) days, may remove with alcohol and continue cycle.  . clobetasol cream (TEMOVATE) 7.42 % Apply 1 application topically 2 (two) times daily. Do not use on face or genital area.  Do not use for more 7-10 days in the same place.  . clopidogrel (PLAVIX) 75 MG tablet Take 75 mg by mouth daily.  . colchicine 0.6 MG tablet Take 0.6 mg by mouth daily as needed.  . latanoprost (XALATAN) 0.005 % ophthalmic solution Place 1 drop into both eyes at bedtime.  . levETIRAcetam (KEPPRA) 500 MG tablet Take 500 mg by mouth daily.   Marland Kitchen levocetirizine (XYZAL) 5 MG tablet Take 5 mg by mouth at bedtime as  needed for allergies.  Marland Kitchen loratadine (CLARITIN) 10 MG tablet Take 10 mg by mouth daily.  . Magnesium 400 MG CAPS Take by mouth at bedtime.  . metoprolol tartrate (LOPRESSOR) 25 MG tablet TAKE 1/2 TABLET AT BEDTIME  . Misc Natural Products (TART CHERRY ADVANCED PO) Take 500 mg by mouth every morning.  . Multiple Vitamin (MULTIVITAMIN) tablet Take 1 tablet by mouth daily.  . mupirocin ointment (BACTROBAN) 2 % Place 1 application into the nose 2 (two) times daily.  . Omega-3 Fatty Acids (FISH OIL) 1200 MG CAPS Take by mouth 2 (two) times daily.  Marland Kitchen omeprazole (PRILOSEC) 20 MG capsule TAKE 1 CAPSULE TWICE DAILY BEFORE MEALS  . polyethylene glycol (MIRALAX / GLYCOLAX) packet Take 17 g by mouth at bedtime.  . Psyllium (METAMUCIL PO) Take by mouth every morning.  . pyridOXINE (VITAMIN B-6) 100 MG tablet Take 100 mg by mouth daily.  . Saw Palmetto 450 MG CAPS Take by mouth every morning.  . simvastatin (ZOCOR) 40 MG tablet Take 40 mg by mouth daily.  . Triamcinolone Acetonide (NASACORT ALLERGY 24HR NA) Place 1 spray into the nose daily.   No facility-administered encounter medications on file as of 09/21/2017.     Review of Systems  Constitutional: Negative for unexpected weight change.       Decreased taste and smell.  Still eating, but reports food not tasting as well.    HENT: Negative for congestion and sinus pressure.   Respiratory: Negative for cough, chest tightness and shortness of breath.   Cardiovascular: Negative for chest pain, palpitations and leg swelling.  Gastrointestinal: Negative for abdominal pain, diarrhea, nausea and vomiting.  Genitourinary: Negative for difficulty urinating and dysuria.  Musculoskeletal: Negative for myalgias.       Hand stiffness as outlined.    Skin: Negative for color change and rash.  Neurological: Negative for dizziness, light-headedness and headaches.  Psychiatric/Behavioral: Negative for agitation and dysphoric mood.       Objective:      Physical Exam  Constitutional: He appears well-developed and well-nourished. No distress.  HENT:  Nose: Nose normal.  Mouth/Throat: Oropharynx is clear and moist.  Neck: Neck supple. No thyromegaly present.  Cardiovascular: Normal rate and regular rhythm.  Pulmonary/Chest: Effort normal and breath sounds normal. No respiratory distress.  Abdominal: Soft. Bowel sounds are normal. There is no tenderness.  Musculoskeletal: He exhibits no edema or tenderness.  Lymphadenopathy:    He has no cervical adenopathy.  Skin: No rash noted. No erythema.  Psychiatric: He has a normal mood  and affect. His behavior is normal.    BP 122/68   Pulse 64   Temp 98.3 F (36.8 C) (Oral)   Ht 5' 8"  (1.727 m)   Wt 177 lb 3.2 oz (80.4 kg)   SpO2 98%   BMI 26.94 kg/m  Wt Readings from Last 3 Encounters:  09/21/17 177 lb 3.2 oz (80.4 kg)  05/18/17 174 lb (78.9 kg)  03/19/17 174 lb 12.8 oz (79.3 kg)     Lab Results  Component Value Date   WBC 6.0 03/19/2017   HGB 14.9 03/19/2017   HCT 43.6 03/19/2017   PLT 236.0 03/19/2017   GLUCOSE 109 (H) 09/21/2017   CHOL 138 09/21/2017   TRIG 126.0 09/21/2017   HDL 31.50 (L) 09/21/2017   LDLDIRECT 99.1 07/29/2013   LDLCALC 82 09/21/2017   ALT 17 09/21/2017   AST 21 09/21/2017   NA 139 09/21/2017   K 4.2 09/21/2017   CL 105 09/21/2017   CREATININE 1.09 09/21/2017   BUN 14 09/21/2017   CO2 28 09/21/2017   TSH 1.66 03/19/2017   PSA 1.08 03/19/2017   HGBA1C 5.5 09/21/2017       Assessment & Plan:   Problem List Items Addressed This Visit    CAD (coronary artery disease)    Followed by Dr Ubaldo Glassing.  Last evaluated 05/2017.  Stable.  Continue risk factor modification.        Decreased taste and smell    Persistent issue now for months.  Acid reflux controlled.  Allergy symptoms stable.  Refer to ENT for evaluation.        Relevant Orders   Ambulatory referral to ENT   Environmental allergies    Stable on current regimen.        Essential  hypertension, benign    Blood pressure under good control.  Continue same medication regimen.  Follow pressures.  Follow metabolic panel.        GERD (gastroesophageal reflux disease)    Controlled on current regimen.        Hypercholesterolemia    On simvastatin.  Low cholesterol diet and exercise.  Follow lipid panel and liver function tests.        Relevant Orders   Lipid panel (Completed)   Hepatic function panel (Completed)   Hyperglycemia    Low carb diet and exercise.  Follow met b and a1c.       Relevant Orders   Basic metabolic panel (Completed)   Hemoglobin A1c (Completed)   Seizure disorder ([place])    Has been followed by neurology.  Tapered keppra.  Now on 588m q day.  Doing well.  Follow.           SEinar Pheasant MD

## 2017-09-21 NOTE — Assessment & Plan Note (Signed)
Followed by Dr Ubaldo Glassing.  Last evaluated 05/2017.  Stable.  Continue risk factor modification.

## 2017-09-21 NOTE — Assessment & Plan Note (Signed)
On simvastatin.  Low cholesterol diet and exercise.  Follow lipid panel and liver function tests.   

## 2017-09-22 ENCOUNTER — Encounter: Payer: Self-pay | Admitting: Internal Medicine

## 2017-09-23 ENCOUNTER — Encounter: Payer: Self-pay | Admitting: Internal Medicine

## 2017-09-23 NOTE — Assessment & Plan Note (Signed)
Controlled on current regimen.   

## 2017-09-23 NOTE — Assessment & Plan Note (Signed)
Blood pressure under good control.  Continue same medication regimen.  Follow pressures.  Follow metabolic panel.   

## 2017-09-23 NOTE — Assessment & Plan Note (Signed)
Persistent issue now for months.  Acid reflux controlled.  Allergy symptoms stable.  Refer to ENT for evaluation.

## 2017-09-23 NOTE — Assessment & Plan Note (Signed)
Has been followed by neurology.  Tapered keppra.  Now on 500mg  q day.  Doing well.  Follow.

## 2017-10-02 ENCOUNTER — Other Ambulatory Visit: Payer: Self-pay | Admitting: Otolaryngology

## 2017-10-02 DIAGNOSIS — J301 Allergic rhinitis due to pollen: Secondary | ICD-10-CM | POA: Diagnosis not present

## 2017-10-02 DIAGNOSIS — R430 Anosmia: Secondary | ICD-10-CM

## 2017-10-09 ENCOUNTER — Ambulatory Visit
Admission: RE | Admit: 2017-10-09 | Discharge: 2017-10-09 | Disposition: A | Discharge status: Home / Self Care | Payer: Medicare HMO | Source: Ambulatory Visit | Attending: Otolaryngology | Admitting: Otolaryngology

## 2017-10-09 DIAGNOSIS — J322 Chronic ethmoidal sinusitis: Secondary | ICD-10-CM | POA: Insufficient documentation

## 2017-10-09 DIAGNOSIS — R430 Anosmia: Secondary | ICD-10-CM | POA: Diagnosis not present

## 2017-10-09 DIAGNOSIS — I6782 Cerebral ischemia: Secondary | ICD-10-CM | POA: Diagnosis not present

## 2017-10-09 MED ORDER — GADOBENATE DIMEGLUMINE 529 MG/ML IV SOLN
15.0000 mL | Freq: Once | INTRAVENOUS | Status: AC | PRN
  Administered 2017-10-09: 15 mL via INTRAVENOUS

## 2017-10-14 ENCOUNTER — Ambulatory Visit (INDEPENDENT_AMBULATORY_CARE_PROVIDER_SITE_OTHER): Payer: Medicare HMO

## 2017-10-14 VITALS — BP 118/62 | HR 73 | Temp 98.3°F | Resp 14 | Ht 68.0 in | Wt 174.8 lb

## 2017-10-14 DIAGNOSIS — Z0000 Encounter for general adult medical examination without abnormal findings: Secondary | ICD-10-CM | POA: Diagnosis not present

## 2017-10-14 NOTE — Progress Notes (Signed)
Subjective:   Edwin [name] is a 77 y.o. male who presents for Medicare Annual/Subsequent preventive examination.  Review of Systems:  No ROS.  Medicare Wellness Visit. Additional risk factors are reflected in the social history.  Cardiac Risk Factors include: advanced age (>6men, >10 women);male gender;hypertension     Objective:    Vitals: BP 118/62 (BP Location: Left Arm, Patient Position: Sitting, Cuff Size: Normal)   Pulse 73   Temp 98.3 F (36.8 C) (Oral)   Resp 14   Ht 5\' 8"  (1.727 m)   Wt 174 lb 12.8 oz (79.3 kg)   SpO2 98%   BMI 26.58 kg/m   Body mass index is 26.58 kg/m.  Advanced Directives 10/14/2017 05/18/2017 10/13/2016 10/12/2015  Does Patient Have a Medical Advance Directive? Yes Yes Yes No  Type of Paramedic of [place];Living will [place] -  Does patient want to make changes to medical advance directive? No - Patient declined - No - Patient declined -  Copy of [place] in Chart? No - copy requested No - copy requested No - copy requested -  Would patient like information on creating a medical advance directive? - - - Yes - Scientist, clinical (histocompatibility and immunogenetics) given    Tobacco Social History   Tobacco Use  Smoking Status Never Smoker  Smokeless Tobacco Never Used     Counseling given: Not Answered   Clinical Intake:  Pre-visit preparation completed: Yes  Pain : No/denies pain     Nutritional Status: BMI 25 -29 Overweight Diabetes: No  How often do you need to have someone help you when you read instructions, pamphlets, or other written materials from your doctor or pharmacy?: 1 - Never  Interpreter Needed?: No     Past Medical History:  Diagnosis Date  . Allergy   . Arthritis   . Coronary artery disease   . Diverticulosis of colon   . GERD (gastroesophageal reflux disease)    ESOPHAGEAL REFLUX  . Glaucoma   . Gout   . Hemorrhoids   .  History of kidney stones   . Hx of colonic polyp   . Hypercholesterolemia   . Hypertension   . Schatzki's ring   . Seizures ([place])    Past Surgical History:  Procedure Laterality Date  . CARDIAC CATHETERIZATION    . COLONOSCOPY WITH PROPOFOL N/A 05/18/2017   Procedure: COLONOSCOPY WITH PROPOFOL;  Surgeon: Manya Silvas, MD;  Location: [facility] ENDOSCOPY;  Service: Endoscopy;  Laterality: N/A;  . CORONARY ANGIOPLASTY    . EYE SURGERY  07/20/13   cataract  . HERNIA REPAIR Right 2008 & 2012  . VASECTOMY  1984   Family History  Problem Relation Age of Onset  . Arthritis Mother   . Heart disease Mother   . Stroke Mother   . Hypertension Mother   . Arthritis Father   . Hyperlipidemia Father   . Heart disease Father   . Hypertension Father   . Parkinson's disease Father   . Multiple sclerosis Sister   . Colon cancer Maternal Grandmother   . Arthritis Paternal Grandmother   . Colon cancer Paternal Grandmother    Social History   Socioeconomic History  . Marital status: Married    Spouse name: None  . Number of children: None  . Years of education: None  . Highest education level: None  Social Needs  . Financial resource strain: None  . Food insecurity -  worry: None  . Food insecurity - inability: None  . Transportation needs - medical: None  . Transportation needs - non-medical: None  Occupational History  . None  Tobacco Use  . Smoking status: Never Smoker  . Smokeless tobacco: Never Used  Substance and Sexual Activity  . Alcohol use: No    Alcohol/week: 0.0 oz  . Drug use: No  . Sexual activity: No  Other Topics Concern  . None  Social History Narrative  . None    Outpatient Encounter Medications as of 10/14/2017  Medication Sig  . aspirin 81 MG tablet Take 81 mg by mouth at bedtime.  Marland Kitchen azelastine (ASTELIN) 0.1 % nasal spray Place 1 spray into both nostrils 2 (two) times daily.  Marland Kitchen b complex vitamins tablet Take 1 tablet by mouth daily.  . Cholecalciferol  (VITAMIN D3) 2000 UNITS TABS Take by mouth every morning.  . ciclopirox (PENLAC) 8 % solution Apply topically at bedtime. Apply over nail and surrounding skin. Apply daily over previous coat. After seven (7) days, may remove with alcohol and continue cycle.  . clobetasol cream (TEMOVATE) 4.27 % Apply 1 application topically 2 (two) times daily. Do not use on face or genital area.  Do not use for more 7-10 days in the same place.  . clopidogrel (PLAVIX) 75 MG tablet Take 75 mg by mouth daily.  . colchicine 0.6 MG tablet Take 0.6 mg by mouth daily as needed.  . latanoprost (XALATAN) 0.005 % ophthalmic solution Place 1 drop into both eyes at bedtime.  . levETIRAcetam (KEPPRA) 500 MG tablet Take 500 mg by mouth daily.   Marland Kitchen loratadine (CLARITIN) 10 MG tablet Take 10 mg by mouth daily.  . Magnesium 400 MG CAPS Take by mouth at bedtime.  . Misc Natural Products (TART CHERRY ADVANCED PO) Take 500 mg by mouth every morning.  . Multiple Vitamin (MULTIVITAMIN) tablet Take 1 tablet by mouth daily.  . Omega-3 Fatty Acids (FISH OIL) 1200 MG CAPS Take by mouth 2 (two) times daily.  Marland Kitchen omeprazole (PRILOSEC) 20 MG capsule TAKE 1 CAPSULE TWICE DAILY BEFORE MEALS  . polyethylene glycol (MIRALAX / GLYCOLAX) packet Take 17 g by mouth at bedtime.  . Psyllium (METAMUCIL PO) Take by mouth every morning.  . pyridOXINE (VITAMIN B-6) 100 MG tablet Take 100 mg by mouth daily.  . Saw Palmetto 450 MG CAPS Take by mouth every morning.  . simvastatin (ZOCOR) 40 MG tablet Take 40 mg by mouth daily.  . [DISCONTINUED] levocetirizine (XYZAL) 5 MG tablet Take 5 mg by mouth at bedtime as needed for allergies.  . [DISCONTINUED] metoprolol tartrate (LOPRESSOR) 25 MG tablet TAKE 1/2 TABLET AT BEDTIME  . [DISCONTINUED] mupirocin ointment (BACTROBAN) 2 % Place 1 application into the nose 2 (two) times daily.  . [DISCONTINUED] Triamcinolone Acetonide (NASACORT ALLERGY 24HR NA) Place 1 spray into the nose daily.   No facility-administered  encounter medications on file as of 10/14/2017.     Activities of Daily Living In your present state of health, do you have any difficulty performing the following activities: 10/14/2017  Hearing? Y  Vision? N  Difficulty concentrating or making decisions? N  Walking or climbing stairs? N  Dressing or bathing? N  Doing errands, shopping? N  Preparing Food and eating ? N  Using the Toilet? N  In the past six months, have you accidently leaked urine? N  Do you have problems with loss of bowel control? N  Managing your Medications? N  Managing your  Finances? N  Housekeeping or managing your Housekeeping? N  Some recent data might be hidden    Patient Care Team: Einar Pheasant, MD as PCP - General (Internal Medicine)   Assessment:   This is a routine wellness examination for Seydou.  The goal of the wellness visit is to assist the patient how to close the gaps in care and create a preventative care plan for the patient.   The roster of all physicians providing medical care to patient is listed in the Snapshot section of the chart.  Taking calcium VIT D as appropriate/Osteoporosis risk reviewed.    Safety issues reviewed; Smoke and carbon monoxide detectors in the home. No firearms or firearms locked in a safe within the home. Wears seatbelts when driving or riding with others. No violence in the home.  They do not have excessive sun exposure.  Discussed the need for sun protection: hats, long sleeves and the use of sunscreen if there is significant sun exposure.  Patient is alert, normal appearance, oriented to person/place/and time.  Correctly identified the president of the [place] and recalls of 2/3 words. Performs simple calculations and can read correct time from watch face.  Displays appropriate judgement.  No new identified risk were noted.  No failures at ADL's or IADL's.  BMI- discussed the importance of a healthy diet, water intake and the benefits of aerobic exercise.  Educational material provided.   24 hour diet recall: Regular diet  Daily fluid intake: 0 cups of caffeine, 4-6 cups of water  Dental- every 6 months.  Eye- Visual acuity not assessed per patient preference since they have regular follow up with the ophthalmologist.  Wears corrective lenses.  Sleep patterns- Sleeps 6-8 hours at night.  Wakes feeling rested.  Health maintenance gaps- closed.  Patient Concerns: None at this time. Follow up with PCP as needed.  Exercise Activities and Dietary recommendations Current Exercise Habits: Home exercise routine, Type of exercise: walking, Time (Minutes): 20, Frequency (Times/Week): 4, Weekly Exercise (Minutes/Week): 80, Intensity: Moderate  Goals    . Exercise 5x per week (45min per time)     Patient centered goal is to walk up to 90 min daily, 5 days per week to reach target weight of 165 lbs, weather permitting.        Fall Risk Fall Risk  10/14/2017 09/21/2017 03/19/2017 10/13/2016 03/12/2016  Falls in the past year? No No No No No   Depression Screen PHQ 2/9 Scores 10/14/2017 09/21/2017 03/19/2017 10/13/2016  PHQ - 2 Score 0 0 0 0  PHQ- 9 Score - - 1 -    Cognitive Function MMSE - Mini Mental State Exam 10/14/2017 10/12/2015  Orientation to time 5 5  Orientation to Place 5 5  Registration 3 3  Attention/ Calculation 5 5  Recall 2 3  Language- name 2 objects 2 2  Language- repeat 1 1  Language- follow 3 step command 3 3  Language- read & follow direction 1 1  Write a sentence 1 1  Copy design 1 1  Total score 29 30     6CIT Screen 10/13/2016  What Year? 0 points  What month? 0 points  What time? 0 points  Count back from 20 0 points  Months in reverse 0 points  Repeat phrase 0 points  Total Score 0    Immunization History  Administered Date(s) Administered  . Influenza, High Dose Seasonal PF 06/12/2016, 06/17/2017  . Influenza,inj,Quad PF,6+ Mos 07/29/2013, 06/20/2014, 07/18/2015  . Pneumococcal Conjugate-13  08/09/2013  . Pneumococcal Polysaccharide-23 07/18/2015  . Tdap 09/01/2004, 07/05/2014  . Zoster 08/29/2013   Screening Tests Health Maintenance  Topic Date Due  . TETANUS/TDAP  07/05/2024  . INFLUENZA VACCINE  Completed  . PNA vac Low Risk Adult  Completed    Plan:    End of life planning; Advance aging; Advanced directives discussed. Copy of current HCPOA/Living Will requested.    I have personally reviewed and noted the following in the patient's chart:   . Medical and social history . Use of alcohol, tobacco or illicit drugs  . Current medications and supplements . Functional ability and status . Nutritional status . Physical activity . Advanced directives . List of other physicians . Hospitalizations, surgeries, and ER visits in previous 12 months . Vitals . Screenings to include cognitive, depression, and falls . Referrals and appointments  In addition, I have reviewed and discussed with patient certain preventive protocols, quality metrics, and best practice recommendations. A written personalized care plan for preventive services as well as general preventive health recommendations were provided to patient.     Varney Biles, LPN  05/17/9449   Reviewed above information.  Agree with assessment and plan.    Dr Nicki Reaper

## 2017-10-14 NOTE — Patient Instructions (Addendum)
  Edwin [name] , Thank you for taking time to come for your Medicare Wellness Visit. I appreciate your ongoing commitment to your health goals. Please review the following plan we discussed and let me know if I can assist you in the future.   Follow up with Dr. Nicki Reaper as needed.    Bring a copy of your [place] and/or Living Will to be scanned into chart.  Have a great day and stay warm!  These are the goals we discussed: Goals    . Exercise 5x per week (24min per time)     Patient centered goal is to walk up to 90 min daily, 5 days per week to reach target weight of 165 lbs, weather permitting.        This is a list of the screening recommended for you and due dates:  Health Maintenance  Topic Date Due  . Tetanus Vaccine  07/05/2024  . Flu Shot  Completed  . Pneumonia vaccines  Completed

## 2017-11-02 DIAGNOSIS — H40003 Preglaucoma, unspecified, bilateral: Secondary | ICD-10-CM | POA: Diagnosis not present

## 2017-11-16 DIAGNOSIS — I10 Essential (primary) hypertension: Secondary | ICD-10-CM | POA: Diagnosis not present

## 2017-11-16 DIAGNOSIS — Z9861 Coronary angioplasty status: Secondary | ICD-10-CM | POA: Diagnosis not present

## 2017-11-16 DIAGNOSIS — I2510 Atherosclerotic heart disease of native coronary artery without angina pectoris: Secondary | ICD-10-CM | POA: Diagnosis not present

## 2017-11-16 DIAGNOSIS — E7800 Pure hypercholesterolemia, unspecified: Secondary | ICD-10-CM | POA: Diagnosis not present

## 2017-11-16 DIAGNOSIS — R0789 Other chest pain: Secondary | ICD-10-CM | POA: Diagnosis not present

## 2017-11-17 DIAGNOSIS — L570 Actinic keratosis: Secondary | ICD-10-CM | POA: Diagnosis not present

## 2017-11-17 DIAGNOSIS — D492 Neoplasm of unspecified behavior of bone, soft tissue, and skin: Secondary | ICD-10-CM | POA: Diagnosis not present

## 2017-11-17 DIAGNOSIS — D225 Melanocytic nevi of trunk: Secondary | ICD-10-CM | POA: Diagnosis not present

## 2017-11-17 DIAGNOSIS — L918 Other hypertrophic disorders of the skin: Secondary | ICD-10-CM | POA: Diagnosis not present

## 2017-11-17 DIAGNOSIS — L739 Follicular disorder, unspecified: Secondary | ICD-10-CM | POA: Diagnosis not present

## 2017-12-21 DIAGNOSIS — R9082 White matter disease, unspecified: Secondary | ICD-10-CM | POA: Diagnosis not present

## 2017-12-21 DIAGNOSIS — R259 Unspecified abnormal involuntary movements: Secondary | ICD-10-CM | POA: Diagnosis not present

## 2017-12-21 DIAGNOSIS — G4733 Obstructive sleep apnea (adult) (pediatric): Secondary | ICD-10-CM | POA: Diagnosis not present

## 2017-12-21 DIAGNOSIS — Z87898 Personal history of other specified conditions: Secondary | ICD-10-CM | POA: Diagnosis not present

## 2018-02-08 ENCOUNTER — Other Ambulatory Visit: Payer: Self-pay | Admitting: Internal Medicine

## 2018-03-25 ENCOUNTER — Encounter: Payer: Medicare HMO | Admitting: Internal Medicine

## 2018-05-04 DIAGNOSIS — H40053 Ocular hypertension, bilateral: Secondary | ICD-10-CM | POA: Diagnosis not present

## 2018-05-11 DIAGNOSIS — H40003 Preglaucoma, unspecified, bilateral: Secondary | ICD-10-CM | POA: Diagnosis not present

## 2018-05-19 DIAGNOSIS — Z9861 Coronary angioplasty status: Secondary | ICD-10-CM | POA: Diagnosis not present

## 2018-05-19 DIAGNOSIS — I10 Essential (primary) hypertension: Secondary | ICD-10-CM | POA: Diagnosis not present

## 2018-05-19 DIAGNOSIS — E7800 Pure hypercholesterolemia, unspecified: Secondary | ICD-10-CM | POA: Diagnosis not present

## 2018-05-19 DIAGNOSIS — I2510 Atherosclerotic heart disease of native coronary artery without angina pectoris: Secondary | ICD-10-CM | POA: Diagnosis not present

## 2018-06-23 ENCOUNTER — Ambulatory Visit (INDEPENDENT_AMBULATORY_CARE_PROVIDER_SITE_OTHER): Payer: Medicare HMO | Admitting: Internal Medicine

## 2018-06-23 VITALS — BP 134/86 | HR 64 | Temp 97.8°F | Resp 18 | Ht 68.0 in | Wt 173.6 lb

## 2018-06-23 DIAGNOSIS — Z9109 Other allergy status, other than to drugs and biological substances: Secondary | ICD-10-CM

## 2018-06-23 DIAGNOSIS — Z0000 Encounter for general adult medical examination without abnormal findings: Secondary | ICD-10-CM

## 2018-06-23 DIAGNOSIS — R739 Hyperglycemia, unspecified: Secondary | ICD-10-CM | POA: Diagnosis not present

## 2018-06-23 DIAGNOSIS — I10 Essential (primary) hypertension: Secondary | ICD-10-CM

## 2018-06-23 DIAGNOSIS — K219 Gastro-esophageal reflux disease without esophagitis: Secondary | ICD-10-CM

## 2018-06-23 DIAGNOSIS — Z23 Encounter for immunization: Secondary | ICD-10-CM

## 2018-06-23 DIAGNOSIS — E7800 Pure hypercholesterolemia, unspecified: Secondary | ICD-10-CM | POA: Diagnosis not present

## 2018-06-23 DIAGNOSIS — G40909 Epilepsy, unspecified, not intractable, without status epilepticus: Secondary | ICD-10-CM

## 2018-06-23 DIAGNOSIS — Z125 Encounter for screening for malignant neoplasm of prostate: Secondary | ICD-10-CM

## 2018-06-23 DIAGNOSIS — I2510 Atherosclerotic heart disease of native coronary artery without angina pectoris: Secondary | ICD-10-CM

## 2018-06-23 LAB — CBC WITH DIFFERENTIAL/PLATELET
BASOS ABS: 0 10*3/uL (ref 0.0–0.1)
Basophils Relative: 0.6 % (ref 0.0–3.0)
Eosinophils Absolute: 0.2 10*3/uL (ref 0.0–0.7)
Eosinophils Relative: 3.6 % (ref 0.0–5.0)
HCT: 43.4 % (ref 39.0–52.0)
HEMOGLOBIN: 15.2 g/dL (ref 13.0–17.0)
Lymphocytes Relative: 25.1 % (ref 12.0–46.0)
Lymphs Abs: 1.4 10*3/uL (ref 0.7–4.0)
MCHC: 35.1 g/dL (ref 30.0–36.0)
MCV: 85 fl (ref 78.0–100.0)
MONO ABS: 0.5 10*3/uL (ref 0.1–1.0)
MONOS PCT: 9.2 % (ref 3.0–12.0)
Neutro Abs: 3.5 10*3/uL (ref 1.4–7.7)
Neutrophils Relative %: 61.5 % (ref 43.0–77.0)
Platelets: 236 10*3/uL (ref 150.0–400.0)
RBC: 5.1 Mil/uL (ref 4.22–5.81)
RDW: 13.5 % (ref 11.5–15.5)
WBC: 5.6 10*3/uL (ref 4.0–10.5)

## 2018-06-23 LAB — LIPID PANEL
CHOL/HDL RATIO: 5
Cholesterol: 148 mg/dL (ref 0–200)
HDL: 31.7 mg/dL — ABNORMAL LOW (ref >39.00)
LDL Cholesterol: 80 mg/dL (ref 0–99)
NONHDL: 115.82
Triglycerides: 177 mg/dL — ABNORMAL HIGH (ref 0.0–149.0)
VLDL: 35.4 mg/dL (ref 0.0–40.0)

## 2018-06-23 LAB — BASIC METABOLIC PANEL
BUN: 15 mg/dL (ref 6–23)
CO2: 28 mEq/L (ref 19–32)
Calcium: 9.5 mg/dL (ref 8.4–10.5)
Chloride: 104 mEq/L (ref 96–112)
Creatinine, Ser: 1.09 mg/dL (ref 0.40–1.50)
GFR: 69.72 mL/min (ref >60.00)
Glucose, Bld: 100 mg/dL — ABNORMAL HIGH (ref 70–99)
POTASSIUM: 4.3 meq/L (ref 3.5–5.1)
SODIUM: 139 meq/L (ref 135–145)

## 2018-06-23 LAB — TSH: TSH: 1.31 u[IU]/mL (ref 0.35–4.50)

## 2018-06-23 LAB — PSA, MEDICARE: PSA: 1.05 ng/mL (ref 0.10–4.00)

## 2018-06-23 LAB — HEPATIC FUNCTION PANEL
ALK PHOS: 61 U/L (ref 39–117)
ALT: 17 U/L (ref 0–53)
AST: 21 U/L (ref 0–37)
Albumin: 4.3 g/dL (ref 3.5–5.2)
Bilirubin, Direct: 0.2 mg/dL (ref 0.0–0.3)
TOTAL PROTEIN: 7.2 g/dL (ref 6.0–8.3)
Total Bilirubin: 1.6 mg/dL — ABNORMAL HIGH (ref 0.2–1.2)

## 2018-06-23 LAB — HEMOGLOBIN A1C: HEMOGLOBIN A1C: 5.3 % (ref 4.6–6.5)

## 2018-06-23 NOTE — Progress Notes (Signed)
Patient ID: Edwin [name], male   DOB: [birth year], 77 y.o.   MRN: [id number]   Subjective:    Patient ID: Edwin [name], male    DOB: 1941-05-02, 77 y.o.   MRN: [id number]  HPI  Patient here for his physical exam. Saw cardiology 05/2018 for f/u CAD.  Stable.  Recommended f/u in 6 months.  Also sees neurology.  Seizure - stable.  Continues on Keppra.  Recommended continue daily aspirin.  Overall doing well.  Trying to stay active.  No chest pain.  No sob.  No acid reflux.  No abdominal pain.  Bowels moving.  No urine change.  Takes allegra for allergies.  Discussed OSA.  Desires no further treatment.  States feels rested when awakens.  Follow.     Past Medical History:  Diagnosis Date  . Allergy   . Arthritis   . Coronary artery disease   . Diverticulosis of colon   . GERD (gastroesophageal reflux disease)    ESOPHAGEAL REFLUX  . Glaucoma   . Gout   . Hemorrhoids   . History of kidney stones   . Hx of colonic polyp   . Hypercholesterolemia   . Hypertension   . Schatzki's ring   . Seizures ([place])    Past Surgical History:  Procedure Laterality Date  . CARDIAC CATHETERIZATION    . COLONOSCOPY WITH PROPOFOL N/A 05/18/2017   Procedure: COLONOSCOPY WITH PROPOFOL;  Surgeon: Manya Silvas, MD;  Location: [facility] ENDOSCOPY;  Service: Endoscopy;  Laterality: N/A;  . CORONARY ANGIOPLASTY    . EYE SURGERY  07/20/13   cataract  . HERNIA REPAIR Right 2008 & 2012  . VASECTOMY  1984   Family History  Problem Relation Age of Onset  . Arthritis Mother   . Heart disease Mother   . Stroke Mother   . Hypertension Mother   . Arthritis Father   . Hyperlipidemia Father   . Heart disease Father   . Hypertension Father   . Parkinson's disease Father   . Multiple sclerosis Sister   . Colon cancer Maternal Grandmother   . Arthritis Paternal Grandmother   . Colon cancer Paternal Grandmother    Social History   Socioeconomic History  . Marital status: Married    Spouse name: Not on  file  . Number of children: Not on file  . Years of education: Not on file  . Highest education level: Not on file  Occupational History  . Not on file  Social Needs  . Financial resource strain: Not on file  . Food insecurity:    Worry: Not on file    Inability: Not on file  . Transportation needs:    Medical: Not on file    Non-medical: Not on file  Tobacco Use  . Smoking status: Never Smoker  . Smokeless tobacco: Never Used  Substance and Sexual Activity  . Alcohol use: No    Alcohol/week: 0.0 standard drinks  . Drug use: No  . Sexual activity: Never  Lifestyle  . Physical activity:    Days per week: Not on file    Minutes per session: Not on file  . Stress: Not on file  Relationships  . Social connections:    Talks on phone: Not on file    Gets together: Not on file    Attends religious service: Not on file    Active member of club or organization: Not on file    Attends meetings of clubs or organizations: Not on  file    Relationship status: Not on file  Other Topics Concern  . Not on file  Social History Narrative  . Not on file    Outpatient Encounter Medications as of 06/23/2018  Medication Sig  . Alpha-Lipoic Acid 200 MG CAPS Take by mouth.  Marland Kitchen aspirin 81 MG tablet Take 81 mg by mouth at bedtime.  Marland Kitchen azelastine (ASTELIN) 0.1 % nasal spray Place 1 spray into both nostrils 2 (two) times daily.  . Cholecalciferol (VITAMIN D3) 2000 UNITS TABS Take by mouth every morning.  . ciclopirox (PENLAC) 8 % solution Apply topically at bedtime. Apply over nail and surrounding skin. Apply daily over previous coat. After seven (7) days, may remove with alcohol and continue cycle.  . clobetasol cream (TEMOVATE) 0.01 % Apply 1 application topically 2 (two) times daily. Do not use on face or genital area.  Do not use for more 7-10 days in the same place.  . clopidogrel (PLAVIX) 75 MG tablet Take 75 mg by mouth daily.  . colchicine 0.6 MG tablet Take 0.6 mg by mouth daily as  needed.  . latanoprost (XALATAN) 0.005 % ophthalmic solution Place 1 drop into both eyes at bedtime.  . levETIRAcetam (KEPPRA) 500 MG tablet Take 500 mg by mouth daily.   Marland Kitchen loratadine (CLARITIN) 10 MG tablet Take 10 mg by mouth daily.  . Magnesium 400 MG CAPS Take by mouth at bedtime.  . Misc Natural Products (TART CHERRY ADVANCED PO) Take 500 mg by mouth every morning.  . Multiple Vitamin (MULTIVITAMIN) tablet Take 1 tablet by mouth daily.  . Omega-3 Fatty Acids (FISH OIL) 1200 MG CAPS Take by mouth 2 (two) times daily.  Marland Kitchen omeprazole (PRILOSEC) 20 MG capsule TAKE 1 CAPSULE TWICE DAILY BEFORE MEALS  . polyethylene glycol (MIRALAX / GLYCOLAX) packet Take 17 g by mouth at bedtime.  . Psyllium (METAMUCIL PO) Take by mouth every morning.  . Saw Palmetto 450 MG CAPS Take by mouth every morning.  . simvastatin (ZOCOR) 40 MG tablet Take 40 mg by mouth daily.  . [DISCONTINUED] b complex vitamins tablet Take 1 tablet by mouth daily.  . [DISCONTINUED] pyridOXINE (VITAMIN B-6) 100 MG tablet Take 100 mg by mouth daily.   No facility-administered encounter medications on file as of 06/23/2018.     Review of Systems  Constitutional: Negative for appetite change and unexpected weight change.  HENT: Negative for congestion and sinus pressure.   Eyes: Negative for pain and visual disturbance.  Respiratory: Negative for cough, chest tightness and shortness of breath.   Cardiovascular: Negative for chest pain, palpitations and leg swelling.  Gastrointestinal: Negative for abdominal pain, diarrhea, nausea and vomiting.  Genitourinary: Negative for difficulty urinating and dysuria.  Musculoskeletal: Negative for joint swelling and myalgias.  Skin: Negative for color change and rash.  Neurological: Negative for dizziness, light-headedness and headaches.  Hematological: Negative for adenopathy. Does not bruise/bleed easily.  Psychiatric/Behavioral: Negative for agitation and dysphoric mood.         Objective:     Blood pressure rechecked by me:  134/78-80  Physical Exam  Constitutional: He is oriented to person, place, and time. He appears well-developed and well-nourished. No distress.  HENT:  Head: Normocephalic and atraumatic.  Nose: Nose normal.  Mouth/Throat: Oropharynx is clear and moist. No oropharyngeal exudate.  Eyes: Conjunctivae are normal. Right eye exhibits no discharge. Left eye exhibits no discharge.  Neck: Neck supple. No thyromegaly present.  Cardiovascular: Normal rate and regular rhythm.  Pulmonary/Chest: Breath sounds  normal. No respiratory distress. He has no wheezes.  Abdominal: Soft. Bowel sounds are normal. There is no tenderness.  Genitourinary:  Genitourinary Comments: Not performed.   Musculoskeletal: He exhibits no edema or tenderness.  Lymphadenopathy:    He has no cervical adenopathy.  Neurological: He is alert and oriented to person, place, and time.  Skin: No rash noted. No erythema.  Psychiatric: He has a normal mood and affect. His behavior is normal.    BP 134/86 (BP Location: Left Arm, Patient Position: Sitting, Cuff Size: Normal)   Pulse 64   Temp 97.8 F (36.6 C) (Oral)   Resp 18   Ht 5' 8" (1.727 m)   Wt 173 lb 9.6 oz (78.7 kg)   SpO2 99%   BMI 26.40 kg/m  Wt Readings from Last 3 Encounters:  06/23/18 173 lb 9.6 oz (78.7 kg)  10/14/17 174 lb 12.8 oz (79.3 kg)  09/21/17 177 lb 3.2 oz (80.4 kg)     Lab Results  Component Value Date   WBC 5.6 06/23/2018   HGB 15.2 06/23/2018   HCT 43.4 06/23/2018   PLT 236.0 06/23/2018   GLUCOSE 100 (H) 06/23/2018   CHOL 148 06/23/2018   TRIG 177.0 (H) 06/23/2018   HDL 31.70 (L) 06/23/2018   LDLDIRECT 99.1 07/29/2013   LDLCALC 80 06/23/2018   ALT 17 06/23/2018   AST 21 06/23/2018   NA 139 06/23/2018   K 4.3 06/23/2018   CL 104 06/23/2018   CREATININE 1.09 06/23/2018   BUN 15 06/23/2018   CO2 28 06/23/2018   TSH 1.31 06/23/2018   PSA 1.05 06/23/2018   HGBA1C 5.3 06/23/2018     Mr Brain W Wo Contrast  Result Date: 10/09/2017 CLINICAL DATA:  Anosmia. Ongoing symptoms since December after an illness. EXAM: MRI HEAD WITHOUT AND WITH CONTRAST TECHNIQUE: Multiplanar, multiecho pulse sequences of the brain and surrounding structures were obtained without and with intravenous contrast. CONTRAST:  15 mL MultiHance COMPARISON:  10/08/2011 FINDINGS: Brain: There is no evidence of acute infarct, intracranial hemorrhage, mass, midline shift, or extra-axial fluid collection. Scattered small foci of T2 hyperintensity in the deep cerebral white matter are stable to slightly progressive from 2013 and nonspecific but compatible with mild chronic small vessel ischemic disease. The ventricles and sulci are normal for age. No abnormal enhancement is identified. Dedicated thin section imaging through the anterior cranial fossa was performed, and the coronal sequence is motion degraded which limits detailed assessment of the olfactory bulbs/tracts. No mass or other gross abnormality is identified along the olfactory grooves. No focal insult is identified in the inferior frontal lobes. Vascular: Major intracranial vascular flow voids are preserved. Skull and upper cervical spine: Unremarkable bone marrow signal. Sinuses/Orbits: Bilateral cataract extraction. Opacification of a single posterior right ethmoid air cell with only minimal ethmoid air cell mucosal thickening elsewhere bilaterally. No sinus fluid level. Clear mastoid air cells. Other: None. IMPRESSION: 1. No intracranial mass lesion or other cause of anosmia identified. 2. Minimal ethmoid sinus inflammatory mucosal disease. 3. Mild chronic small vessel ischemic disease. Electronically Signed   By: Logan Bores M.D.   On: 10/09/2017 10:45       Assessment & Plan:   Problem List Items Addressed This Visit    CAD (coronary artery disease)    Followed by cardiology.  Stable.  Continue risk factor modifcation.        Environmental  allergies    Stable on current regimen.        Essential  hypertension, benign - Primary    Blood pressure under good control.  Continue same medication regimen.  Follow pressures.  Follow metabolic panel.        Relevant Orders   CBC with Differential/Platelet (Completed)   TSH (Completed)   Basic metabolic panel (Completed)   GERD (gastroesophageal reflux disease)    Controlled.       Health care maintenance    Physical today 06/23/18.  Colonoscopy 05/2017 - one small rectal polyp an diverticulosis.  Check psa.       Hypercholesterolemia    On simvastatin.  Low cholesterol diet and exercise.  Follow lipid panel and liver function tests.        Relevant Orders   Hepatic function panel (Completed)   Lipid panel (Completed)   Hyperglycemia    Low carb diet and exercise.  Follow met b and a1c.        Relevant Orders   Hemoglobin A1c (Completed)   Seizure disorder ([place])    Followed by neurology. On Keppra.  Stable.         Other Visit Diagnoses    Prostate cancer screening       Relevant Orders   PSA, Medicare (Completed)   Encounter for immunization       Relevant Orders   Flu vaccine HIGH DOSE PF (Completed)       Einar Pheasant, MD

## 2018-06-23 NOTE — Assessment & Plan Note (Addendum)
Physical today 06/23/18.  Colonoscopy 05/2017 - one small rectal polyp an diverticulosis.  Check psa.

## 2018-06-24 ENCOUNTER — Other Ambulatory Visit: Payer: Self-pay | Admitting: Internal Medicine

## 2018-06-24 NOTE — Progress Notes (Signed)
Order placed for f/u liver panel.  

## 2018-06-26 ENCOUNTER — Encounter: Payer: Self-pay | Admitting: Internal Medicine

## 2018-06-26 NOTE — Assessment & Plan Note (Signed)
Low carb diet and exercise.  Follow met b and a1c.   

## 2018-06-26 NOTE — Assessment & Plan Note (Signed)
Controlled.  

## 2018-06-26 NOTE — Assessment & Plan Note (Signed)
Followed by neurology. On Keppra.  Stable.

## 2018-06-26 NOTE — Assessment & Plan Note (Signed)
Blood pressure under good control.  Continue same medication regimen.  Follow pressures.  Follow metabolic panel.   

## 2018-06-26 NOTE — Assessment & Plan Note (Signed)
Followed by cardiology.  Stable.  Continue risk factor modifcation.

## 2018-06-26 NOTE — Assessment & Plan Note (Signed)
On simvastatin.  Low cholesterol diet and exercise.  Follow lipid panel and liver function tests.   

## 2018-06-26 NOTE — Assessment & Plan Note (Signed)
Stable on current regimen   

## 2018-06-29 DIAGNOSIS — M71341 Other bursal cyst, right hand: Secondary | ICD-10-CM | POA: Diagnosis not present

## 2018-06-29 DIAGNOSIS — L578 Other skin changes due to chronic exposure to nonionizing radiation: Secondary | ICD-10-CM | POA: Diagnosis not present

## 2018-06-29 DIAGNOSIS — L218 Other seborrheic dermatitis: Secondary | ICD-10-CM | POA: Diagnosis not present

## 2018-06-29 DIAGNOSIS — Z872 Personal history of diseases of the skin and subcutaneous tissue: Secondary | ICD-10-CM | POA: Diagnosis not present

## 2018-06-29 DIAGNOSIS — L570 Actinic keratosis: Secondary | ICD-10-CM | POA: Diagnosis not present

## 2018-06-29 DIAGNOSIS — Z1283 Encounter for screening for malignant neoplasm of skin: Secondary | ICD-10-CM | POA: Diagnosis not present

## 2018-07-22 ENCOUNTER — Other Ambulatory Visit (INDEPENDENT_AMBULATORY_CARE_PROVIDER_SITE_OTHER): Payer: Medicare HMO

## 2018-07-22 LAB — HEPATIC FUNCTION PANEL
ALT: 17 U/L (ref 0–53)
AST: 21 U/L (ref 0–37)
Albumin: 4.1 g/dL (ref 3.5–5.2)
Alkaline Phosphatase: 57 U/L (ref 39–117)
BILIRUBIN DIRECT: 0.1 mg/dL (ref 0.0–0.3)
BILIRUBIN TOTAL: 1 mg/dL (ref 0.2–1.2)
TOTAL PROTEIN: 7 g/dL (ref 6.0–8.3)

## 2018-07-23 ENCOUNTER — Encounter: Payer: Self-pay | Admitting: Internal Medicine

## 2018-07-24 IMAGING — MR MR HEAD WO/W CM
11 series · 39 of 48 positions shown · IV contrast (multihance)
Comparison: 10/08/2011

CLINICAL DATA: Anosmia. Ongoing symptoms since [REDACTED] after an
illness.

EXAM:
MRI HEAD WITHOUT AND WITH CONTRAST
TECHNIQUE: Multiplanar, multiecho pulse sequences of the brain and surrounding
structures were obtained without and with intravenous contrast.
CONTRAST:  15 mL MultiHance

[Series 2: T1 · sagittal · 5.0mm · 0.45mm/px · 3 of 23 slices shown (1 of 3)]
[im 1/23]
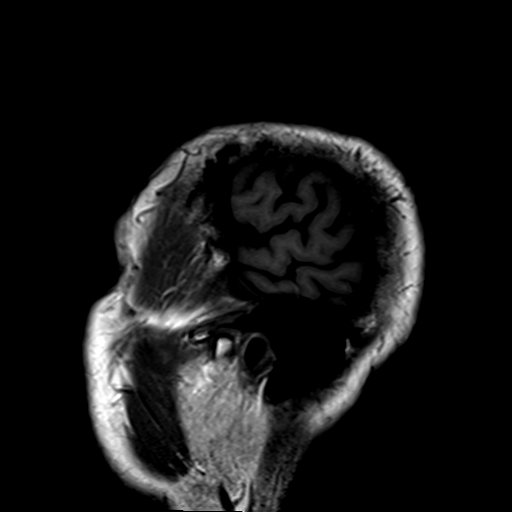
[im 12/23]
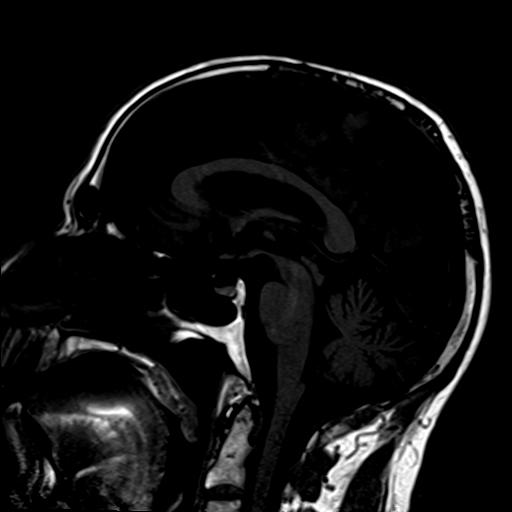
[im 23/23]
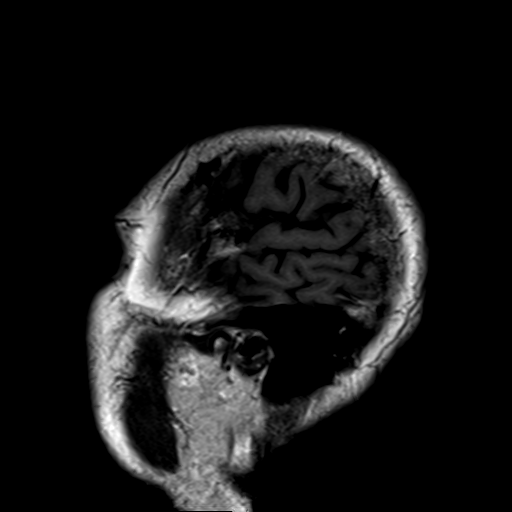

[Series 4: DWI · axial · 3.0mm · 1.80mm/px · z∈[-85,+70]mm · 3 of 40 slices shown]
[im 1/40]
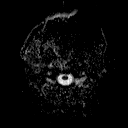
[im 20/40]
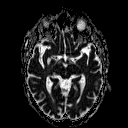
[im 40/40]
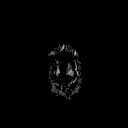

[Series 5: T2 · axial · 5.0mm · 0.60mm/px · z∈[-79,+66]mm · 2 of 24 slices shown (1 of 2)]
[im 1/24]
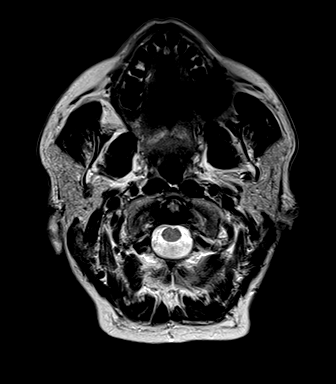
[im 24/24]
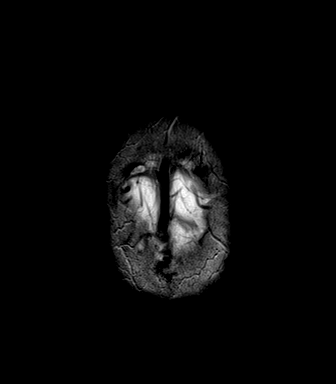

[Series 6: T2 · axial · 5.0mm · 0.45mm/px · z∈[-79,+66]mm · 2 of 24 slices shown (2 of 2)]
[im 1/24]
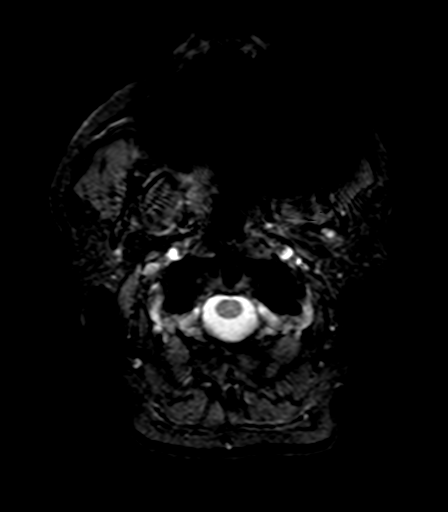
[im 24/24]
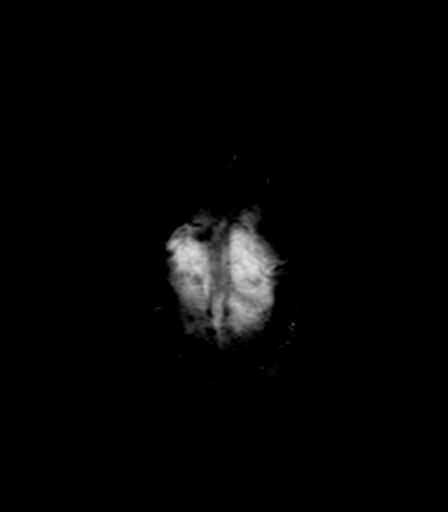

[Series 7: FLAIR · axial · 3.0mm · 0.45mm/px · z∈[-83,+68]mm · 4 of 53 slices shown]
[im 1/53]
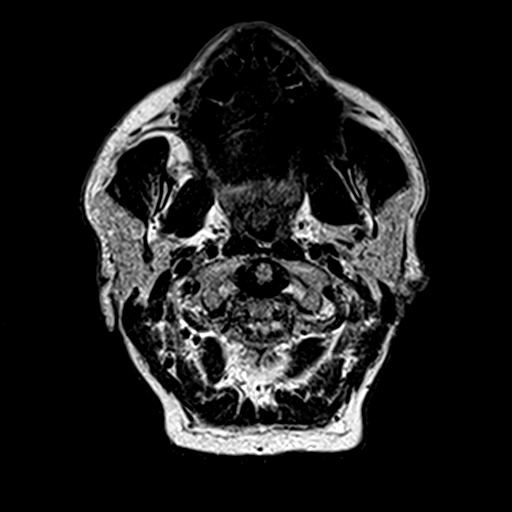
[im 18/53]
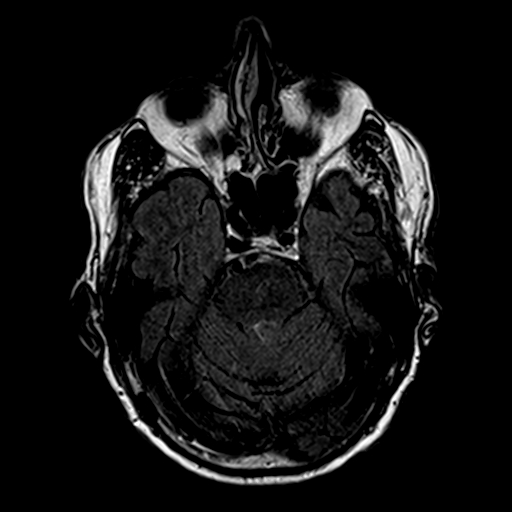
[im 35/53]
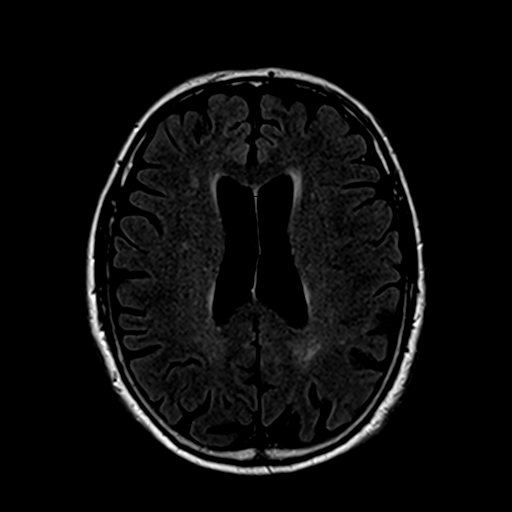
[im 53/53]
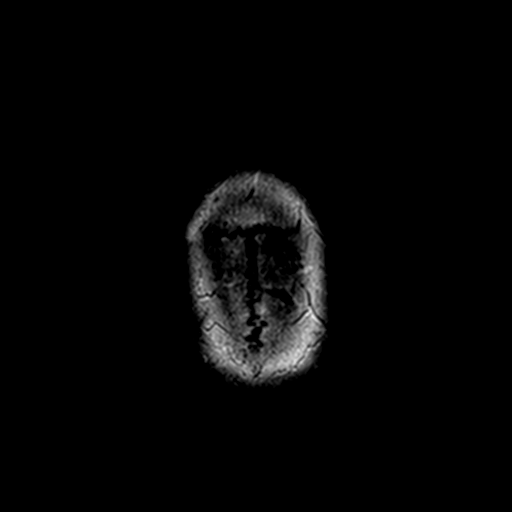

[Series 8: T1 · axial · 1.0mm · 1.00mm/px · z∈[-72,+66]mm · 8 of 144 slices shown (2 of 3)]
[im 1/144]
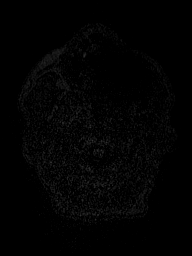
[im 27/144]
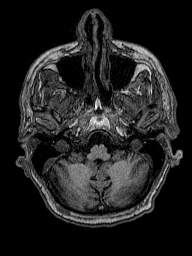
[im 40/144]
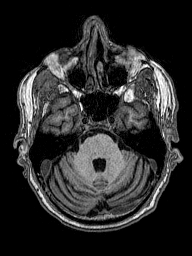
[im 66/144]
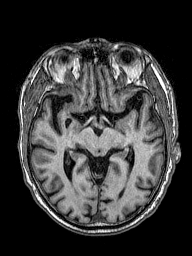
[im 79/144]
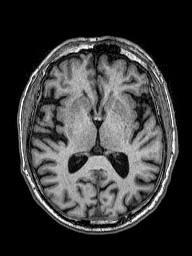
[im 105/144]
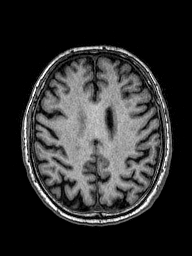
[im 118/144]
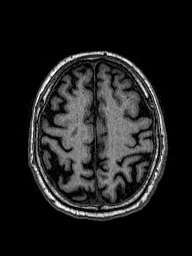
[im 144/144]
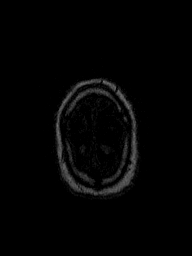

[Series 9: T2 fat-sat · coronal · 3.0mm · 0.45mm/px · 3 of 35 slices shown (1 of 2)]
[im 1/35]
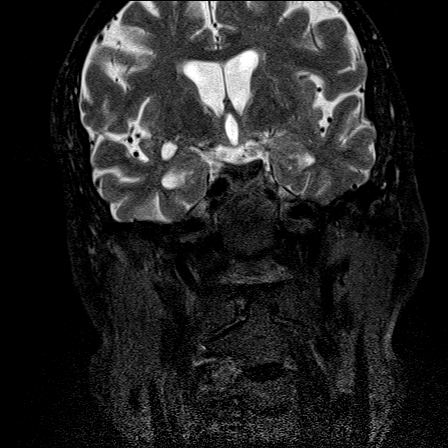
[im 18/35]
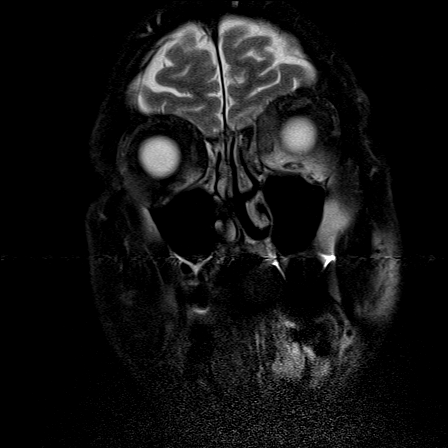
[im 35/35]
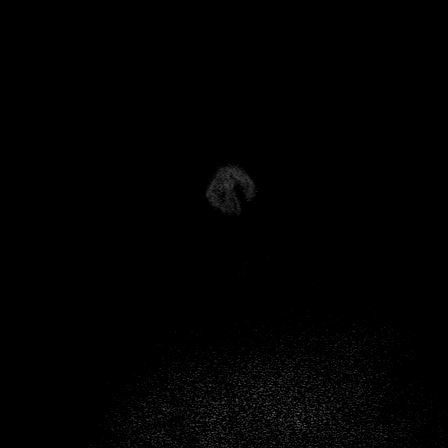

[Series 10: T2 fat-sat · axial · 3.0mm · 0.45mm/px · z∈[-73,+13]mm · 2 of 27 slices shown (2 of 2)]
[im 1/27]
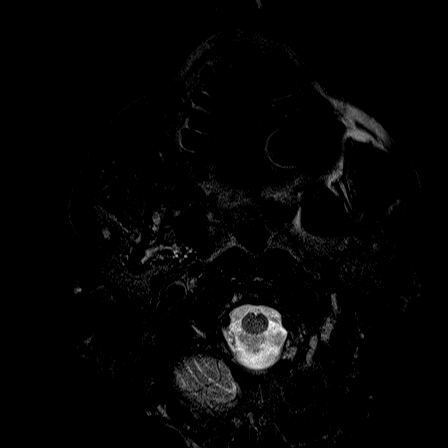
[im 27/27]
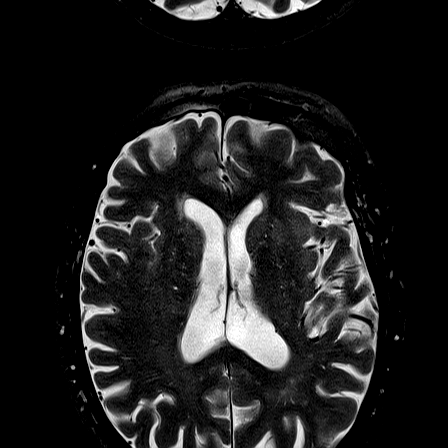

[Series 11: T1 · axial · 1.0mm · 1.00mm/px · z∈[-72,+66]mm · 8 of 144 slices shown (3 of 3)]
[im 1/144]
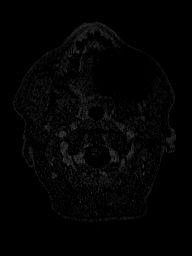
[im 27/144]
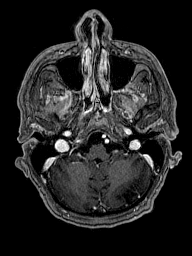
[im 40/144]
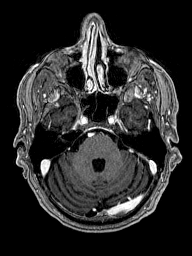
[im 66/144]
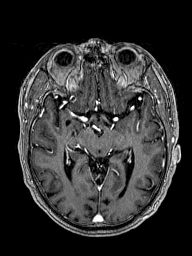
[im 79/144]
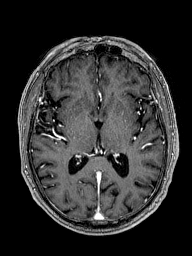
[im 105/144]
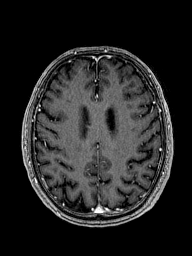
[im 118/144]
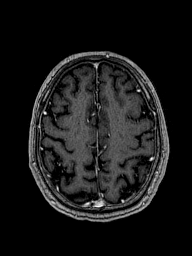
[im 144/144]
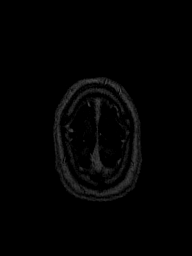

[Series 12: T1 post-contrast · coronal · 5.0mm · 0.43mm/px · 2 of 28 slices shown]
[im 1/28]
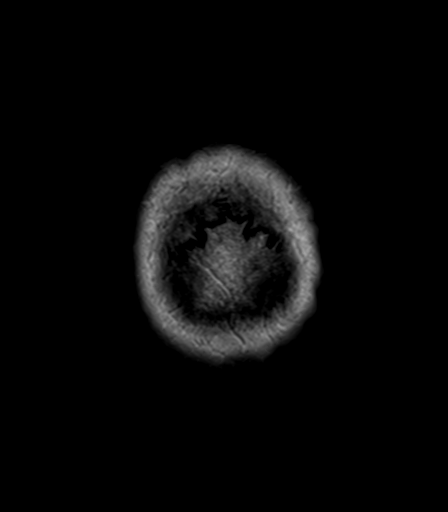
[im 28/28]
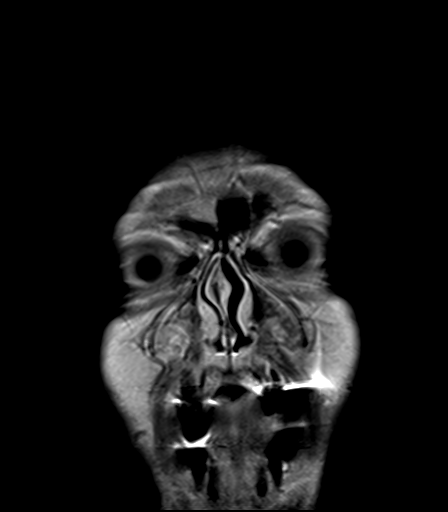

[Series 100: (id) ax · axial · 3.0mm · 1.80mm/px · z∈[-85,-10]mm · 2 of 42 slices shown]
[im 1/42]
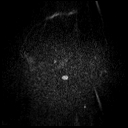
[im 21/42]
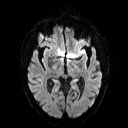

[39 of 48 positions shown; findings below may reference images not displayed]

FINDINGS: Brain: There is no evidence of acute infarct, intracranial
hemorrhage, mass, midline shift, or extra-axial fluid collection.
Scattered small foci of T2 hyperintensity in the deep cerebral white
matter are stable to slightly progressive from 9576 and nonspecific
but compatible with mild chronic small vessel ischemic disease. The
ventricles and sulci are normal for age. No abnormal enhancement is
identified.

Dedicated thin section imaging through the anterior cranial fossa
was performed, and the coronal sequence is motion degraded which
limits detailed assessment of the olfactory bulbs/tracts. No mass or
other gross abnormality is identified along the olfactory grooves.
No focal insult is identified in the inferior frontal lobes.

Vascular: Major intracranial vascular flow voids are preserved.

Skull and upper cervical spine: Unremarkable bone marrow signal.

Sinuses/Orbits: Bilateral cataract extraction. Opacification of a
single posterior right ethmoid air cell with only minimal ethmoid
air cell mucosal thickening elsewhere bilaterally. No sinus fluid
level. Clear mastoid air cells.

Other: None.
IMPRESSION: 1. No intracranial mass lesion or other cause of anosmia identified.
2. Minimal ethmoid sinus inflammatory mucosal disease.
3. Mild chronic small vessel ischemic disease.

## 2018-10-15 ENCOUNTER — Ambulatory Visit: Payer: Medicare HMO

## 2018-11-08 DIAGNOSIS — H40003 Preglaucoma, unspecified, bilateral: Secondary | ICD-10-CM | POA: Diagnosis not present

## 2018-12-29 ENCOUNTER — Ambulatory Visit: Payer: Medicare HMO | Admitting: Internal Medicine

## 2019-02-17 ENCOUNTER — Telehealth: Payer: Self-pay | Admitting: Internal Medicine

## 2019-02-17 DIAGNOSIS — R5383 Other fatigue: Secondary | ICD-10-CM

## 2019-02-17 DIAGNOSIS — E7800 Pure hypercholesterolemia, unspecified: Secondary | ICD-10-CM

## 2019-02-17 DIAGNOSIS — Z125 Encounter for screening for malignant neoplasm of prostate: Secondary | ICD-10-CM

## 2019-02-17 DIAGNOSIS — R739 Hyperglycemia, unspecified: Secondary | ICD-10-CM

## 2019-02-17 DIAGNOSIS — I10 Essential (primary) hypertension: Secondary | ICD-10-CM

## 2019-02-17 NOTE — Telephone Encounter (Signed)
Orders signed for pt.  Ok to schedule fasting lab.

## 2019-02-17 NOTE — Telephone Encounter (Signed)
Pt would like to have labs done before appt on 06/22. Please advise?  Call pt @ [phone] or cell [phone]. Thank you!

## 2019-02-18 NOTE — Telephone Encounter (Signed)
Patient scheduled for Monday 9:15

## 2019-02-21 ENCOUNTER — Other Ambulatory Visit: Payer: Self-pay

## 2019-02-21 ENCOUNTER — Other Ambulatory Visit (INDEPENDENT_AMBULATORY_CARE_PROVIDER_SITE_OTHER): Payer: Medicare HMO

## 2019-02-21 ENCOUNTER — Ambulatory Visit: Payer: Medicare HMO | Admitting: Internal Medicine

## 2019-02-21 DIAGNOSIS — R739 Hyperglycemia, unspecified: Secondary | ICD-10-CM | POA: Diagnosis not present

## 2019-02-21 DIAGNOSIS — I10 Essential (primary) hypertension: Secondary | ICD-10-CM | POA: Diagnosis not present

## 2019-02-21 DIAGNOSIS — E7800 Pure hypercholesterolemia, unspecified: Secondary | ICD-10-CM

## 2019-02-21 LAB — BASIC METABOLIC PANEL
BUN: 15 mg/dL (ref 6–23)
CO2: 27 mEq/L (ref 19–32)
Calcium: 9.2 mg/dL (ref 8.4–10.5)
Chloride: 104 mEq/L (ref 96–112)
Creatinine, Ser: 1.12 mg/dL (ref 0.40–1.50)
GFR: 63.46 mL/min (ref >60.00)
Glucose, Bld: 96 mg/dL (ref 70–99)
Potassium: 4.1 mEq/L (ref 3.5–5.1)
Sodium: 138 mEq/L (ref 135–145)

## 2019-02-21 LAB — LIPID PANEL
Cholesterol: 137 mg/dL (ref 0–200)
HDL: 32.4 mg/dL — ABNORMAL LOW (ref >39.00)
LDL Cholesterol: 80 mg/dL (ref 0–99)
NonHDL: 104.56
Total CHOL/HDL Ratio: 4
Triglycerides: 123 mg/dL (ref 0.0–149.0)
VLDL: 24.6 mg/dL (ref 0.0–40.0)

## 2019-02-21 LAB — HEPATIC FUNCTION PANEL
ALT: 16 U/L (ref 0–53)
AST: 19 U/L (ref 0–37)
Albumin: 4.1 g/dL (ref 3.5–5.2)
Alkaline Phosphatase: 57 U/L (ref 39–117)
Bilirubin, Direct: 0.1 mg/dL (ref 0.0–0.3)
Total Bilirubin: 0.6 mg/dL (ref 0.2–1.2)
Total Protein: 6.5 g/dL (ref 6.0–8.3)

## 2019-02-21 LAB — HEMOGLOBIN A1C: Hgb A1c MFr Bld: 5.5 % (ref 4.6–6.5)

## 2019-02-25 ENCOUNTER — Ambulatory Visit (INDEPENDENT_AMBULATORY_CARE_PROVIDER_SITE_OTHER): Payer: Medicare HMO | Admitting: Internal Medicine

## 2019-02-25 ENCOUNTER — Encounter: Payer: Self-pay | Admitting: Internal Medicine

## 2019-02-25 ENCOUNTER — Other Ambulatory Visit: Payer: Self-pay

## 2019-02-25 DIAGNOSIS — Z125 Encounter for screening for malignant neoplasm of prostate: Secondary | ICD-10-CM

## 2019-02-25 DIAGNOSIS — G40909 Epilepsy, unspecified, not intractable, without status epilepticus: Secondary | ICD-10-CM

## 2019-02-25 DIAGNOSIS — K219 Gastro-esophageal reflux disease without esophagitis: Secondary | ICD-10-CM | POA: Diagnosis not present

## 2019-02-25 DIAGNOSIS — Z9109 Other allergy status, other than to drugs and biological substances: Secondary | ICD-10-CM | POA: Diagnosis not present

## 2019-02-25 DIAGNOSIS — E7800 Pure hypercholesterolemia, unspecified: Secondary | ICD-10-CM

## 2019-02-25 DIAGNOSIS — R739 Hyperglycemia, unspecified: Secondary | ICD-10-CM

## 2019-02-25 DIAGNOSIS — I2510 Atherosclerotic heart disease of native coronary artery without angina pectoris: Secondary | ICD-10-CM

## 2019-02-25 NOTE — Progress Notes (Signed)
Patient ID: Edwin [name], male   DOB: 11/28/1940, 78 y.o.   MRN: [id number]   Virtual Visit via telephone Note  This visit type was conducted due to national recommendations for restrictions regarding the COVID-19 pandemic (e.g. social distancing).  This format is felt to be most appropriate for this patient at this time.  All issues noted in this document were discussed and addressed.  No physical exam was performed (except for noted visual exam findings with Video Visits).   I connected with [name] by telephone and verified that I am speaking with the correct person using two identifiers. Location patient: home Location provider: work Persons participating in the telephone visit: patient, provider  I discussed the limitations, risks, security and privacy concerns of performing an evaluation and management service by telephone and the availability of in person appointments. The patient expressed understanding and agreed to proceed.   Reason for visit: scheduled follow up.   HPI: He reports he is doing well.  Feels good.  No chest pain.  No sob.  Has f/u with Dr Ubaldo Glassing 03/2019.  No acid reflux.  No abdominal pain.  Bowels moving.  On keppra.  No seizures.  Blood pressure has been doing well.  Still with some allergy symptoms.  Taking allegra.  Helps.  Staying in due to covid restrictions.  No fever.  No chest congestion or cough.     ROS: See pertinent positives and negatives per HPI.  Past Medical History:  Diagnosis Date  . Allergy   . Arthritis   . Coronary artery disease   . Diverticulosis of colon   . GERD (gastroesophageal reflux disease)    ESOPHAGEAL REFLUX  . Glaucoma   . Gout   . Hemorrhoids   . History of kidney stones   . Hx of colonic polyp   . Hypercholesterolemia   . Hypertension   . Schatzki's ring   . Seizures ([place])     Past Surgical History:  Procedure Laterality Date  . CARDIAC CATHETERIZATION    . COLONOSCOPY WITH PROPOFOL N/A 05/18/2017   Procedure: COLONOSCOPY WITH PROPOFOL;  Surgeon: Manya Silvas, MD;  Location[facility] ENDOSCOPY;  Service: Endoscopy;  Laterality: N/A;  . CORONARY ANGIOPLASTY    . EYE SURGERY  07/20/13   cataract  . HERNIA REPAIR Right 2008 & 2012  . VASECTOMY  1984    Family History  Problem Relation Age of Onset  . Arthritis Mother   . Heart disease Mother   . Stroke Mother   . Hypertension Mother   . Arthritis Father   . Hyperlipidemia Father   . Heart disease Father   . Hypertension Father   . Parkinson's disease Father   . Multiple sclerosis Sister   . Colon cancer Maternal Grandmother   . Arthritis Paternal Grandmother   . Colon cancer Paternal Grandmother     SOCIAL HX: reviewed.    Current Outpatient Medications:  .  Alpha-Lipoic Acid 200 MG CAPS, Take by mouth., Disp: , Rfl:  .  aspirin 81 MG tablet, Take 81 mg by mouth at bedtime., Disp: , Rfl:  .  azelastine (ASTELIN) 0.1 % nasal spray, Place 1 spray into both nostrils 2 (two) times daily., Disp: 90 mL, Rfl: 1 .  Cholecalciferol (VITAMIN D3) 2000 UNITS TABS, Take by mouth every morning., Disp: , Rfl:  .  ciclopirox (PENLAC) 8 % solution, Apply topically at bedtime. Apply over nail and surrounding skin. Apply daily over previous coat. After seven (7) days,  may remove with alcohol and continue cycle., Disp: 6.6 mL, Rfl: 0 .  clobetasol cream (TEMOVATE) 5.05 %, Apply 1 application topically 2 (two) times daily. Do not use on face or genital area.  Do not use for more 7-10 days in the same place., Disp: 15 g, Rfl: 0 .  clopidogrel (PLAVIX) 75 MG tablet, Take 75 mg by mouth daily., Disp: , Rfl:  .  colchicine 0.6 MG tablet, Take 0.6 mg by mouth daily as needed., Disp: , Rfl:  .  latanoprost (XALATAN) 0.005 % ophthalmic solution, Place 1 drop into both eyes at bedtime., Disp: , Rfl:  .  levETIRAcetam (KEPPRA) 500 MG tablet, Take 500 mg by mouth daily. , Disp: , Rfl:  .  loratadine (CLARITIN) 10 MG tablet, Take 10 mg by mouth daily.,  Disp: , Rfl:  .  Magnesium 400 MG CAPS, Take by mouth at bedtime., Disp: , Rfl:  .  Misc Natural Products (TART CHERRY ADVANCED PO), Take 500 mg by mouth every morning., Disp: , Rfl:  .  Multiple Vitamin (MULTIVITAMIN) tablet, Take 1 tablet by mouth daily., Disp: , Rfl:  .  Omega-3 Fatty Acids (FISH OIL) 1200 MG CAPS, Take by mouth 2 (two) times daily., Disp: , Rfl:  .  omeprazole (PRILOSEC) 20 MG capsule, TAKE 1 CAPSULE TWICE DAILY BEFORE MEALS, Disp: 180 capsule, Rfl: 6 .  polyethylene glycol (MIRALAX / GLYCOLAX) packet, Take 17 g by mouth at bedtime., Disp: , Rfl:  .  Psyllium (METAMUCIL PO), Take by mouth every morning., Disp: , Rfl:  .  Saw Palmetto 450 MG CAPS, Take by mouth every morning., Disp: , Rfl:  .  simvastatin (ZOCOR) 40 MG tablet, Take 40 mg by mouth daily., Disp: , Rfl:   EXAM:  VITALS:  126/72  GENERAL: alert.  Sounds to be in no acute distress.  Answering questions appropriately.    PSYCH/NEURO: pleasant and cooperative, no obvious depression or anxiety, speech and thought processing grossly intact  ASSESSMENT AND PLAN:  Discussed the following assessment and plan:  CAD (coronary artery disease) Followed by cardiology. Stable.  Continue risk factor modification.    Environmental allergies On allegra.  Stable.    GERD (gastroesophageal reflux disease) Controlled.    Hypercholesterolemia On simvastatin.  Low cholesterol diet and exercise. Follow lipid panel and liver function tests.    Hyperglycemia Low carb diet and exercise.  Follow met b and a1c.    Seizure disorder Followed by neurology.  On keppra.  Stable.  No seizures.      I discussed the assessment and treatment plan with the patient. The patient was provided an opportunity to ask questions and all were answered. The patient agreed with the plan and demonstrated an understanding of the instructions.   The patient was advised to call back or seek an in-person evaluation if the symptoms worsen or if  the condition fails to improve as anticipated.  I provided 17 minutes of non-face-to-face time during this encounter.   Einar Pheasant, MD

## 2019-02-27 ENCOUNTER — Encounter: Payer: Self-pay | Admitting: Internal Medicine

## 2019-02-27 NOTE — Assessment & Plan Note (Signed)
Controlled.  

## 2019-02-27 NOTE — Assessment & Plan Note (Signed)
On simvastatin.  Low cholesterol diet and exercise.  Follow lipid panel and liver function tests.   

## 2019-02-27 NOTE — Assessment & Plan Note (Signed)
Followed by neurology.  On keppra.  Stable.  No seizures.

## 2019-02-27 NOTE — Assessment & Plan Note (Signed)
On allegra.  Stable.  ?

## 2019-02-27 NOTE — Assessment & Plan Note (Signed)
Low carb diet and exercise.  Follow met b and a1c.   

## 2019-02-27 NOTE — Assessment & Plan Note (Signed)
Followed by cardiology.  Stable.  Continue risk factor modification.   

## 2019-03-03 DIAGNOSIS — R0789 Other chest pain: Secondary | ICD-10-CM | POA: Diagnosis not present

## 2019-03-03 DIAGNOSIS — I10 Essential (primary) hypertension: Secondary | ICD-10-CM | POA: Diagnosis not present

## 2019-03-03 DIAGNOSIS — I2510 Atherosclerotic heart disease of native coronary artery without angina pectoris: Secondary | ICD-10-CM | POA: Diagnosis not present

## 2019-03-03 DIAGNOSIS — Z9861 Coronary angioplasty status: Secondary | ICD-10-CM | POA: Diagnosis not present

## 2019-03-03 DIAGNOSIS — E7800 Pure hypercholesterolemia, unspecified: Secondary | ICD-10-CM | POA: Diagnosis not present

## 2019-03-16 ENCOUNTER — Other Ambulatory Visit: Payer: Self-pay | Admitting: Internal Medicine

## 2019-05-04 DIAGNOSIS — H40053 Ocular hypertension, bilateral: Secondary | ICD-10-CM | POA: Diagnosis not present

## 2019-05-10 DIAGNOSIS — H40003 Preglaucoma, unspecified, bilateral: Secondary | ICD-10-CM | POA: Diagnosis not present

## 2019-06-21 NOTE — Telephone Encounter (Signed)
Error

## 2019-06-29 ENCOUNTER — Ambulatory Visit (INDEPENDENT_AMBULATORY_CARE_PROVIDER_SITE_OTHER): Payer: Medicare HMO

## 2019-06-29 ENCOUNTER — Other Ambulatory Visit: Payer: Self-pay

## 2019-06-29 DIAGNOSIS — Z23 Encounter for immunization: Secondary | ICD-10-CM | POA: Diagnosis not present

## 2019-06-30 DIAGNOSIS — Z872 Personal history of diseases of the skin and subcutaneous tissue: Secondary | ICD-10-CM | POA: Diagnosis not present

## 2019-06-30 DIAGNOSIS — L570 Actinic keratosis: Secondary | ICD-10-CM | POA: Diagnosis not present

## 2019-06-30 DIAGNOSIS — M71341 Other bursal cyst, right hand: Secondary | ICD-10-CM | POA: Diagnosis not present

## 2019-06-30 DIAGNOSIS — D485 Neoplasm of uncertain behavior of skin: Secondary | ICD-10-CM | POA: Diagnosis not present

## 2019-06-30 DIAGNOSIS — L578 Other skin changes due to chronic exposure to nonionizing radiation: Secondary | ICD-10-CM | POA: Diagnosis not present

## 2019-06-30 DIAGNOSIS — D044 Carcinoma in situ of skin of scalp and neck: Secondary | ICD-10-CM | POA: Diagnosis not present

## 2019-07-16 ENCOUNTER — Emergency Department: Payer: Medicare HMO

## 2019-07-16 ENCOUNTER — Emergency Department
Admission: EM | Admit: 2019-07-16 | Discharge: 2019-07-16 | Disposition: A | Discharge status: Home / Self Care | Payer: Medicare HMO | Source: Home / Self Care | Attending: Emergency Medicine | Admitting: Emergency Medicine

## 2019-07-16 ENCOUNTER — Other Ambulatory Visit: Payer: Self-pay

## 2019-07-16 DIAGNOSIS — K449 Diaphragmatic hernia without obstruction or gangrene: Secondary | ICD-10-CM | POA: Diagnosis present

## 2019-07-16 DIAGNOSIS — Z880 Allergy status to penicillin: Secondary | ICD-10-CM | POA: Diagnosis not present

## 2019-07-16 DIAGNOSIS — R109 Unspecified abdominal pain: Secondary | ICD-10-CM | POA: Diagnosis not present

## 2019-07-16 DIAGNOSIS — Z820 Family history of epilepsy and other diseases of the nervous system: Secondary | ICD-10-CM | POA: Diagnosis not present

## 2019-07-16 DIAGNOSIS — I700 Atherosclerosis of aorta: Secondary | ICD-10-CM | POA: Diagnosis present

## 2019-07-16 DIAGNOSIS — Z9861 Coronary angioplasty status: Secondary | ICD-10-CM | POA: Diagnosis not present

## 2019-07-16 DIAGNOSIS — Z79899 Other long term (current) drug therapy: Secondary | ICD-10-CM | POA: Insufficient documentation

## 2019-07-16 DIAGNOSIS — Z8261 Family history of arthritis: Secondary | ICD-10-CM | POA: Diagnosis not present

## 2019-07-16 DIAGNOSIS — K82A1 Gangrene of gallbladder in cholecystitis: Secondary | ICD-10-CM | POA: Diagnosis present

## 2019-07-16 DIAGNOSIS — Z823 Family history of stroke: Secondary | ICD-10-CM | POA: Diagnosis not present

## 2019-07-16 DIAGNOSIS — K4020 Bilateral inguinal hernia, without obstruction or gangrene, not specified as recurrent: Secondary | ICD-10-CM | POA: Diagnosis present

## 2019-07-16 DIAGNOSIS — K5730 Diverticulosis of large intestine without perforation or abscess without bleeding: Secondary | ICD-10-CM | POA: Diagnosis present

## 2019-07-16 DIAGNOSIS — I10 Essential (primary) hypertension: Secondary | ICD-10-CM | POA: Diagnosis present

## 2019-07-16 DIAGNOSIS — K828 Other specified diseases of gallbladder: Secondary | ICD-10-CM | POA: Diagnosis present

## 2019-07-16 DIAGNOSIS — Z7902 Long term (current) use of antithrombotics/antiplatelets: Secondary | ICD-10-CM | POA: Diagnosis not present

## 2019-07-16 DIAGNOSIS — R1010 Upper abdominal pain, unspecified: Secondary | ICD-10-CM

## 2019-07-16 DIAGNOSIS — K219 Gastro-esophageal reflux disease without esophagitis: Secondary | ICD-10-CM | POA: Diagnosis present

## 2019-07-16 DIAGNOSIS — Z800 Family history of malignant neoplasm of digestive organs: Secondary | ICD-10-CM | POA: Diagnosis not present

## 2019-07-16 DIAGNOSIS — R0602 Shortness of breath: Secondary | ICD-10-CM | POA: Diagnosis not present

## 2019-07-16 DIAGNOSIS — M109 Gout, unspecified: Secondary | ICD-10-CM | POA: Diagnosis present

## 2019-07-16 DIAGNOSIS — K810 Acute cholecystitis: Secondary | ICD-10-CM | POA: Diagnosis present

## 2019-07-16 DIAGNOSIS — Z8349 Family history of other endocrine, nutritional and metabolic diseases: Secondary | ICD-10-CM | POA: Diagnosis not present

## 2019-07-16 DIAGNOSIS — Z7982 Long term (current) use of aspirin: Secondary | ICD-10-CM | POA: Diagnosis not present

## 2019-07-16 DIAGNOSIS — E7800 Pure hypercholesterolemia, unspecified: Secondary | ICD-10-CM | POA: Diagnosis not present

## 2019-07-16 DIAGNOSIS — R1011 Right upper quadrant pain: Secondary | ICD-10-CM | POA: Diagnosis not present

## 2019-07-16 DIAGNOSIS — Z8249 Family history of ischemic heart disease and other diseases of the circulatory system: Secondary | ICD-10-CM | POA: Diagnosis not present

## 2019-07-16 DIAGNOSIS — I2510 Atherosclerotic heart disease of native coronary artery without angina pectoris: Secondary | ICD-10-CM | POA: Diagnosis present

## 2019-07-16 DIAGNOSIS — Z20828 Contact with and (suspected) exposure to other viral communicable diseases: Secondary | ICD-10-CM | POA: Diagnosis present

## 2019-07-16 DIAGNOSIS — K5790 Diverticulosis of intestine, part unspecified, without perforation or abscess without bleeding: Secondary | ICD-10-CM | POA: Diagnosis present

## 2019-07-16 DIAGNOSIS — N200 Calculus of kidney: Secondary | ICD-10-CM | POA: Diagnosis present

## 2019-07-16 DIAGNOSIS — K819 Cholecystitis, unspecified: Secondary | ICD-10-CM | POA: Diagnosis not present

## 2019-07-16 LAB — COMPREHENSIVE METABOLIC PANEL
ALT: 22 U/L (ref 0–44)
AST: 26 U/L (ref 15–41)
Albumin: 4.5 g/dL (ref 3.5–5.0)
Alkaline Phosphatase: 57 U/L (ref 38–126)
Anion gap: 10 (ref 5–15)
BUN: 17 mg/dL (ref 8–23)
CO2: 24 mmol/L (ref 22–32)
Calcium: 9.2 mg/dL (ref 8.9–10.3)
Chloride: 107 mmol/L (ref 98–111)
Creatinine, Ser: 1.07 mg/dL (ref 0.61–1.24)
GFR calc Af Amer: >60 mL/min (ref >60)
GFR calc non Af Amer: >60 mL/min (ref >60)
Glucose, Bld: 146 mg/dL — ABNORMAL HIGH (ref 70–99)
Potassium: 3.6 mmol/L (ref 3.5–5.1)
Sodium: 141 mmol/L (ref 135–145)
Total Bilirubin: 1.2 mg/dL (ref 0.3–1.2)
Total Protein: 8 g/dL (ref 6.5–8.1)

## 2019-07-16 LAB — CBC
HCT: 44 % (ref 39.0–52.0)
Hemoglobin: 14.9 g/dL (ref 13.0–17.0)
MCH: 28.8 pg (ref 26.0–34.0)
MCHC: 33.9 g/dL (ref 30.0–36.0)
MCV: 84.9 fL (ref 80.0–100.0)
Platelets: 245 10*3/uL (ref 150–400)
RBC: 5.18 MIL/uL (ref 4.22–5.81)
RDW: 12.6 % (ref 11.5–15.5)
WBC: 9.7 10*3/uL (ref 4.0–10.5)
nRBC: 0 % (ref 0.0–0.2)

## 2019-07-16 LAB — LIPASE, BLOOD: Lipase: 30 U/L (ref 11–51)

## 2019-07-16 MED ORDER — FAMOTIDINE 20 MG PO TABS: 20.0000 mg | ORAL_TABLET | Freq: Two times a day (BID) | ORAL | 0 refills | Status: DC

## 2019-07-16 MED ORDER — SUCRALFATE 1 G PO TABS: 1.0000 g | ORAL_TABLET | Freq: Four times a day (QID) | ORAL | 1 refills | Status: DC

## 2019-07-16 NOTE — ED Triage Notes (Signed)
Pt arrives to ED via POV from home with c/o intermittent abdominal pain x1 week. Pt reports s/x's come and go, "but tonight was a little more intense". Pt reports centralized abdominal pain without radiation; no back pain or urinary c/o's. Pt reports 2-3 episodes of nausea, but no vomiting or diarrhea. No c/o CP, SHOB, or fever. Pt reports 2 previous hernia surgeries, but still has all abdominal organs. Pt is A&O, in NAD; RR even, regular, and unlabored.

## 2019-07-16 NOTE — ED Provider Notes (Signed)
[facility] Emergency Department Provider Note  ____________________________________________  Time seen: Approximately 3:00 AM  I have reviewed the triage vital signs and the nursing notes.   HISTORY  Chief Complaint Abdominal Pain    HPI Edwin [name] is a 78 y.o. male with a history of hypertension, hemorrhoids, gout, diverticulosis who comes the ED complaining of intermittent right upper quadrant abdominal pain that for the past week.  No aggravating or alleviating factors, nonradiating, seems to resolve on its own.  No associated vomiting diarrhea or constipation.  No fevers chills chest pain or shortness of breath.  Reports that prior to this week he had had 2 episodes of similar symptoms over the past 6 months.   Denies postprandial symptoms.    Past Medical History:  Diagnosis Date  . Allergy   . Arthritis   . Coronary artery disease   . Diverticulosis of colon   . GERD (gastroesophageal reflux disease)    ESOPHAGEAL REFLUX  . Glaucoma   . Gout   . Hemorrhoids   . History of kidney stones   . Hx of colonic polyp   . Hypercholesterolemia   . Hypertension   . Schatzki's ring   . Seizures [facility])      Patient Active Problem List   Diagnosis Date Noted  . Hyperglycemia 09/21/2017  . Decreased taste and smell 09/21/2017  . Health care maintenance 11/12/2014  . Fatigue 04/09/2014  . CAD (coronary artery disease) 12/15/2013  . Arthritis 07/31/2013  . Glaucoma 07/31/2013  . Environmental allergies 07/31/2013  . Hypercholesterolemia 07/31/2013  . Essential hypertension, benign 07/31/2013  . Kidney stones 07/31/2013  . History of colonic polyps 07/31/2013  . Seizure disorder ([place]) 07/31/2013  . GERD (gastroesophageal reflux disease) 07/31/2013  . Gout 07/31/2013     Past Surgical History:  Procedure Laterality Date  . CARDIAC CATHETERIZATION    . COLONOSCOPY WITH PROPOFOL N/A 05/18/2017   Procedure: COLONOSCOPY WITH PROPOFOL;   Surgeon: Manya Silvas, MD;  Location: [facility] ENDOSCOPY;  Service: Endoscopy;  Laterality: N/A;  . CORONARY ANGIOPLASTY    . EYE SURGERY  07/20/13   cataract  . HERNIA REPAIR Right 2008 & 2012  . VASECTOMY  1984     Prior to Admission medications   Medication Sig Start Date End Date Taking? Authorizing Provider  Alpha-Lipoic Acid 200 MG CAPS Take by mouth.    [provider]  aspirin 81 MG tablet Take 81 mg by mouth at bedtime.    [provider]  azelastine (ASTELIN) 0.1 % nasal spray Place 1 spray into both nostrils 2 (two) times daily. 03/19/17   Einar Pheasant, MD  Cholecalciferol (VITAMIN D3) 2000 UNITS TABS Take by mouth every morning.    [provider]  ciclopirox (PENLAC) 8 % solution Apply topically at bedtime. Apply over nail and surrounding skin. Apply daily over previous coat. After seven (7) days, may remove with alcohol and continue cycle. 11/12/14   Einar Pheasant, MD  clobetasol cream (TEMOVATE) [id number] % Apply 1 application topically 2 (two) times daily. Do not use on face or genital area.  Do not use for more 7-10 days in the same place. 07/29/13   Einar Pheasant, MD  clopidogrel (PLAVIX) 75 MG tablet Take 75 mg by mouth daily.    [provider]  colchicine 0.6 MG tablet Take 0.6 mg by mouth daily as needed.    [provider]  famotidine (PEPCID) 20 MG tablet Take 1 tablet (20 mg total)  by mouth 2 (two) times daily for 7 days. 07/16/19 07/23/19  Carrie Mew, MD  latanoprost (XALATAN) 0.005 % ophthalmic solution Place 1 drop into both eyes at bedtime.    [provider]  levETIRAcetam (KEPPRA) 500 MG tablet Take 500 mg by mouth daily.     [provider]  loratadine (CLARITIN) 10 MG tablet Take 10 mg by mouth daily.    [provider]  Magnesium 400 MG CAPS Take by mouth at bedtime.    [provider]  Misc Natural Products (TART CHERRY ADVANCED PO) Take 500 mg by mouth every morning.     [provider]  Multiple Vitamin (MULTIVITAMIN) tablet Take 1 tablet by mouth daily.    [provider]  Omega-3 Fatty Acids (FISH OIL) 1200 MG CAPS Take by mouth 2 (two) times daily.    [provider]  omeprazole (PRILOSEC) 20 MG capsule TAKE 1 CAPSULE TWICE DAILY BEFORE MEALS 03/17/19   Einar Pheasant, MD  polyethylene glycol (MIRALAX / GLYCOLAX) packet Take 17 g by mouth at bedtime.    [provider]  Psyllium (METAMUCIL PO) Take by mouth every morning.    [provider]  Saw Palmetto 450 MG CAPS Take by mouth every morning.    [provider]  simvastatin (ZOCOR) 40 MG tablet Take 40 mg by mouth daily.    [provider]  sucralfate (CARAFATE) 1 g tablet Take 1 tablet (1 g total) by mouth 4 (four) times daily. 07/16/19   Carrie Mew, MD     Allergies Penicillins and Sulfa antibiotics   Family History  Problem Relation Age of Onset  . Arthritis Mother   . Heart disease Mother   . Stroke Mother   . Hypertension Mother   . Arthritis Father   . Hyperlipidemia Father   . Heart disease Father   . Hypertension Father   . Parkinson's disease Father   . Multiple sclerosis Sister   . Colon cancer Maternal Grandmother   . Arthritis Paternal Grandmother   . Colon cancer Paternal Grandmother     Social History Social History   Tobacco Use  . Smoking status: Never Smoker  . Smokeless tobacco: Never Used  Substance Use Topics  . Alcohol use: No    Alcohol/week: 0.0 standard drinks  . Drug use: No    Review of Systems  Constitutional:   No fever or chills.  ENT:   No sore throat. No rhinorrhea. Cardiovascular:   No chest pain or syncope. Respiratory:   No dyspnea or cough. Gastrointestinal: Positive as above for upper abdominal pain without vomiting and diarrhea.  Musculoskeletal:   Negative for focal pain or swelling All other systems reviewed and are negative except as documented above in ROS and  HPI.  ____________________________________________   PHYSICAL EXAM:  VITAL SIGNS: ED Triage Vitals  Enc Vitals Group     BP 07/16/19 0016 (!) 180/83     Pulse Rate 07/16/19 0014 68     Resp 07/16/19 0014 18     Temp 07/16/19 0014 98 F (36.7 C)     Temp Source 07/16/19 0014 Oral     SpO2 07/16/19 0014 98 %     Weight 07/16/19 0021 167 lb (75.8 kg)     Height 07/16/19 0021 5\' 9"  (1.753 m)     Head Circumference --      Peak Flow --      Pain Score 07/16/19 0021 4     Pain Loc --  Pain Edu? --      Excl. in [place]? --     Vital signs reviewed, nursing assessments reviewed.   Constitutional:   Alert and oriented. Non-toxic appearance. Eyes:   Conjunctivae are normal. EOMI. PERRL. ENT      Head:   Normocephalic and atraumatic.      Nose:   Wearing a mask.      Mouth/Throat:   Wearing a mask.      Neck:   No meningismus. Full ROM. Hematological/Lymphatic/Immunilogical:   No cervical lymphadenopathy. Cardiovascular:   RRR. Symmetric bilateral radial and DP pulses.  No murmurs. Cap refill less than 2 seconds. Respiratory:   Normal respiratory effort without tachypnea/retractions. Breath sounds are clear and equal bilaterally. No wheezes/rales/rhonchi. Gastrointestinal:   Soft with mild right upper quadrant tenderness.  Non distended. There is no CVA tenderness.  No rebound, rigidity, or guarding.  Musculoskeletal:   Normal range of motion in all extremities. No joint effusions.  No lower extremity tenderness.  No edema. Neurologic:   Normal speech and language.  Motor grossly intact. No acute focal neurologic deficits are appreciated.  Skin:    Skin is warm, dry and intact. No rash noted.  No petechiae, purpura, or bullae.  ____________________________________________    LABS (pertinent positives/negatives) (all labs ordered are listed, but only abnormal results are displayed) Labs Reviewed  COMPREHENSIVE METABOLIC PANEL - Abnormal; Notable for the following  components:      Result Value   Glucose, Bld 146 (*)    All other components within normal limits  LIPASE, BLOOD  CBC  URINALYSIS, COMPLETE (UACMP) WITH MICROSCOPIC   ____________________________________________   EKG    ____________________________________________    RADIOLOGY  US Abdomen Complete  Result Date: 07/16/2019 CLINICAL DATA:  Right upper quadrant and abdominal pain. EXAM: ABDOMEN ULTRASOUND COMPLETE COMPARISON:  None. FINDINGS: Gallbladder: Gallbladder wall thickening measuring up to 5.1 mm noted. No gallstones, pericholecystic fluid or sonographic Murphy's sign. Common bile duct: Diameter: 4.2 mm Liver: No focal lesion identified. Within normal limits in parenchymal echogenicity. Portal vein is patent on color Doppler imaging with normal direction of blood flow towards the liver. IVC: Not well visualized Pancreas: Not well visualized due to overlying bowel gas Spleen: Size and appearance within normal limits. Right Kidney: Length: 11.0 cm. Echogenicity within normal limits. No mass or hydronephrosis visualized. Upper pole, exophytic cyst measures 1.9 x 1.7 x 1.8 cm. Nonobstructing stone within the inferior pole measures 2.3 mm. Left Kidney: Length: 10.7 cm. Echogenicity within normal limits. No mass or hydronephrosis visualized. Abdominal aorta: No aneurysm visualized. Other findings: None. IMPRESSION: Diffuse gallbladder wall thickening. No gallstones, pericholecystic fluid or sonographic Murphy's sign. Cannot rule out acalculous cholecystitis. If further imaging is clinically indicated hepatic biliary nuclear medicine scan may be helpful to assess patency of the cystic duct. Nonobstructing right renal calculi. Electronically Signed   By: Kerby Moors M.D.   On: 07/16/2019 05:27    ____________________________________________   PROCEDURES Procedures  ____________________________________________  DIFFERENTIAL DIAGNOSIS   Cholecystitis, biliary colic,  gastritis  CLINICAL IMPRESSION / ASSESSMENT AND PLAN / ED COURSE  Medications ordered in the ED: Medications - No data to display  Pertinent labs & imaging results that were available during my care of the patient were reviewed by me and considered in my medical decision making (see chart for details).  SLEVIN SAURER was evaluated in Emergency Department on 07/16/2019 for the symptoms described in the history of present illness. He was evaluated in the  context of the global COVID-19 pandemic, which necessitated consideration that the patient might be at risk for infection with the SARS-CoV-2 virus that causes COVID-19. Institutional protocols and algorithms that pertain to the evaluation of patients at risk for COVID-19 are in a state of rapid change based on information released by regulatory bodies including the CDC and federal and state organizations. These policies and algorithms were followed during the patient's care in the ED.   Patient presents with colicky right upper quadrant pain most concerning for biliary disease.Considering the patient's symptoms, medical history, and physical examination today, I have low suspicion for pancreatitis, perforation or bowel obstruction, hernia, intra-abdominal abscess, AAA or dissection, volvulus or intussusception, mesenteric ischemia, or appendicitis.  Labs are unremarkable, rule out choledocholithiasis or pancreatitis given his presentation.  No prior abdominal imaging recently in the electronic medical record.  I will obtain a complete ultrasound exam of the abdomen.  Clinical Course as of Jul 15 613  Sat Jul 16, 2019  [id number] Patient reports he feels better.  Repeat abdominal exam is nontender, benign.  I will have him take Carafate and Pepcid for a week as a trial and follow-up with his primary care doctor.  Currently not consistent with acalculus cholecystitis, cholangitis, or biliary obstruction.   [PS]    Clinical Course User Index [PS]  Carrie Mew, MD    ____________________________________________   FINAL CLINICAL IMPRESSION(S) / ED DIAGNOSES    Final diagnoses:  Pain of upper abdomen     ED Discharge Orders         Ordered    sucralfate (CARAFATE) 1 g tablet  4 times daily     07/16/19 0614    famotidine (PEPCID) 20 MG tablet  2 times daily     07/16/19 [id number]          Portions of this note were generated with dragon dictation software. Dictation errors may occur despite best attempts at proofreading.   Carrie Mew, MD 07/16/19 [phone]

## 2019-07-17 ENCOUNTER — Encounter: Payer: Self-pay | Admitting: Internal Medicine

## 2019-07-18 ENCOUNTER — Ambulatory Visit: Payer: Medicare HMO | Admitting: Internal Medicine

## 2019-07-18 ENCOUNTER — Other Ambulatory Visit: Payer: Self-pay

## 2019-07-18 ENCOUNTER — Encounter: Payer: Self-pay | Admitting: Emergency Medicine

## 2019-07-18 ENCOUNTER — Emergency Department: Payer: Medicare HMO

## 2019-07-18 ENCOUNTER — Encounter: Payer: Self-pay | Admitting: Internal Medicine

## 2019-07-18 ENCOUNTER — Inpatient Hospital Stay
Admission: EM | Admit: 2019-07-18 | Discharge: 2019-07-21 | DRG: 419 | Disposition: A | Discharge status: Home / Self Care | Payer: Medicare HMO | Attending: Surgery | Admitting: Surgery

## 2019-07-18 DIAGNOSIS — Z9861 Coronary angioplasty status: Secondary | ICD-10-CM | POA: Diagnosis not present

## 2019-07-18 DIAGNOSIS — I10 Essential (primary) hypertension: Secondary | ICD-10-CM | POA: Diagnosis present

## 2019-07-18 DIAGNOSIS — Z7902 Long term (current) use of antithrombotics/antiplatelets: Secondary | ICD-10-CM

## 2019-07-18 DIAGNOSIS — K449 Diaphragmatic hernia without obstruction or gangrene: Secondary | ICD-10-CM | POA: Diagnosis present

## 2019-07-18 DIAGNOSIS — Z880 Allergy status to penicillin: Secondary | ICD-10-CM | POA: Diagnosis not present

## 2019-07-18 DIAGNOSIS — K5730 Diverticulosis of large intestine without perforation or abscess without bleeding: Secondary | ICD-10-CM | POA: Diagnosis present

## 2019-07-18 DIAGNOSIS — K82A1 Gangrene of gallbladder in cholecystitis: Secondary | ICD-10-CM | POA: Diagnosis present

## 2019-07-18 DIAGNOSIS — Z800 Family history of malignant neoplasm of digestive organs: Secondary | ICD-10-CM | POA: Diagnosis not present

## 2019-07-18 DIAGNOSIS — M109 Gout, unspecified: Secondary | ICD-10-CM | POA: Diagnosis present

## 2019-07-18 DIAGNOSIS — K219 Gastro-esophageal reflux disease without esophagitis: Secondary | ICD-10-CM | POA: Diagnosis present

## 2019-07-18 DIAGNOSIS — I2510 Atherosclerotic heart disease of native coronary artery without angina pectoris: Secondary | ICD-10-CM | POA: Diagnosis present

## 2019-07-18 DIAGNOSIS — Z8261 Family history of arthritis: Secondary | ICD-10-CM | POA: Diagnosis not present

## 2019-07-18 DIAGNOSIS — Z8249 Family history of ischemic heart disease and other diseases of the circulatory system: Secondary | ICD-10-CM | POA: Diagnosis not present

## 2019-07-18 DIAGNOSIS — Z8349 Family history of other endocrine, nutritional and metabolic diseases: Secondary | ICD-10-CM | POA: Diagnosis not present

## 2019-07-18 DIAGNOSIS — K828 Other specified diseases of gallbladder: Secondary | ICD-10-CM | POA: Diagnosis present

## 2019-07-18 DIAGNOSIS — R1011 Right upper quadrant pain: Secondary | ICD-10-CM

## 2019-07-18 DIAGNOSIS — K810 Acute cholecystitis: Secondary | ICD-10-CM | POA: Diagnosis present

## 2019-07-18 DIAGNOSIS — K5790 Diverticulosis of intestine, part unspecified, without perforation or abscess without bleeding: Secondary | ICD-10-CM | POA: Diagnosis present

## 2019-07-18 DIAGNOSIS — N200 Calculus of kidney: Secondary | ICD-10-CM | POA: Diagnosis present

## 2019-07-18 DIAGNOSIS — Z79899 Other long term (current) drug therapy: Secondary | ICD-10-CM

## 2019-07-18 DIAGNOSIS — Z20828 Contact with and (suspected) exposure to other viral communicable diseases: Secondary | ICD-10-CM | POA: Diagnosis present

## 2019-07-18 DIAGNOSIS — K4020 Bilateral inguinal hernia, without obstruction or gangrene, not specified as recurrent: Secondary | ICD-10-CM | POA: Diagnosis present

## 2019-07-18 DIAGNOSIS — I700 Atherosclerosis of aorta: Secondary | ICD-10-CM | POA: Diagnosis present

## 2019-07-18 DIAGNOSIS — Z823 Family history of stroke: Secondary | ICD-10-CM | POA: Diagnosis not present

## 2019-07-18 DIAGNOSIS — Z820 Family history of epilepsy and other diseases of the nervous system: Secondary | ICD-10-CM

## 2019-07-18 DIAGNOSIS — Z7982 Long term (current) use of aspirin: Secondary | ICD-10-CM

## 2019-07-18 LAB — COMPREHENSIVE METABOLIC PANEL
ALT: 25 U/L (ref 0–44)
AST: 30 U/L (ref 15–41)
Albumin: 4.2 g/dL (ref 3.5–5.0)
Alkaline Phosphatase: 61 U/L (ref 38–126)
Anion gap: 12 (ref 5–15)
BUN: 14 mg/dL (ref 8–23)
CO2: 24 mmol/L (ref 22–32)
Calcium: 9.1 mg/dL (ref 8.9–10.3)
Chloride: 100 mmol/L (ref 98–111)
Creatinine, Ser: 0.91 mg/dL (ref 0.61–1.24)
GFR calc Af Amer: >60 mL/min (ref >60)
GFR calc non Af Amer: >60 mL/min (ref >60)
Glucose, Bld: 155 mg/dL — ABNORMAL HIGH (ref 70–99)
Potassium: 3.7 mmol/L (ref 3.5–5.1)
Sodium: 136 mmol/L (ref 135–145)
Total Bilirubin: 2.2 mg/dL — ABNORMAL HIGH (ref 0.3–1.2)
Total Protein: 8.3 g/dL — ABNORMAL HIGH (ref 6.5–8.1)

## 2019-07-18 LAB — CBC
HCT: 45.3 % (ref 39.0–52.0)
Hemoglobin: 15.5 g/dL (ref 13.0–17.0)
MCH: 28.9 pg (ref 26.0–34.0)
MCHC: 34.2 g/dL (ref 30.0–36.0)
MCV: 84.4 fL (ref 80.0–100.0)
Platelets: 259 10*3/uL (ref 150–400)
RBC: 5.37 MIL/uL (ref 4.22–5.81)
RDW: 12.8 % (ref 11.5–15.5)
WBC: 18.7 10*3/uL — ABNORMAL HIGH (ref 4.0–10.5)
nRBC: 0 % (ref 0.0–0.2)

## 2019-07-18 LAB — URINALYSIS, COMPLETE (UACMP) WITH MICROSCOPIC
Bacteria, UA: NONE SEEN
Bilirubin Urine: NEGATIVE
Glucose, UA: NEGATIVE mg/dL
Ketones, ur: 20 mg/dL — AB
Leukocytes,Ua: NEGATIVE
Nitrite: NEGATIVE
Protein, ur: 100 mg/dL — AB
Specific Gravity, Urine: 1.025 (ref 1.005–1.030)
pH: 5 (ref 5.0–8.0)

## 2019-07-18 LAB — LIPASE, BLOOD: Lipase: 25 U/L (ref 11–51)

## 2019-07-18 LAB — SARS CORONAVIRUS 2 BY RT PCR (HOSPITAL ORDER, PERFORMED IN ~~LOC~~ HOSPITAL LAB): SARS Coronavirus 2: NEGATIVE

## 2019-07-18 LAB — BILIRUBIN, DIRECT: Bilirubin, Direct: 0.3 mg/dL — ABNORMAL HIGH (ref 0.0–0.2)

## 2019-07-18 LAB — TROPONIN I (HIGH SENSITIVITY)
Troponin I (High Sensitivity): 35 ng/L — ABNORMAL HIGH (ref <18)
Troponin I (High Sensitivity): 52 ng/L — ABNORMAL HIGH (ref <18)

## 2019-07-18 MED ORDER — ONDANSETRON 4 MG PO TBDP: 4.0000 mg | ORAL_TABLET | Freq: Four times a day (QID) | ORAL | Status: DC | PRN

## 2019-07-18 MED ORDER — ONDANSETRON HCL 4 MG/2ML IJ SOLN: 4.0000 mg | Freq: Four times a day (QID) | INTRAMUSCULAR | Status: DC | PRN

## 2019-07-18 MED ORDER — HYDROMORPHONE HCL 1 MG/ML IJ SOLN: 0.5000 mg | INTRAMUSCULAR | Status: DC | PRN

## 2019-07-18 MED ORDER — CIPROFLOXACIN IN D5W 400 MG/200ML IV SOLN
400.0000 mg | Freq: Two times a day (BID) | INTRAVENOUS | Status: DC
  Administered 2019-07-18 – 2019-07-19 (×2): 400 mg via INTRAVENOUS
  Filled 2019-07-18 (×4): qty 200

## 2019-07-18 MED ORDER — METRONIDAZOLE IN NACL 5-0.79 MG/ML-% IV SOLN
500.0000 mg | Freq: Once | INTRAVENOUS | Status: AC
  Administered 2019-07-18: 500 mg via INTRAVENOUS
  Filled 2019-07-18: qty 100

## 2019-07-18 MED ORDER — INDOCYANINE GREEN 25 MG IV SOLR
2.5000 mg | Freq: Once | INTRAVENOUS | Status: DC
  Filled 2019-07-18: qty 25

## 2019-07-18 MED ORDER — SODIUM CHLORIDE 0.9 % IV BOLUS
500.0000 mL | Freq: Once | INTRAVENOUS | Status: AC
  Administered 2019-07-18: 500 mL via INTRAVENOUS

## 2019-07-18 MED ORDER — ACETAMINOPHEN 500 MG PO TABS
1000.0000 mg | ORAL_TABLET | Freq: Four times a day (QID) | ORAL | Status: DC
  Administered 2019-07-18 – 2019-07-19 (×3): 1000 mg via ORAL
  Filled 2019-07-18 (×3): qty 2

## 2019-07-18 MED ORDER — METRONIDAZOLE IN NACL 5-0.79 MG/ML-% IV SOLN
500.0000 mg | Freq: Three times a day (TID) | INTRAVENOUS | Status: DC
  Administered 2019-07-18 – 2019-07-19 (×2): 500 mg via INTRAVENOUS
  Filled 2019-07-18 (×5): qty 100

## 2019-07-18 MED ORDER — PANTOPRAZOLE SODIUM 40 MG IV SOLR
40.0000 mg | Freq: Every day | INTRAVENOUS | Status: DC
  Filled 2019-07-18 (×2): qty 40

## 2019-07-18 MED ORDER — LACTATED RINGERS IV SOLN
INTRAVENOUS | Status: DC
  Administered 2019-07-18 – 2019-07-19 (×2): via INTRAVENOUS

## 2019-07-18 MED ORDER — METOPROLOL TARTRATE 5 MG/5ML IV SOLN
5.0000 mg | Freq: Four times a day (QID) | INTRAVENOUS | Status: DC | PRN
  Filled 2019-07-18: qty 5

## 2019-07-18 MED ORDER — IOHEXOL 300 MG/ML  SOLN
100.0000 mL | Freq: Once | INTRAMUSCULAR | Status: AC | PRN
  Administered 2019-07-18: 100 mL via INTRAVENOUS

## 2019-07-18 MED ORDER — LATANOPROST 0.005 % OP SOLN
1.0000 [drp] | Freq: Every day | OPHTHALMIC | Status: DC
  Filled 2019-07-18: qty 2.5

## 2019-07-18 MED ORDER — LEVETIRACETAM 500 MG PO TABS
500.0000 mg | ORAL_TABLET | Freq: Every day | ORAL | Status: DC
  Administered 2019-07-19: 08:00:00 500 mg via ORAL
  Filled 2019-07-18: qty 1

## 2019-07-18 MED ORDER — KETAMINE HCL 10 MG/ML IJ SOLN
0.3000 mg/kg | Freq: Once | INTRAMUSCULAR | Status: AC
  Administered 2019-07-18: 23 mg via INTRAVENOUS
  Filled 2019-07-18: qty 1

## 2019-07-18 MED ORDER — SODIUM CHLORIDE 0.9 % IV SOLN
2.0000 g | Freq: Once | INTRAVENOUS | Status: AC
  Administered 2019-07-18: 2 g via INTRAVENOUS
  Filled 2019-07-18: qty 20

## 2019-07-18 NOTE — H&P (Signed)
[facility] SURGICAL HISTORY & PHYSICAL (cpt [phone])  HISTORY OF PRESENT ILLNESS (HPI):  78 y.o. male presented to [facility] ED originally on 11/14 for intermittent RUQ abdominal pain. He underwent work up and [place] that revealed gallbladder wall thickening but his pain improved with medications and he was discharged home. Since then he continues to notice worsening RUQ and right sided abdominal pain. Described as a sharp pain. No radiation. Exacerbated with inspiration and movement. No relief at this time. He endorses associated decreased PO intake and nausea. No yellowing of his eyes or skin, itching,  fever, chills, cough, congestion, CP, or SOB, or bowel/urinary changes. He does note a history of similar pain in the past which onset with eating fatty meals. However these pains always spontaneously resolved. Previous abdominal surgeries consistent for bilateral inguinal hernia repairs. Of note, he has a history of CAD s/p PCI in 2015 and is on Plavix but he last took this on 11/12. Work up in the ED was concerning for leukocytosis to 18K, mild hyperbilirubinemia, and distended gallbladder with surrounding inflammatory changes although is without cholelithiasis.      General surgery is consulted by emergency medicine physician Dr Carrie Mew, MD for evaluation and management of acute acalculous cholecystitis.    PAST MEDICAL HISTORY (PMH):  Past Medical History:  Diagnosis Date  . Allergy   . Arthritis   . Coronary artery disease   . Diverticulosis of colon   . GERD (gastroesophageal reflux disease)    ESOPHAGEAL REFLUX  . Glaucoma   . Gout   . Hemorrhoids   . History of kidney stones   . Hx of colonic polyp   . Hypercholesterolemia   . Hypertension   . Schatzki's ring   . Seizures ([place])     Reviewed. Otherwise negative.   PAST SURGICAL HISTORY ([place]):  Past Surgical History:  Procedure Laterality Date  . CARDIAC CATHETERIZATION    . COLONOSCOPY WITH PROPOFOL N/A  05/18/2017   Procedure: COLONOSCOPY WITH PROPOFOL;  Surgeon: Manya Silvas, MD;  Location: [facility] ENDOSCOPY;  Service: Endoscopy;  Laterality: N/A;  . CORONARY ANGIOPLASTY    . EYE SURGERY  07/20/13   cataract  . HERNIA REPAIR Right 2008 & 2012  . VASECTOMY  1984    Reviewed. Otherwise negative.   MEDICATIONS:  Prior to Admission medications   Medication Sig Start Date End Date Taking? Authorizing Provider  aspirin 81 MG tablet Take 81 mg by mouth at bedtime.   Yes [provider]  azelastine (ASTELIN) 0.1 % nasal spray Place 1 spray into both nostrils 2 (two) times daily. 03/19/17  Yes Einar Pheasant, MD  Cholecalciferol (VITAMIN D3) 2000 UNITS TABS Take 2,000 Units by mouth daily.    Yes [provider]  clopidogrel (PLAVIX) 75 MG tablet Take 75 mg by mouth at bedtime.    Yes [provider]  famotidine (PEPCID) 20 MG tablet Take 1 tablet (20 mg total) by mouth 2 (two) times daily for 7 days. 07/16/19 07/23/19 Yes Carrie Mew, MD  fexofenadine [facility] ALLERGY) 180 MG tablet Take 180 mg by mouth daily. 09/01/98  Yes [provider]  latanoprost (XALATAN) 0.005 % ophthalmic solution Place 1 drop into both eyes at bedtime.   Yes [provider]  levETIRAcetam (KEPPRA) 500 MG tablet Take 500 mg by mouth daily.    Yes [provider]  Magnesium 400 MG CAPS Take 400 mg by mouth at bedtime.    Yes [provider]  Misc  Natural Products (TART CHERRY ADVANCED PO) Take 500 mg by mouth every morning.   Yes [provider]  Multiple Vitamin (MULTIVITAMIN) tablet Take 1 tablet by mouth daily.   Yes [provider]  Omega-3 Fatty Acids (FISH OIL) 1200 MG CAPS Take 1,200 mg by mouth 2 (two) times daily.    Yes [provider]  omeprazole (PRILOSEC) 20 MG capsule TAKE 1 CAPSULE TWICE DAILY BEFORE MEALS Patient taking differently: Take 20 mg by mouth 2 (two) times daily before a meal.  03/17/19  Yes Einar Pheasant, MD  polyethylene glycol (MIRALAX / GLYCOLAX) packet Take 17 g by mouth at bedtime.   Yes [provider]  Saw Palmetto 450 MG CAPS Take 450 mg by mouth daily.    Yes [provider]  simvastatin (ZOCOR) 40 MG tablet Take 40 mg by mouth every evening.    Yes [provider]  sucralfate (CARAFATE) 1 g tablet Take 1 tablet (1 g total) by mouth 4 (four) times daily. 07/16/19  Yes Carrie Mew, MD     ALLERGIES:  Allergies  Allergen Reactions  . Penicillins Rash  . Sulfa Antibiotics Rash     SOCIAL HISTORY:  Social History   Socioeconomic History  . Marital status: Married    Spouse name: Not on file  . Number of children: Not on file  . Years of education: Not on file  . Highest education level: Not on file  Occupational History  . Not on file  Social Needs  . Financial resource strain: Not on file  . Food insecurity    Worry: Not on file    Inability: Not on file  . Transportation needs    Medical: Not on file    Non-medical: Not on file  Tobacco Use  . Smoking status: Never Smoker  . Smokeless tobacco: Never Used  Substance and Sexual Activity  . Alcohol use: No    Alcohol/week: 0.0 standard drinks  . Drug use: No  . Sexual activity: Never  Lifestyle  . Physical activity    Days per week: Not on file    Minutes per session: Not on file  . Stress: Not on file  Relationships  . Social Herbalist on phone: Not on file    Gets together: Not on file    Attends religious service: Not on file    Active member of club or organization: Not on file    Attends meetings of clubs or organizations: Not on file    Relationship status: Not on file  . Intimate partner violence    Fear of current or ex partner: Not on file    Emotionally abused: Not on file    Physically abused: Not on file    Forced sexual activity: Not on file  Other Topics Concern  . Not on file  Social History Narrative  . Not on file     FAMILY  HISTORY:  Family History  Problem Relation Age of Onset  . Arthritis Mother   . Heart disease Mother   . Stroke Mother   . Hypertension Mother   . Arthritis Father   . Hyperlipidemia Father   . Heart disease Father   . Hypertension Father   . Parkinson's disease Father   . Multiple sclerosis Sister   . Colon cancer Maternal Grandmother   . Arthritis Paternal Grandmother   . Colon cancer Paternal Grandmother     Otherwise negative.   REVIEW OF SYSTEMS:  Review of Systems  Constitutional: Positive for malaise/fatigue. Negative for chills, fever and weight loss.       + Decreased appetite  HENT: Negative for congestion and sore throat.   Respiratory: Negative for cough and shortness of breath.   Cardiovascular: Negative for chest pain and palpitations.  Gastrointestinal: Positive for abdominal pain and nausea. Negative for blood in stool, constipation, diarrhea and vomiting.  Genitourinary: Negative for dysuria and urgency.  All other systems reviewed and are negative.   VITAL SIGNS:  Temp:  [99 F (37.2 C)] 99 F (37.2 C) (11/16 0926) Pulse Rate:  [89-90] 89 (11/16 1357) Resp:  [18-20] 18 (11/16 1357) BP: (150-161)/(82-88) 150/82 (11/16 1357) SpO2:  [94 %-96 %] 96 % (11/16 1357) Weight:  [76.2 kg] 76.2 kg (11/16 0926)     Height: 5\' 8"  (172.7 cm) Weight: 76.2 kg BMI (Calculated): 25.55   PHYSICAL EXAM:  Physical Exam Vitals signs and nursing note reviewed.  Constitutional:      General: He is not in acute distress.    Appearance: He is well-developed and normal weight. He is not ill-appearing.  HENT:     Head: Normocephalic and atraumatic.  Eyes:     General: No scleral icterus.    Extraocular Movements: Extraocular movements intact.  Cardiovascular:     Rate and Rhythm: Normal rate and regular rhythm.     Heart sounds: Normal heart sounds. No murmur. No friction rub. No gallop.   Pulmonary:     Effort: Pulmonary effort is normal. No respiratory distress.      Breath sounds: Normal breath sounds. No wheezing or rhonchi.  Abdominal:     General: There is no distension.     Palpations: Abdomen is soft.     Tenderness: There is abdominal tenderness in the right upper quadrant. There is no guarding or rebound. Positive signs include Murphy's sign.     Comments: Difficult to assess true Murphy's Sign, although he ceases inspiration secondary to pain in his RUQ and is tender to palpation in the RUQ  Genitourinary:    Comments: Deferred Skin:    General: Skin is warm and dry.     Coloration: Skin is not jaundiced or pale.  Neurological:     General: No focal deficit present.     Mental Status: He is alert and oriented to person, place, and time.  Psychiatric:        Mood and Affect: Mood normal.        Behavior: Behavior normal.     INTAKE/OUTPUT:  This shift: No intake/output data recorded.  Last 2 shifts: @IOLAST2SHIFTS @  Labs:  CBC Latest Ref Rng & Units 07/18/2019 07/16/2019 06/23/2018  WBC 4.0 - 10.5 K/uL 18.7(H) 9.7 5.6  Hemoglobin 13.0 - 17.0 g/dL 15.5 14.9 15.2  Hematocrit 39.0 - 52.0 % 45.3 44.0 43.4  Platelets 150 - 400 K/uL 259 245 236.0   CMP Latest Ref Rng & Units 07/18/2019 07/16/2019 02/21/2019  Glucose 70 - 99 mg/dL 155(H) 146(H) 96  BUN 8 - 23 mg/dL 14 17 15   Creatinine 0.61 - 1.24 mg/dL 0.91 1.07 1.12  Sodium 135 - 145 mmol/L 136 141 138  Potassium 3.5 - 5.1 mmol/L 3.7 3.6 4.1  Chloride 98 - 111 mmol/L 100 107 104  CO2 22 - 32 mmol/L 24 24 27   Calcium 8.9 - 10.3 mg/dL 9.1 9.2 9.2  Total Protein 6.5 - 8.1 g/dL 8.3(H) 8.0 6.5  Total Bilirubin 0.3 - 1.2 mg/dL 2.2(H) 1.2 0.6  Alkaline  Phos 38 - 126 U/L 61 57 57  AST 15 - 41 U/L 30 26 19   ALT 0 - 44 U/L 25 22 16      Imaging studies:   CT Abdomen/Pelvis (07/18/2019) personally reviewed which show acalculous distended gallbladder with inflammatory changes, and radiologist report reviewed below:  IMPRESSION: 1. Distended gallbladder with surrounding inflammatory  changes. Findings likely reflect acute cholecystitis, particularly given findings on recent right upper quadrant ultrasound performed 07/16/2019. 2. Small volume likely reactive ascites along the right hepatic lobe and within the pelvis. 3. Small to moderate-sized hiatal hernia. 4. Colonic diverticulosis without CT evidence of diverticulitis. 5. Mild cardiomegaly with coronary artery disease. 6. Aortic atherosclerosis.  RUQ [place] (07/16/2019) personally reviewed showing gallbladder wall thickening without cholelithiasis, and radiologist report reviewed below:  IMPRESSION: Diffuse gallbladder wall thickening. No gallstones, pericholecystic fluid or sonographic Murphy's sign. Cannot rule out acalculous cholecystitis. If further imaging is clinically indicated hepatic biliary nuclear medicine scan may be helpful to assess patency of the cystic duct.  Nonobstructing right renal calculi.    Assessment/Plan: (ICD-10's: K81.0) 78 y.o. male with RUQ pain and mild leukocytosis concerning for acalculous cholecystitis with very mild hyperbilirubinemia, complicated by pertinent comorbidities including history of CAD s/p PCI in 2015 on Plavix which he last took 11/12.    - Admit to general surgery  - Okay for CLD now: NPO after midnight  - IV ABx (Cipro + Flagyl >> Day 1)  - pain control prn; antiemetics prn  - monitor abdominal examination  - trend leukocytosis; monitor bilirubin   - Will plan on robotic assisted laparoscopic cholecystectomy with Dr Christian Mate tomorrow (11/17) pending OR/Anesthesia availability  - medical management of comorbidities: hold home medications for now   - DVT prophylaxis: HOLD Plavix  All of the above findings and recommendations were discussed with the patient and his wife at bedside, and all of their questions were answered to their expressed satisfaction.  -- Edison Simon, PA-C [facility] 07/18/2019, 2:56 PM [phone] M-F: 7am -  4pm

## 2019-07-18 NOTE — ED Notes (Signed)
Report called to aaron rn.  Pt moved to room 34.  Family with pt.

## 2019-07-18 NOTE — ED Triage Notes (Signed)
Pt reports some sharp pain in his abd that started Friday night. Pt reports was here Friday night for the same and was advised to follow up with his MD. Pt reports called his MD who said they would not be able to see him other than a virtual visit and to come back to the ED. Pt reports pain gets worse when he lays, stands or sits.

## 2019-07-18 NOTE — ED Notes (Signed)
Resumed care from [place].  Pt alert  nsr on monitor.    Family with pt.  Iv fluids infusing.  Pt waiting on admission.

## 2019-07-18 NOTE — Telephone Encounter (Signed)
Pt currently being admitted.

## 2019-07-18 NOTE — ED Notes (Signed)
Pt eating dinner tray.  Family with pt.  Pt waiting on admission bed.

## 2019-07-18 NOTE — ED Provider Notes (Signed)
[facility] Emergency Department Provider Note  ____________________________________________  Time seen: Approximately 1:10 PM  I have reviewed the triage vital signs and the nursing notes.   HISTORY  Chief Complaint Abdominal Pain and Shortness of Breath    HPI Edwin [name] is a 78 y.o. male with a history of hypertension, hyperlipidemia, GERD, CAD who comes the ED complaining of right upper quadrant pain for the past 10 days.  He was seen by myself in the ED 2 days ago.  At that time he had a reassuring work-up.  Ultrasound did show gallbladder wall thickening but without other inflammatory changes, and he was feeling better after antacids.  However, since going home he has had poor appetite, poor oral intake, and worsening pain today.  Denies fever chills chest pain shortness of breath or cough.  No body aches.  Pain is severe at this time, nonradiating.      Past Medical History:  Diagnosis Date  . Allergy   . Arthritis   . Coronary artery disease   . Diverticulosis of colon   . GERD (gastroesophageal reflux disease)    ESOPHAGEAL REFLUX  . Glaucoma   . Gout   . Hemorrhoids   . History of kidney stones   . Hx of colonic polyp   . Hypercholesterolemia   . Hypertension   . Schatzki's ring   . Seizures [facility])      Patient Active Problem List   Diagnosis Date Noted  . Hyperglycemia 09/21/2017  . Decreased taste and smell 09/21/2017  . Health care maintenance 11/12/2014  . Fatigue 04/09/2014  . CAD (coronary artery disease) 12/15/2013  . Arthritis 07/31/2013  . Glaucoma 07/31/2013  . Environmental allergies 07/31/2013  . Hypercholesterolemia 07/31/2013  . Essential hypertension, benign 07/31/2013  . Kidney stones 07/31/2013  . History of colonic polyps 07/31/2013  . Seizure disorder ([place]) 07/31/2013  . GERD (gastroesophageal reflux disease) 07/31/2013  . Gout 07/31/2013     Past Surgical History:  Procedure Laterality Date  .  CARDIAC CATHETERIZATION    . COLONOSCOPY WITH PROPOFOL N/A 05/18/2017   Procedure: COLONOSCOPY WITH PROPOFOL;  Surgeon: Manya Silvas, MD;  Location: [facility] ENDOSCOPY;  Service: Endoscopy;  Laterality: N/A;  . CORONARY ANGIOPLASTY    . EYE SURGERY  07/20/13   cataract  . HERNIA REPAIR Right 2008 & 2012  . VASECTOMY  1984     Prior to Admission medications   Medication Sig Start Date End Date Taking? Authorizing Provider  Alpha-Lipoic Acid 200 MG CAPS Take by mouth.    [provider]  aspirin 81 MG tablet Take 81 mg by mouth at bedtime.    [provider]  azelastine (ASTELIN) 0.1 % nasal spray Place 1 spray into both nostrils 2 (two) times daily. 03/19/17   Einar Pheasant, MD  Cholecalciferol (VITAMIN D3) 2000 UNITS TABS Take by mouth every morning.    [provider]  ciclopirox (PENLAC) 8 % solution Apply topically at bedtime. Apply over nail and surrounding skin. Apply daily over previous coat. After seven (7) days, may remove with alcohol and continue cycle. 11/12/14   Einar Pheasant, MD  clobetasol cream (TEMOVATE) [id number] % Apply 1 application topically 2 (two) times daily. Do not use on face or genital area.  Do not use for more 7-10 days in the same place. 07/29/13   Einar Pheasant, MD  clopidogrel (PLAVIX) 75 MG tablet Take 75 mg by mouth daily.    [provider]  colchicine  0.6 MG tablet Take 0.6 mg by mouth daily as needed.    [provider]  famotidine (PEPCID) 20 MG tablet Take 1 tablet (20 mg total) by mouth 2 (two) times daily for 7 days. 07/16/19 07/23/19  Carrie Mew, MD  latanoprost (XALATAN) 0.005 % ophthalmic solution Place 1 drop into both eyes at bedtime.    [provider]  levETIRAcetam (KEPPRA) 500 MG tablet Take 500 mg by mouth daily.     [provider]  loratadine (CLARITIN) 10 MG tablet Take 10 mg by mouth daily.    [provider]  Magnesium 400 MG CAPS Take by mouth at bedtime.     [provider]  Misc Natural Products (TART CHERRY ADVANCED PO) Take 500 mg by mouth every morning.    [provider]  Multiple Vitamin (MULTIVITAMIN) tablet Take 1 tablet by mouth daily.    [provider]  Omega-3 Fatty Acids (FISH OIL) 1200 MG CAPS Take by mouth 2 (two) times daily.    [provider]  omeprazole (PRILOSEC) 20 MG capsule TAKE 1 CAPSULE TWICE DAILY BEFORE MEALS 03/17/19   Einar Pheasant, MD  polyethylene glycol (MIRALAX / GLYCOLAX) packet Take 17 g by mouth at bedtime.    [provider]  Psyllium (METAMUCIL PO) Take by mouth every morning.    [provider]  Saw Palmetto 450 MG CAPS Take by mouth every morning.    [provider]  simvastatin (ZOCOR) 40 MG tablet Take 40 mg by mouth daily.    [provider]  sucralfate (CARAFATE) 1 g tablet Take 1 tablet (1 g total) by mouth 4 (four) times daily. 07/16/19   Carrie Mew, MD     Allergies Penicillins and Sulfa antibiotics   Family History  Problem Relation Age of Onset  . Arthritis Mother   . Heart disease Mother   . Stroke Mother   . Hypertension Mother   . Arthritis Father   . Hyperlipidemia Father   . Heart disease Father   . Hypertension Father   . Parkinson's disease Father   . Multiple sclerosis Sister   . Colon cancer Maternal Grandmother   . Arthritis Paternal Grandmother   . Colon cancer Paternal Grandmother     Social History Social History   Tobacco Use  . Smoking status: Never Smoker  . Smokeless tobacco: Never Used  Substance Use Topics  . Alcohol use: No    Alcohol/week: 0.0 standard drinks  . Drug use: No    Review of Systems  Constitutional:   No fever or chills.  ENT:   No sore throat. No rhinorrhea. Cardiovascular:   No chest pain or syncope. Respiratory:   No dyspnea or cough. Gastrointestinal:   Positive as above for abdominal pain without vomiting and diarrhea.  Musculoskeletal:   Negative for  focal pain or swelling All other systems reviewed and are negative except as documented above in ROS and HPI.  ____________________________________________   PHYSICAL EXAM:  VITAL SIGNS: ED Triage Vitals [07/18/19 0926]  Enc Vitals Group     BP (!) 161/87     Pulse Rate 90     Resp 20     Temp 99 F (37.2 C)     Temp Source Oral     SpO2 94 %     Weight 168 lb (76.2 kg)     Height 5\' 8"  (1.727 m)     Head Circumference      Peak Flow  Pain Score 7     Pain Loc      Pain Edu?      Excl. in [place]?     Vital signs reviewed, nursing assessments reviewed.   Constitutional:   Alert and oriented. Non-toxic appearance. Eyes:   Conjunctivae are normal. EOMI. PERRL. ENT      Head:   Normocephalic and atraumatic.      Nose:   Wearing a mask.      Mouth/Throat:   Wearing a mask.      Neck:   No meningismus. Full ROM. Hematological/Lymphatic/Immunilogical:   No cervical lymphadenopathy. Cardiovascular:   RRR. Symmetric bilateral radial and DP pulses.  No murmurs. Cap refill less than 2 seconds. Respiratory:   Normal respiratory effort without tachypnea/retractions. Breath sounds are clear and equal bilaterally. No wheezes/rales/rhonchi. Gastrointestinal:   Soft with right-sided abdominal tenderness.  Non distended. There is no CVA tenderness.  No rebound or rigidity.  Patient is guarding.  Musculoskeletal:   Normal range of motion in all extremities. No joint effusions.  No lower extremity tenderness.  No edema. Neurologic:   Normal speech and language.  Motor grossly intact. No acute focal neurologic deficits are appreciated.  Skin:    Skin is warm, dry and intact. No rash noted.  No petechiae, purpura, or bullae.  ____________________________________________    LABS (pertinent positives/negatives) (all labs ordered are listed, but only abnormal results are displayed) Labs Reviewed  COMPREHENSIVE METABOLIC PANEL - Abnormal; Notable for the following components:       Result Value   Glucose, Bld 155 (*)    Total Protein 8.3 (*)    Total Bilirubin 2.2 (*)    All other components within normal limits  CBC - Abnormal; Notable for the following components:   WBC 18.7 (*)    All other components within normal limits  URINALYSIS, COMPLETE (UACMP) WITH MICROSCOPIC - Abnormal; Notable for the following components:   Color, Urine AMBER (*)    APPearance CLEAR (*)    Hgb urine dipstick SMALL (*)    Ketones, ur 20 (*)    Protein, ur 100 (*)    All other components within normal limits  TROPONIN I (HIGH SENSITIVITY) - Abnormal; Notable for the following components:   Troponin I (High Sensitivity) 35 (*)    All other components within normal limits  LIPASE, BLOOD  TROPONIN I (HIGH SENSITIVITY)   ____________________________________________   EKG  Interpreted by me Sinus rhythm rate of 92, left axis, normal intervals.  Right bundle branch block.  No acute ischemic changes.  ____________________________________________    RADIOLOGY  Ct Abd  Result Date: 07/18/2019 CLINICAL DATA:  Abdominal pain, acute, generalized. Additional history provided: Patient reports sharp pain in abdomen Friday night. EXAM: CT ABDOMEN AND PELVIS WITH CONTRAST TECHNIQUE: Multidetector CT imaging of the abdomen and pelvis was performed using the standard protocol following bolus administration of intravenous contrast. CONTRAST:  164mL OMNIPAQUE IOHEXOL 300 MG/ML  SOLN COMPARISON:  Abdominal ultrasound 07/16/2011 FINDINGS: Lower chest: Mild cardiomegaly. Coronary artery calcifications. Atelectasis within the dependent lung bases. Hepatobiliary: No focal liver lesion.No biliary ductal dilatation. The gallbladder is distended. No radiopaque gallstone is appreciated. Prominent inflammatory stranding within the gallbladder fossa. Pancreas: No focal lesion.No ductal dilatation or peripancreatic edema. Spleen: No focal lesion.No splenomegaly. Adrenals/Urinary Tract: -- No adrenal gland  mass. -- Symmetric renal enhancement.No renal mass. Right renal cyst. No hydronephrosis.The bladder is unremarkable. Stomach/Bowel: Small to moderate-sized hiatal hernia. The stomach is otherwise unremarkable. No dilated loops  of bowel are demonstrated to suggest bowel obstruction. There is mild wall thickening of the hepatic flexure, likely reactive. No other bowel wall thickening. The appendix is not clearly delineated. However, no significant pericecal inflammatory changes are demonstrated. Colonic diverticulosis without CT evidence of diverticulitis. Vascular/Lymphatic: -- No abdominal aortic aneurysm. Aortoiliac atherosclerosis. Additional arterial calcifications. -- The IVC, portal vein, SMV and splenic vein are patent. -- No lymphadenopathy. Reproductive: Incompletely assessed by CT modality.Enlarged prostate. Other: Small volume free fluid is present along the right hepatic lobe and within the pelvis. A small amount of edema also tracks within the right paracolic gutter. Musculoskeletal: Mild presumed chronic anterior wedging of multiple lower thoracic vertebrae. These results were called by telephone at the time of interpretation on 07/18/2019 at 2:17 pm to provider Jasmina Gendron , who verbally acknowledged these results. IMPRESSION: 1. Distended gallbladder with surrounding inflammatory changes. Findings likely reflect acute cholecystitis, particularly given findings on recent right upper quadrant ultrasound performed 07/16/2019. 2. Small volume likely reactive ascites along the right hepatic lobe and within the pelvis. 3. Small to moderate-sized hiatal hernia. 4. Colonic diverticulosis without CT evidence of diverticulitis. 5. Mild cardiomegaly with coronary artery disease. 6. Aortic atherosclerosis. Electronically Signed   By: Kellie Simmering DO   On: 07/18/2019 14:19     ____________________________________________   PROCEDURES Procedures  ____________________________________________  DIFFERENTIAL DIAGNOSIS   Cholecystitis, choledocholithiasis, appendicitis  CLINICAL IMPRESSION / ASSESSMENT AND PLAN / ED COURSE  Medications ordered in the ED: Medications  cefTRIAXone (ROCEPHIN) 2 g in sodium chloride 0.9 % 100 mL IVPB (has no administration in time range)  metroNIDAZOLE (FLAGYL) IVPB 500 mg (has no administration in time range)  ketamine (KETALAR) injection 23 mg (23 mg Intravenous Given 07/18/19 1330)  sodium chloride 0.9 % bolus 500 mL (500 mLs Intravenous New Bag/Given 07/18/19 1329)  iohexol (OMNIPAQUE) 300 MG/ML solution 100 mL (100 mLs Intravenous Contrast Given 07/18/19 1344)    Pertinent labs & imaging results that were available during my care of the patient were reviewed by me and considered in my medical decision making (see chart for details).  Edwin [name] was evaluated in Emergency Department on 07/18/2019 for the symptoms described in the history of present illness. He was evaluated in the context of the global COVID-19 pandemic, which necessitated consideration that the patient might be at risk for infection with the SARS-CoV-2 virus that causes COVID-19. Institutional protocols and algorithms that pertain to the evaluation of patients at risk for COVID-19 are in a state of rapid change based on information released by regulatory bodies including the CDC and federal and state organizations. These policies and algorithms were followed during the patient's care in the ED.     Clinical Course as of Jul 17 1425  Mon Jul 18, 2019  1254 Patient comes back to the ED with persistent abdominal pain.  White blood cell count now elevated to 18,000, some ketones in the urine indicative of poor oral intake.  I will obtain a CT scan of the abdomen and pelvis.  IV ketamine 23 mg for pain control, avoiding opioids in case he needs a HIDA  scan today.   [PS]    Clinical Course User Index [PS] Carrie Mew, MD    ----------------------------------------- 2:26 PM on 07/18/2019 -----------------------------------------  CT result discussed with radiology who confirms appearance of acute cholecystitis on the CT scan.  Discussed with surgery who requests rapid Covid screen, agrees with antibiotics, will assess for urgent surgical intervention.  Vital signs remain  normal, not septic.   ____________________________________________   FINAL CLINICAL IMPRESSION(S) / ED DIAGNOSES    Final diagnoses:  RUQ pain  Acute cholecystitis     ED Discharge Orders    None      Portions of this note were generated with dragon dictation software. Dictation errors may occur despite best attempts at proofreading.   Carrie Mew, MD 07/18/19 [id number]

## 2019-07-18 NOTE — Progress Notes (Signed)
Patient resting in bed. Wife at bedside. Vitals stable and no complaints at this time. Will continue to monitor.

## 2019-07-18 NOTE — ED Notes (Signed)
Pt alert  Family with pt 

## 2019-07-19 ENCOUNTER — Inpatient Hospital Stay: Payer: Medicare HMO | Admitting: Anesthesiology

## 2019-07-19 ENCOUNTER — Encounter
Admission: EM | Disposition: A | Discharge status: Home / Self Care | Payer: Self-pay | Source: Home / Self Care | Attending: Surgery

## 2019-07-19 DIAGNOSIS — K810 Acute cholecystitis: Secondary | ICD-10-CM | POA: Diagnosis present

## 2019-07-19 LAB — CBC
HCT: 41.5 % (ref 39.0–52.0)
HCT: 40.5 % (ref 39.0–52.0)
Hemoglobin: 14.1 g/dL (ref 13.0–17.0)
Hemoglobin: 13.8 g/dL (ref 13.0–17.0)
MCH: 28.7 pg (ref 26.0–34.0)
MCH: 28.9 pg (ref 26.0–34.0)
MCHC: 34 g/dL (ref 30.0–36.0)
MCHC: 34.1 g/dL (ref 30.0–36.0)
MCV: 84.5 fL (ref 80.0–100.0)
MCV: 84.7 fL (ref 80.0–100.0)
Platelets: 230 10*3/uL (ref 150–400)
Platelets: 222 10*3/uL (ref 150–400)
RBC: 4.91 MIL/uL (ref 4.22–5.81)
RBC: 4.78 MIL/uL (ref 4.22–5.81)
RDW: 12.9 % (ref 11.5–15.5)
RDW: 13 % (ref 11.5–15.5)
WBC: 23.6 10*3/uL — ABNORMAL HIGH (ref 4.0–10.5)
WBC: 20.8 10*3/uL — ABNORMAL HIGH (ref 4.0–10.5)
nRBC: 0 % (ref 0.0–0.2)
nRBC: 0 % (ref 0.0–0.2)

## 2019-07-19 LAB — BASIC METABOLIC PANEL
Anion gap: 9 (ref 5–15)
BUN: 15 mg/dL (ref 8–23)
CO2: 24 mmol/L (ref 22–32)
Calcium: 8.3 mg/dL — ABNORMAL LOW (ref 8.9–10.3)
Chloride: 101 mmol/L (ref 98–111)
Creatinine, Ser: 0.84 mg/dL (ref 0.61–1.24)
GFR calc Af Amer: >60 mL/min (ref >60)
GFR calc non Af Amer: >60 mL/min (ref >60)
Glucose, Bld: 127 mg/dL — ABNORMAL HIGH (ref 70–99)
Potassium: 3.5 mmol/L (ref 3.5–5.1)
Sodium: 134 mmol/L — ABNORMAL LOW (ref 135–145)

## 2019-07-19 LAB — HEPATIC FUNCTION PANEL
ALT: 26 U/L (ref 0–44)
AST: 32 U/L (ref 15–41)
Albumin: 3.3 g/dL — ABNORMAL LOW (ref 3.5–5.0)
Alkaline Phosphatase: 64 U/L (ref 38–126)
Bilirubin, Direct: 0.8 mg/dL — ABNORMAL HIGH (ref 0.0–0.2)
Indirect Bilirubin: 1.8 mg/dL — ABNORMAL HIGH (ref 0.3–0.9)
Total Bilirubin: 2.6 mg/dL — ABNORMAL HIGH (ref 0.3–1.2)
Total Protein: 6.7 g/dL (ref 6.5–8.1)

## 2019-07-19 LAB — SURGICAL PCR SCREEN
MRSA, PCR: NEGATIVE
Staphylococcus aureus: NEGATIVE

## 2019-07-19 SURGERY — CHOLECYSTECTOMY, ROBOT-ASSISTED, LAPAROSCOPIC
Anesthesia: General

## 2019-07-19 MED ORDER — PHENYLEPHRINE HCL (PRESSORS) 10 MG/ML IV SOLN
INTRAVENOUS | Status: DC | PRN
  Administered 2019-07-19 (×2): 100 ug via INTRAVENOUS
  Administered 2019-07-19: 200 ug via INTRAVENOUS
  Administered 2019-07-19 (×3): 100 ug via INTRAVENOUS
  Administered 2019-07-19: 50 ug via INTRAVENOUS

## 2019-07-19 MED ORDER — FENTANYL CITRATE (PF) 100 MCG/2ML IJ SOLN: 25.0000 ug | INTRAMUSCULAR | Status: DC | PRN

## 2019-07-19 MED ORDER — ONDANSETRON HCL 4 MG/2ML IJ SOLN
INTRAMUSCULAR | Status: DC | PRN
  Administered 2019-07-19: 4 mg via INTRAVENOUS

## 2019-07-19 MED ORDER — SODIUM CHLORIDE 0.9 % IV SOLN
INTRAVENOUS | Status: DC
  Administered 2019-07-19: 18:00:00 via INTRAVENOUS

## 2019-07-19 MED ORDER — DEXAMETHASONE SODIUM PHOSPHATE 10 MG/ML IJ SOLN
INTRAMUSCULAR | Status: DC | PRN
  Administered 2019-07-19: 10 mg via INTRAVENOUS

## 2019-07-19 MED ORDER — ACETAMINOPHEN 325 MG PO TABS
650.0000 mg | ORAL_TABLET | Freq: Four times a day (QID) | ORAL | Status: DC | PRN
  Administered 2019-07-20 (×2): 650 mg via ORAL
  Filled 2019-07-19 (×2): qty 2

## 2019-07-19 MED ORDER — BUPIVACAINE HCL (PF) 0.25 % IJ SOLN
INTRAMUSCULAR | Status: AC
  Filled 2019-07-19: qty 30

## 2019-07-19 MED ORDER — ACETAMINOPHEN 650 MG RE SUPP: 650.0000 mg | Freq: Four times a day (QID) | RECTAL | Status: DC | PRN

## 2019-07-19 MED ORDER — EPINEPHRINE PF 1 MG/ML IJ SOLN
INTRAMUSCULAR | Status: AC
  Filled 2019-07-19: qty 1

## 2019-07-19 MED ORDER — ONDANSETRON HCL 4 MG/2ML IJ SOLN: 4.0000 mg | Freq: Once | INTRAMUSCULAR | Status: DC | PRN

## 2019-07-19 MED ORDER — SODIUM CHLORIDE 0.9 % IV SOLN
INTRAVENOUS | Status: DC | PRN
  Administered 2019-07-19: 13:00:00 25 ug/min via INTRAVENOUS

## 2019-07-19 MED ORDER — LIDOCAINE HCL (CARDIAC) PF 100 MG/5ML IV SOSY
PREFILLED_SYRINGE | INTRAVENOUS | Status: DC | PRN
  Administered 2019-07-19: 60 mg via INTRAVENOUS

## 2019-07-19 MED ORDER — ENOXAPARIN SODIUM 40 MG/0.4ML ~~LOC~~ SOLN
40.0000 mg | SUBCUTANEOUS | Status: DC
  Administered 2019-07-20 – 2019-07-21 (×2): 40 mg via SUBCUTANEOUS
  Filled 2019-07-19 (×2): qty 0.4

## 2019-07-19 MED ORDER — KETOROLAC TROMETHAMINE 15 MG/ML IJ SOLN: 15.0000 mg | Freq: Four times a day (QID) | INTRAMUSCULAR | Status: DC | PRN

## 2019-07-19 MED ORDER — METRONIDAZOLE IN NACL 5-0.79 MG/ML-% IV SOLN
500.0000 mg | Freq: Three times a day (TID) | INTRAVENOUS | Status: DC
  Administered 2019-07-19 – 2019-07-21 (×6): 500 mg via INTRAVENOUS
  Filled 2019-07-19 (×8): qty 100

## 2019-07-19 MED ORDER — OXYCODONE HCL 5 MG PO TABS: 5.0000 mg | ORAL_TABLET | Freq: Once | ORAL | Status: DC | PRN

## 2019-07-19 MED ORDER — ROCURONIUM BROMIDE 100 MG/10ML IV SOLN
INTRAVENOUS | Status: DC | PRN
  Administered 2019-07-19: 30 mg via INTRAVENOUS
  Administered 2019-07-19: 20 mg via INTRAVENOUS
  Administered 2019-07-19: 40 mg via INTRAVENOUS
  Administered 2019-07-19: 10 mg via INTRAVENOUS

## 2019-07-19 MED ORDER — ONDANSETRON HCL 4 MG/2ML IJ SOLN: 4.0000 mg | Freq: Four times a day (QID) | INTRAMUSCULAR | Status: DC | PRN

## 2019-07-19 MED ORDER — FENTANYL CITRATE (PF) 100 MCG/2ML IJ SOLN
INTRAMUSCULAR | Status: AC
  Filled 2019-07-19: qty 2

## 2019-07-19 MED ORDER — SODIUM CHLORIDE FLUSH 0.9 % IV SOLN
INTRAVENOUS | Status: AC
  Administered 2019-07-19: 11:00:00
  Filled 2019-07-19: qty 10

## 2019-07-19 MED ORDER — FENTANYL CITRATE (PF) 100 MCG/2ML IJ SOLN
INTRAMUSCULAR | Status: DC | PRN
  Administered 2019-07-19 (×2): 50 ug via INTRAVENOUS
  Administered 2019-07-19: 100 ug via INTRAVENOUS

## 2019-07-19 MED ORDER — PROPOFOL 10 MG/ML IV BOLUS
INTRAVENOUS | Status: AC
  Filled 2019-07-19: qty 20

## 2019-07-19 MED ORDER — HYDROCODONE-ACETAMINOPHEN 5-325 MG PO TABS
1.0000 | ORAL_TABLET | ORAL | Status: DC | PRN
  Administered 2019-07-20: 1 via ORAL
  Filled 2019-07-19: qty 1

## 2019-07-19 MED ORDER — OXYCODONE HCL 5 MG/5ML PO SOLN: 5.0000 mg | Freq: Once | ORAL | Status: DC | PRN

## 2019-07-19 MED ORDER — MUPIROCIN 2 % EX OINT
1.0000 "application " | TOPICAL_OINTMENT | Freq: Two times a day (BID) | CUTANEOUS | Status: DC
  Administered 2019-07-19 – 2019-07-20 (×3): 1 via NASAL
  Filled 2019-07-19: qty 22

## 2019-07-19 MED ORDER — SUGAMMADEX SODIUM 200 MG/2ML IV SOLN
INTRAVENOUS | Status: DC | PRN
  Administered 2019-07-19: 180 mg via INTRAVENOUS

## 2019-07-19 MED ORDER — LIDOCAINE HCL (PF) 2 % IJ SOLN
INTRAMUSCULAR | Status: AC
  Filled 2019-07-19: qty 10

## 2019-07-19 MED ORDER — DEXMEDETOMIDINE HCL 200 MCG/2ML IV SOLN
INTRAVENOUS | Status: DC | PRN
  Administered 2019-07-19 (×2): 4 ug via INTRAVENOUS

## 2019-07-19 MED ORDER — ONDANSETRON 4 MG PO TBDP: 4.0000 mg | ORAL_TABLET | Freq: Four times a day (QID) | ORAL | Status: DC | PRN

## 2019-07-19 MED ORDER — METRONIDAZOLE IN NACL 5-0.79 MG/ML-% IV SOLN: 500.0000 mg | Freq: Four times a day (QID) | INTRAVENOUS | Status: DC

## 2019-07-19 MED ORDER — ROCURONIUM BROMIDE 50 MG/5ML IV SOLN
INTRAVENOUS | Status: AC
  Filled 2019-07-19: qty 1

## 2019-07-19 MED ORDER — PROPOFOL 10 MG/ML IV BOLUS
INTRAVENOUS | Status: DC | PRN
  Administered 2019-07-19: 150 mg via INTRAVENOUS

## 2019-07-19 MED ORDER — CIPROFLOXACIN IN D5W 400 MG/200ML IV SOLN
400.0000 mg | Freq: Two times a day (BID) | INTRAVENOUS | Status: DC
  Administered 2019-07-19 – 2019-07-21 (×4): 400 mg via INTRAVENOUS
  Filled 2019-07-19 (×6): qty 200

## 2019-07-19 MED ORDER — BUPIVACAINE-EPINEPHRINE (PF) 0.25% -1:200000 IJ SOLN
INTRAMUSCULAR | Status: DC | PRN
  Administered 2019-07-19: 30 mL

## 2019-07-19 SURGICAL SUPPLY — 44 items
BULB RESERV EVAC DRAIN JP 100C (MISCELLANEOUS) ×3 IMPLANT
CANISTER SUCT 1200ML W/VALVE (MISCELLANEOUS) ×3 IMPLANT
CHLORAPREP W/TINT 26 (MISCELLANEOUS) ×3 IMPLANT
CLIP VESOLOCK MED LG 6/CT (CLIP) ×3 IMPLANT
COVER TIP SHEARS 8 DVNC (MISCELLANEOUS) ×1 IMPLANT
COVER TIP SHEARS 8MM DA VINCI (MISCELLANEOUS) ×2
COVER WAND RF STERILE (DRAPES) ×3 IMPLANT
DECANTER SPIKE VIAL GLASS SM (MISCELLANEOUS) ×3 IMPLANT
DEFOGGER SCOPE WARMER CLEARIFY (MISCELLANEOUS) ×3 IMPLANT
DERMABOND ADVANCED (GAUZE/BANDAGES/DRESSINGS) ×2
DERMABOND ADVANCED .7 DNX12 (GAUZE/BANDAGES/DRESSINGS) ×1 IMPLANT
DRAIN CHANNEL JP 19F (MISCELLANEOUS) ×3 IMPLANT
DRAPE 3/4 80X56 (DRAPES) ×3 IMPLANT
DRAPE ARM DVNC X/XI (DISPOSABLE) ×4 IMPLANT
DRAPE COLUMN DVNC XI (DISPOSABLE) ×1 IMPLANT
DRAPE DA VINCI XI ARM (DISPOSABLE) ×8
DRAPE DA VINCI XI COLUMN (DISPOSABLE) ×2
GLOVE ORTHO TXT STRL SZ7.5 (GLOVE) ×6 IMPLANT
GOWN STRL REUS W/ TWL LRG LVL3 (GOWN DISPOSABLE) ×4 IMPLANT
GOWN STRL REUS W/TWL LRG LVL3 (GOWN DISPOSABLE) ×8
GRASPER SUT TROCAR 14GX15 (MISCELLANEOUS) ×3 IMPLANT
IRRIGATION STRYKERFLOW (MISCELLANEOUS) IMPLANT
IRRIGATOR STRYKERFLOW (MISCELLANEOUS)
IRRIGATOR SUCT 8 DISP DVNC XI (IRRIGATION / IRRIGATOR) ×1 IMPLANT
IRRIGATOR SUCTION 8MM XI DISP (IRRIGATION / IRRIGATOR) ×2
IV NS 1000ML (IV SOLUTION) ×6
IV NS 1000ML BAXH (IV SOLUTION) ×3 IMPLANT
KIT PINK PAD W/HEAD ARE REST (MISCELLANEOUS) ×3
KIT PINK PAD W/HEAD ARM REST (MISCELLANEOUS) ×1 IMPLANT
KIT TURNOVER KIT A (KITS) ×3 IMPLANT
LABEL OR SOLS (LABEL) ×3 IMPLANT
NEEDLE HYPO 22GX1.5 SAFETY (NEEDLE) ×3 IMPLANT
NEEDLE VERESS 14GA 120MM (NEEDLE) ×3 IMPLANT
NS IRRIG 500ML POUR BTL (IV SOLUTION) IMPLANT
PACK LAP CHOLECYSTECTOMY (MISCELLANEOUS) ×3 IMPLANT
POUCH SPECIMEN RETRIEVAL 10MM (ENDOMECHANICALS) ×3 IMPLANT
SEAL CANN UNIV 5-8 DVNC XI (MISCELLANEOUS) ×4 IMPLANT
SEAL XI 5MM-8MM UNIVERSAL (MISCELLANEOUS) ×8
SET TUBE SMOKE EVAC HIGH FLOW (TUBING) ×3 IMPLANT
SOLUTION ELECTROLUBE (MISCELLANEOUS) ×3 IMPLANT
SUT MNCRL AB 4-0 PS2 18 (SUTURE) ×3 IMPLANT
SUT VICRYL 0 AB UR-6 (SUTURE) ×3 IMPLANT
TROCAR XCEL 12X100 BLDLESS (ENDOMECHANICALS) ×3 IMPLANT
TUBING EVAC SMOKE HEATED PNEUM (TUBING) ×3 IMPLANT

## 2019-07-19 NOTE — Op Note (Signed)
Robotic cholecystectomy  Pre-operative Diagnosis: Acalculus acute cholecystitis  Post-operative Diagnosis:  Gangrenous cholecystitis.  Procedure: Robotic assisted laparoscopic cholecystectomy.  Surgeon: Ronny Bacon, M.D., FACS  Anesthesia: General. with endotracheal tube   Findings: Distended with gangrenous changes, omental encasement, tenuous cystic duct clipped twice, could not appreciate ICG visualization of common ducts.   Estimated Blood Loss: 150 mL         Drains: None         Specimens: Gallbladder           Complications: none   Procedure Details  The patient was seen again in the Holding Room.  2.5 mg dose of ICG was administered intravenously.  The benefits, complications, treatment options, risks and expected outcomes were discussed with the patient. The likelihood of improving the patient's symptoms with return to their baseline status is good.  The patient and/or family concurred with the proposed plan, giving informed consent, again alternatives reviewed.  The patient was taken to Operating Room, identified, and the procedure verified as robotic assisted laparoscopic cholecystectomy.   Prior to the induction of general anesthesia, antibiotic status was reviewed doses near due would be administered. VTE prophylaxis was in place. General endotracheal anesthesia was then administered and tolerated well. The patient was positioned in the supine position.  After the induction, the abdomen was prepped with Chloraprep and draped in the sterile fashion.    A Time Out was held and the above information confirmed.  After local infiltration of quarter percent Marcaine with epinephrine, stab incision was made left upper quadrant.  Just below the costal margin approximately midclavicular line the Veress needle is passed with sensation of the layers to penetrate the abdominal wall and into the peritoneum.  Saline drop test is confirmed peritoneal placement.  Insufflation is  initiated with carbon dioxide to pressures of 15 mmHg.  Periumbilical local infiltration with quarter percent Marcaine with epinephrine is utilized.  Made a 12 mm incision on the left periumbilical site, I advanced an optical 12 mm port under direct visualization into the peritoneal cavity.  Once the peritoneum was penetrated, insufflation was transferred.  The trocar was then advanced into the abdominal cavity under direct visualization. Pneumoperitoneum was then continued with CO2 at 14 mmHg or less and tolerated well without any adverse changes in the patient's vital signs.  Two 8.5-mm ports were placed in the right lower quadrant, and one to the left upper quadrant, all under direct vision. All skin incisions  were infiltrated with a local anesthetic agent before making the incision and placing the trocars.   The patient was positioned  in reverse Trendelenburg, tilted the patient's left side down.  Da Vinci XI robot was then positioned on to the patient's left side, and docked.  The gallbladder was identified, as it was encased with adjacent omentum, dissection of the overlying omentum was then performed with blunt dissection freeing exposure of the focally gangrenous serosa of the fundus.  I felt it was necessary to decompress the gallbladder and the Veress needle was passed through the abdominal wall into the fundus and the contents were aspirated.  The fundus is then grasped via the arm 4 Prograsp and retracted cephalad. Adhesions were lysed with hook cautery.  A thick rind of peritoneal tissues were incised and dissected peeling away and enabling the infundibulum to be identified, grasped and the gallbladder neck was fully mobilized from the liver fossa, exposing the peritoneum overlying the triangle of Calot.  I followed the gallbladder neck down onto  the cystic duct dissected using cautery hook, and robotic suction irrigator. An extended critical view of the cystic duct and cystic artery was  obtained.  I did not pursue dissection of the cystic duct to the common duct, although the ICG via FireFly clearly showed the hepatic parenchyma, I was not able to actually visualize the biliary ductal system.  I did not wish to chance of further dissection of the markedly compromised cystic duct, while I had good control of it with plenty of room to place clips.  This was immediately adjacent to the gallbladder neck. The cystic duct was clearly identified and dissected to isolation.   Artery well isolated and divided with bipolar electrocautery and hook electrocautery, and the cystic duct was double clipped and divided.  The gallbladder was taken from the gallbladder fossa in a retrograde fashion with the electrocautery, and blunt dissection utilizing the robotic suction irrigator. The gallbladder was removed and placed in an Endocatch bag.  The liver bed was irrigated and inspected. Hemostasis was achieved with the electrocautery.  The robot was undocked and moved away from the operative field. Copious irrigation was utilized and was repeatedly aspirated until clear.  The gallbladder and Endocatch sac were then removed through the periumbilical port site.   Inspection of the right upper quadrant was performed. No bleeding, bile duct injury or leak, or bowel injury was noted.  A 19 Blake round drain was placed in the gallbladder fossa and drawn out of the right lower quadrant incision.  It was secured to the skin with 3-0 nylon.  Pneumoperitoneum was released.  The periumbilical port site was closed with interrumpted 0 Vicryl sutures. 4-0 subcuticular Monocryl was used to close the skin. Dermabond was  applied.  The patient was then extubated and brought to the recovery room in stable condition. Sponge, lap, and needle counts were correct at closure and at the conclusion of the case.               Ronny Bacon, M.D., [facility] 07/19/2019

## 2019-07-19 NOTE — Progress Notes (Signed)
Report called to [facility]. Patient is stable and being transported.

## 2019-07-19 NOTE — Progress Notes (Signed)
[facility] Day(s): 1.   Interval History: Patient seen and examined, no acute events or new complaints overnight. Patient reports he is feeling better this morning. His abdominal pain has improved some compared to in the ED yesterday. No fever, chills, nausea, or emesis.  Leukocytosis worsening to 23K this morning Hyperbilirubinemia to 2.6 with direct component only 0.8 Plan for laparoscopic cholecystectomy today   Vital signs in last 24 hours: [min-max] current  Temp:  [98.5 F (36.9 C)-99.3 F (37.4 C)] 99.3 F (37.4 C) (11/17 0517) Pulse Rate:  [70-94] 78 (11/17 0517) Resp:  [18-30] 20 (11/17 0517) BP: (110-161)/(64-92) 135/76 (11/17 0517) SpO2:  [91 %-96 %] 93 % (11/17 0517) Weight:  [76.2 kg] 76.2 kg (11/16 0926)     Height: 5\' 8"  (172.7 cm) Weight: 76.2 kg BMI (Calculated): 25.55   Intake/Output last 2 shifts:  No intake/output data recorded.   Physical Exam:  Constitutional: alert, cooperative and no distress  Respiratory: breathing non-labored at rest  Cardiovascular: regular rate and sinus rhythm  Gastrointestinal: soft, improved RUQ tendenress, and non-distended. No rebound/guarding Integumentary: warm, dry, no jaundice  Labs:  CBC Latest Ref Rng & Units 07/19/2019 07/18/2019 07/16/2019  WBC 4.0 - 10.5 K/uL 23.6(H) 18.7(H) 9.7  Hemoglobin 13.0 - 17.0 g/dL 14.1 15.5 14.9  Hematocrit 39.0 - 52.0 % 41.5 45.3 44.0  Platelets 150 - 400 K/uL 230 259 245   CMP Latest Ref Rng & Units 07/19/2019 07/18/2019 07/16/2019  Glucose 70 - 99 mg/dL 127(H) 155(H) 146(H)  BUN 8 - 23 mg/dL 15 14 17   Creatinine 0.61 - 1.24 mg/dL 0.84 0.91 1.07  Sodium 135 - 145 mmol/L 134(L) 136 141  Potassium 3.5 - 5.1 mmol/L 3.5 3.7 3.6  Chloride 98 - 111 mmol/L 101 100 107  CO2 22 - 32 mmol/L 24 24 24   Calcium 8.9 - 10.3 mg/dL 8.3(L) 9.1 9.2  Total Protein 6.5 - 8.1 g/dL 6.7 8.3(H) 8.0  Total Bilirubin 0.3 - 1.2 mg/dL 2.6(H) 2.2(H) 1.2   Alkaline Phos 38 - 126 U/L 64 61 57  AST 15 - 41 U/L 32 30 26  ALT 0 - 44 U/L 26 25 22      Imaging studies: No new pertinent imaging studies   Assessment/Plan: (ICD-10's: K81.0) 78 y.o. male with improved abdominal pain still with leukocytosis and mild hyperbilirubinemia admitted with acalculous cholecystitis with very mild hyperbilirubinemia, complicated by pertinent comorbidities including history of CAD s/p PCI in 2015 on Plavix which he last took 11/12.    - NPO + IVF             - IV ABx (Cipro + Flagyl >> Day 2)             - pain control prn; antiemetics prn             - monitor abdominal examination             - trend leukocytosis; monitor bilirubin              - Will plan on robotic assisted laparoscopic cholecystectomy with Dr Christian Mate this afternoon pending OR/Anesthesia availability  -All risks, benefits, and alternatives to above procedure(s) were discussed with the patient, all of his questions were answered to his expressed satisfaction, patient expresses he wishes to proceed, and informed consent was obtained.             - medical management of comorbidities: hold home medications for now              -  DVT prophylaxis: HOLD Plavix  All of the above findings and recommendations were discussed with the patient, and the medical team, and all of patient's questions were answered to his expressed satisfaction.  -- Edison Simon, PA-C [facility] 07/19/2019, 7:23 AM [phone] M-F: 7am - 4pm

## 2019-07-19 NOTE — Transfer of Care (Signed)
Immediate Anesthesia Transfer of Care Note  Patient: Edwin [name]  Procedure(s) Performed: XI ROBOTIC ASSISTED LAPAROSCOPIC CHOLECYSTECTOMY (N/A )  Patient Location: PACU  Anesthesia Type:General  Level of Consciousness: drowsy and patient cooperative  Airway & Oxygen Therapy: Patient Spontanous Breathing and Patient connected to face mask oxygen  Post-op Assessment: Report given to RN and Post -op Vital signs reviewed and stable  Post vital signs: Reviewed and stable  Last Vitals:  Vitals Value Taken Time  BP 129/73 07/19/19 1501  Temp 36.3 C 07/19/19 1500  Pulse 88 07/19/19 1504  Resp 12 07/19/19 1504  SpO2 99 % 07/19/19 1504  Vitals shown include unvalidated device data.  Last Pain:  Vitals:   07/19/19 1500  TempSrc:   PainSc: Asleep      Patients Stated Pain Goal: 0 ([id number])  Complications: No apparent anesthesia complications

## 2019-07-19 NOTE — Anesthesia Post-op Follow-up Note (Signed)
Anesthesia QCDR form completed.        

## 2019-07-19 NOTE — Anesthesia Procedure Notes (Signed)
Procedure Name: Intubation Date/Time: 07/19/2019 12:27 PM Performed by: Jonna Clark, CRNA Pre-anesthesia Checklist: Patient identified, Patient being monitored, Timeout performed, Emergency Drugs available and Suction available Patient Re-evaluated:Patient Re-evaluated prior to induction Oxygen Delivery Method: Circle system utilized Preoxygenation: Pre-oxygenation with 100% oxygen Induction Type: IV induction Ventilation: Mask ventilation without difficulty Laryngoscope Size: 3 and McGraph Grade View: Grade II Tube type: Oral Tube size: 7.5 mm Number of attempts: 1 Airway Equipment and Method: Stylet Placement Confirmation: ETT inserted through vocal cords under direct vision,  positive ETCO2 and breath sounds checked- equal and bilateral Secured at: 22 cm Tube secured with: Tape Dental Injury: Teeth and Oropharynx as per pre-operative assessment  Difficulty Due To: Difficulty was anticipated and Difficult Airway- due to anterior larynx Future Recommendations: Recommend- induction with short-acting agent, and alternative techniques readily available

## 2019-07-19 NOTE — Anesthesia Preprocedure Evaluation (Addendum)
Anesthesia Evaluation  Patient identified by MRN, date of birth, ID band Patient awake    Reviewed: Allergy & Precautions, H&P , NPO status , Patient's Chart, lab work & pertinent test results  History of Anesthesia Complications Negative for: history of anesthetic complications  Airway Mallampati: I  TM Distance: <3 FB Neck ROM: full   Comment: Anterior airway expected Dental  (+) Teeth Intact   Pulmonary neg pulmonary ROS, neg sleep apnea, neg COPD, Not current smoker,           Cardiovascular hypertension, Pt. on medications + CAD and + Cardiac Stents  (-) Past MI and (-) CHF (-) dysrhythmias (-) Valvular Problems/Murmurs     Neuro/Psych Seizures - (once time, on meds), Well Controlled,  negative psych ROS   GI/Hepatic Neg liver ROS, GERD  Medicated and Controlled,diverticulosis   Endo/Other  negative endocrine ROSneg diabetes  Renal/GU Renal disease (stones)  negative genitourinary   Musculoskeletal  (+) Arthritis , Osteoarthritis,    Abdominal   Peds negative pediatric ROS (+)  Hematology negative hematology ROS (+)   Anesthesia Other Findings Past Medical History: No date: Allergy No date: Arthritis No date: Coronary artery disease No date: Diverticulosis of colon No date: GERD (gastroesophageal reflux disease)     Comment:  ESOPHAGEAL REFLUX No date: Glaucoma No date: Gout No date: Hemorrhoids No date: History of kidney stones No date: Hx of colonic polyp No date: Hypercholesterolemia No date: Hypertension No date: Schatzki's ring No date: Seizures (HCC)  Reproductive/Obstetrics negative OB ROS                           Anesthesia Physical  Anesthesia Plan  ASA: III  Anesthesia Plan: General   Post-op Pain Management:    Induction: Intravenous  PONV Risk Score and Plan: Ondansetron, Dexamethasone and Treatment may vary due to age or medical condition  Airway  Management Planned: Oral ETT and Video Laryngoscope Planned  Additional Equipment:   Intra-op Plan:   Post-operative Plan: Extubation in OR  Informed Consent: I have reviewed the patients History and Physical, chart, labs and discussed the procedure including the risks, benefits and alternatives for the proposed anesthesia with the patient or authorized representative who has indicated his/her understanding and acceptance.     Dental advisory given  Plan Discussed with: CRNA and Anesthesiologist  Anesthesia Plan Comments:       Anesthesia Quick Evaluation

## 2019-07-20 LAB — COMPREHENSIVE METABOLIC PANEL
ALT: 45 U/L — ABNORMAL HIGH (ref 0–44)
AST: 42 U/L — ABNORMAL HIGH (ref 15–41)
Albumin: 2.7 g/dL — ABNORMAL LOW (ref 3.5–5.0)
Alkaline Phosphatase: 66 U/L (ref 38–126)
Anion gap: 13 (ref 5–15)
BUN: 25 mg/dL — ABNORMAL HIGH (ref 8–23)
CO2: 25 mmol/L (ref 22–32)
Calcium: 8.2 mg/dL — ABNORMAL LOW (ref 8.9–10.3)
Chloride: 99 mmol/L (ref 98–111)
Creatinine, Ser: 0.98 mg/dL (ref 0.61–1.24)
GFR calc Af Amer: >60 mL/min (ref >60)
GFR calc non Af Amer: >60 mL/min (ref >60)
Glucose, Bld: 156 mg/dL — ABNORMAL HIGH (ref 70–99)
Potassium: 3.9 mmol/L (ref 3.5–5.1)
Sodium: 137 mmol/L (ref 135–145)
Total Bilirubin: 1.3 mg/dL — ABNORMAL HIGH (ref 0.3–1.2)
Total Protein: 6.2 g/dL — ABNORMAL LOW (ref 6.5–8.1)

## 2019-07-20 MED ORDER — LEVETIRACETAM 500 MG PO TABS
500.0000 mg | ORAL_TABLET | Freq: Every day | ORAL | Status: DC
  Administered 2019-07-20 – 2019-07-21 (×2): 500 mg via ORAL
  Filled 2019-07-20 (×2): qty 1

## 2019-07-20 MED ORDER — LATANOPROST 0.005 % OP SOLN
1.0000 [drp] | Freq: Every day | OPHTHALMIC | Status: DC
  Administered 2019-07-20 (×2): 1 [drp] via OPHTHALMIC
  Filled 2019-07-20: qty 2.5

## 2019-07-20 MED ORDER — SODIUM CHLORIDE 0.9 % IV SOLN
INTRAVENOUS | Status: DC | PRN
  Administered 2019-07-20: 16:00:00 30 mL via INTRAVENOUS

## 2019-07-20 NOTE — Progress Notes (Addendum)
[place] Hospital Day(s): 2.   Post op day(s): 1 Day Post-Op.   Interval History:  Patient seen and examined no acute events or new complaints overnight.  Patient reports his RUQ pain has improved but now has soreness at his incisions.  No fever, chills, nausea, or emesis Leukocytosis and hyperbilirubinemia improved Tolerating liquid diet to this point post operatively.  Blake drain in RUQ with 30 ccs of serosanguinous output Has not mobilized yet   Vital signs in last 24 hours: [min-max] current  Temp:  [97.3 F (36.3 C)-99 F (37.2 C)] 98.4 F (36.9 C) (11/18 0443) Pulse Rate:  [73-88] 73 (11/18 0443) Resp:  [12-20] 20 (11/18 0443) BP: (118-154)/(63-93) 118/73 (11/18 0443) SpO2:  [91 %-100 %] 93 % (11/18 0443) Weight:  [76.2 kg] 76.2 kg (11/17 1106)     Height: 5\' 8"  (172.7 cm) Weight: 76.2 kg BMI (Calculated): 25.55   Intake/Output last 2 shifts:  11/17 0701 - 11/18 0700 In: 1540 [P.O.:240; I.V.:1300] Out: 1490 [Urine:1425; Drains:30; Blood:35]   Physical Exam:  Constitutional: alert, cooperative and no distress  Respiratory: breathing non-labored at rest  Cardiovascular: regular rate and sinus rhythm  Gastrointestinal: soft, incisional soreness, and non-distended. Blake drain in RUQ with serosanguinous outupt Integumentary: laparoscopic incisions are CDI with dermabond, no erythema or drainage  Labs:  CBC Latest Ref Rng & Units 07/19/2019 07/19/2019 07/18/2019  WBC 4.0 - 10.5 K/uL 20.8(H) 23.6(H) 18.7(H)  Hemoglobin 13.0 - 17.0 g/dL 13.8 14.1 15.5  Hematocrit 39.0 - 52.0 % 40.5 41.5 45.3  Platelets 150 - 400 K/uL 222 230 259   CMP Latest Ref Rng & Units 07/20/2019 07/19/2019 07/18/2019  Glucose 70 - 99 mg/dL 156(H) 127(H) 155(H)  BUN 8 - 23 mg/dL 25(H) 15 14  Creatinine 0.61 - 1.24 mg/dL 0.98 0.84 0.91  Sodium 135 - 145 mmol/L 137 134(L) 136  Potassium 3.5 - 5.1 mmol/L 3.9 3.5 3.7  Chloride 98 - 111 mmol/L 99 101  100  CO2 22 - 32 mmol/L 25 24 24   Calcium 8.9 - 10.3 mg/dL 8.2(L) 8.3(L) 9.1  Total Protein 6.5 - 8.1 g/dL 6.2(L) 6.7 8.3(H)  Total Bilirubin 0.3 - 1.2 mg/dL 1.3(H) 2.6(H) 2.2(H)  Alkaline Phos 38 - 126 U/L 66 64 61  AST 15 - 41 U/L 42(H) 32 30  ALT 0 - 44 U/L 45(H) 26 25     Imaging studies: No new pertinent imaging studies   Assessment/Plan:  78 y.o. male with expected post-op soreness but otherwise doing well 1 Day Post-Op s/p robotic assisted laparoscopic cholecystectomy for gangrenous cholecystitis.   - ADAT   - continue IV Abx (Cipro + Flagyl >> Day 3); transition to PO at discharge   - pain control prn; antiemetics prn   - monitor abdominal examination  - monitor drain output; provide drain teaching today  - encouraged mobilization  - Medical management of comorbidities: restart home medications    - Discharge planning: if leukocytosis continues to improve, hopefully home tomorrow  All of the above findings and recommendations were discussed with the patient, and the medical team, and all of patient's questions were answered to his expressed satisfaction.  -- Edison Simon, PA-C  [facility] 07/20/2019, 9:33 AM [phone] M-F: 7am - 4pm

## 2019-07-20 NOTE — Anesthesia Postprocedure Evaluation (Signed)
Anesthesia Post Note  Patient: Edwin [name]  Procedure(s) Performed: XI ROBOTIC ASSISTED LAPAROSCOPIC CHOLECYSTECTOMY (N/A )  Patient location during evaluation: PACU Anesthesia Type: General Level of consciousness: awake and alert Pain management: pain level controlled Vital Signs Assessment: post-procedure vital signs reviewed and stable Respiratory status: spontaneous breathing, nonlabored ventilation and respiratory function stable Cardiovascular status: blood pressure returned to baseline and stable Postop Assessment: no apparent nausea or vomiting Anesthetic complications: no     Last Vitals:  Vitals:   07/20/19 0949 07/20/19 0952  BP: 108/67   Pulse: 78   Resp: (!) 22   Temp: (P) 36.6 C 36.6 C  SpO2: 93%     Last Pain:  Vitals:   07/20/19 1100  TempSrc:   PainSc: 0-No pain                 Durenda Hurt

## 2019-07-21 LAB — CBC
HCT: 42.4 % (ref 39.0–52.0)
Hemoglobin: 14.2 g/dL (ref 13.0–17.0)
MCH: 28.9 pg (ref 26.0–34.0)
MCHC: 33.5 g/dL (ref 30.0–36.0)
MCV: 86.4 fL (ref 80.0–100.0)
Platelets: 298 10*3/uL (ref 150–400)
RBC: 4.91 MIL/uL (ref 4.22–5.81)
RDW: 12.9 % (ref 11.5–15.5)
WBC: 15.2 10*3/uL — ABNORMAL HIGH (ref 4.0–10.5)
nRBC: 0 % (ref 0.0–0.2)

## 2019-07-21 LAB — COMPREHENSIVE METABOLIC PANEL
ALT: 36 U/L (ref 0–44)
AST: 26 U/L (ref 15–41)
Albumin: 2.8 g/dL — ABNORMAL LOW (ref 3.5–5.0)
Alkaline Phosphatase: 70 U/L (ref 38–126)
Anion gap: 11 (ref 5–15)
BUN: 32 mg/dL — ABNORMAL HIGH (ref 8–23)
CO2: 24 mmol/L (ref 22–32)
Calcium: 8.4 mg/dL — ABNORMAL LOW (ref 8.9–10.3)
Chloride: 103 mmol/L (ref 98–111)
Creatinine, Ser: 1.05 mg/dL (ref 0.61–1.24)
GFR calc Af Amer: >60 mL/min (ref >60)
GFR calc non Af Amer: >60 mL/min (ref >60)
Glucose, Bld: 114 mg/dL — ABNORMAL HIGH (ref 70–99)
Potassium: 4 mmol/L (ref 3.5–5.1)
Sodium: 138 mmol/L (ref 135–145)
Total Bilirubin: 0.9 mg/dL (ref 0.3–1.2)
Total Protein: 6.3 g/dL — ABNORMAL LOW (ref 6.5–8.1)

## 2019-07-21 LAB — SURGICAL PATHOLOGY

## 2019-07-21 MED ORDER — LORATADINE 10 MG PO TABS: 10.0000 mg | ORAL_TABLET | Freq: Every day | ORAL | Status: DC

## 2019-07-21 MED ORDER — HYDROCODONE-ACETAMINOPHEN 5-325 MG PO TABS: 1.0000 | ORAL_TABLET | ORAL | 0 refills | Status: DC | PRN

## 2019-07-21 MED ORDER — SIMVASTATIN 40 MG PO TABS
40.0000 mg | ORAL_TABLET | Freq: Every evening | ORAL | Status: DC
  Filled 2019-07-21 (×2): qty 1

## 2019-07-21 MED ORDER — POLYETHYLENE GLYCOL 3350 17 G PO PACK: 17.0000 g | PACK | Freq: Every day | ORAL | Status: DC

## 2019-07-21 MED ORDER — FAMOTIDINE 20 MG PO TABS
20.0000 mg | ORAL_TABLET | Freq: Two times a day (BID) | ORAL | Status: DC
  Administered 2019-07-21: 20 mg via ORAL
  Filled 2019-07-21: qty 1

## 2019-07-21 MED ORDER — CLOPIDOGREL BISULFATE 75 MG PO TABS: 75.0000 mg | ORAL_TABLET | Freq: Every day | ORAL | Status: DC

## 2019-07-21 MED ORDER — SUCRALFATE 1 G PO TABS
1.0000 g | ORAL_TABLET | Freq: Four times a day (QID) | ORAL | Status: DC
  Administered 2019-07-21: 14:00:00 1 g via ORAL
  Filled 2019-07-21: qty 1

## 2019-07-21 MED ORDER — IBUPROFEN 800 MG PO TABS: 800.0000 mg | ORAL_TABLET | Freq: Three times a day (TID) | ORAL | 0 refills | Status: DC | PRN

## 2019-07-21 MED ORDER — METRONIDAZOLE 500 MG PO TABS: 500.0000 mg | ORAL_TABLET | Freq: Three times a day (TID) | ORAL | 0 refills | Status: AC

## 2019-07-21 MED ORDER — ASPIRIN EC 81 MG PO TBEC: 81.0000 mg | DELAYED_RELEASE_TABLET | Freq: Every day | ORAL | Status: DC

## 2019-07-21 MED ORDER — CIPROFLOXACIN HCL 500 MG PO TABS: 500.0000 mg | ORAL_TABLET | Freq: Two times a day (BID) | ORAL | 0 refills | Status: AC

## 2019-07-21 NOTE — Progress Notes (Signed)
Kirby Funk to be D/C'd Home per MD order.  Discussed prescriptions and follow up appointments with the patient. Prescriptions given to patient, medication list explained in detail. Pt verbalized understanding.  Allergies as of 07/21/2019      Reactions   Penicillins Rash   Sulfa Antibiotics Rash      Medication List    TAKE these medications   Allegra Allergy 180 MG tablet Generic drug: fexofenadine Take 180 mg by mouth daily.   aspirin 81 MG tablet Take 81 mg by mouth at bedtime.   azelastine 0.1 % nasal spray Commonly known as: ASTELIN Place 1 spray into both nostrils 2 (two) times daily.   ciprofloxacin 500 MG tablet Commonly known as: Cipro Take 1 tablet (500 mg total) by mouth 2 (two) times daily for 10 days.   clopidogrel 75 MG tablet Commonly known as: PLAVIX Take 75 mg by mouth at bedtime.   famotidine 20 MG tablet Commonly known as: PEPCID Take 1 tablet (20 mg total) by mouth 2 (two) times daily for 7 days.   Fish Oil 1200 MG Caps Take 1,200 mg by mouth 2 (two) times daily.   HYDROcodone-acetaminophen 5-325 MG tablet Commonly known as: NORCO/VICODIN Take 1-2 tablets by mouth every 4 (four) hours as needed for moderate pain.   ibuprofen 800 MG tablet Commonly known as: ADVIL Take 1 tablet (800 mg total) by mouth every 8 (eight) hours as needed.   latanoprost 0.005 % ophthalmic solution Commonly known as: XALATAN Place 1 drop into both eyes at bedtime.   levETIRAcetam 500 MG tablet Commonly known as: KEPPRA Take 500 mg by mouth daily.   Magnesium 400 MG Caps Take 400 mg by mouth at bedtime.   metroNIDAZOLE 500 MG tablet Commonly known as: Flagyl Take 1 tablet (500 mg total) by mouth 3 (three) times daily for 10 days.   multivitamin tablet Take 1 tablet by mouth daily.   omeprazole 20 MG capsule Commonly known as: PRILOSEC TAKE 1 CAPSULE TWICE DAILY BEFORE MEALS What changed: See the new instructions.   polyethylene glycol 17 g  packet Commonly known as: MIRALAX / GLYCOLAX Take 17 g by mouth at bedtime.   Saw Palmetto 450 MG Caps Take 450 mg by mouth daily.   simvastatin 40 MG tablet Commonly known as: ZOCOR Take 40 mg by mouth every evening.   sucralfate 1 g tablet Commonly known as: Carafate Take 1 tablet (1 g total) by mouth 4 (four) times daily.   TART CHERRY ADVANCED PO Take 500 mg by mouth every morning.   Vitamin D3 50 MCG (2000 UT) Tabs Take 2,000 Units by mouth daily.       Vitals:   07/21/19 0410 07/21/19 1221  BP: (!) 153/89 (!) 146/94  Pulse: 75 91  Resp: 20 18  Temp: 98.5 F (36.9 C) 98.9 F (37.2 C)  SpO2: 92% 93%    Skin clean, dry and intact without evidence of skin break down, no evidence of skin tears noted. IV catheter discontinued intact. Site without signs and symptoms of complications. Dressing and pressure applied. Pt denies pain at this time. No complaints noted.  An After Visit Summary was printed and given to the patient. Patient escorted via [place], and D/C home via private auto.  Fuller Mandril, RN

## 2019-07-21 NOTE — Discharge Summary (Signed)
[facility] SURGICAL DISCHARGE SUMMARY  Patient ID: Edwin [name] MRN: [id number] DOB/AGE: [birth year] 78 y.o.  Admit date: 07/18/2019 Discharge date: 07/21/2019  Discharge Diagnoses Patient Active Problem List   Diagnosis Date Noted  . Acute gangrenous cholecystitis 07/19/2019    Consultants None  Procedures 07/19/2019:  Robotic assisted laparoscopic choelcystectomy  HPI: 78 y.o. male presented to [facility] ED originally on 11/14 for intermittent RUQ abdominal pain. He underwent work up and [place] that revealed gallbladder wall thickening but his pain improved with medications and he was discharged home. Since then he continues to notice worsening RUQ and right sided abdominal pain. Described as a sharp pain. No radiation. Exacerbated with inspiration and movement. No relief at this time. He endorses associated decreased PO intake and nausea. No yellowing of his eyes or skin, itching,  fever, chills, cough, congestion, CP, or SOB, or bowel/urinary changes. He does note a history of similar pain in the past which onset with eating fatty meals. However these pains always spontaneously resolved. Previous abdominal surgeries consistent for bilateral inguinal hernia repairs. Of note, he has a history of CAD s/p PCI in 2015 and is on Plavix but he last took this on 11/12. Work up in the ED was concerning for leukocytosis to 18K, mild hyperbilirubinemia, and distended gallbladder with surrounding inflammatory changes although is without cholelithiasis.      Hospital Course: Informed consent was obtained and documented, and patient underwent uneventful robotic assisted laparoscopic cholecystectomy (Dr Christian Mate, 09/17/2018).  Post-operatively, patient's pain and leukocytosis resolved and improved over the course of two post-op days. Advancement of patient's diet and ambulation were well-tolerated. The remainder of patient's hospital course was essentially unremarkable, and discharge  planning was initiated accordingly with patient safely able to be discharged home with appropriate discharge instructions, antibiotics (cipro + flagyl x 10 days), pain control, and outpatient follow-up after all of his questions were answered to his expressed satisfaction.   Discharge Condition: Good   Physical Examination:  Constitutional: alert, cooperative and no distress  Respiratory: breathing non-labored at rest  Cardiovascular: regular rate and sinus rhythm  Gastrointestinal: soft, incisional soreness, and non-distended. Blake drain in RUQ with serosanguinous output (REMOVED BEFORE D/C) Integumentary: laparoscopic incisions are CDI with dermabond, no erythema or drainage    Allergies as of 07/21/2019      Reactions   Penicillins Rash   Sulfa Antibiotics Rash      Medication List    TAKE these medications   Allegra Allergy 180 MG tablet Generic drug: fexofenadine Take 180 mg by mouth daily.   aspirin 81 MG tablet Take 81 mg by mouth at bedtime.   azelastine 0.1 % nasal spray Commonly known as: ASTELIN Place 1 spray into both nostrils 2 (two) times daily.   ciprofloxacin 500 MG tablet Commonly known as: Cipro Take 1 tablet (500 mg total) by mouth 2 (two) times daily for 10 days.   clopidogrel 75 MG tablet Commonly known as: PLAVIX Take 75 mg by mouth at bedtime.   famotidine 20 MG tablet Commonly known as: PEPCID Take 1 tablet (20 mg total) by mouth 2 (two) times daily for 7 days.   Fish Oil 1200 MG Caps Take 1,200 mg by mouth 2 (two) times daily.   HYDROcodone-acetaminophen 5-325 MG tablet Commonly known as: NORCO/VICODIN Take 1-2 tablets by mouth every 4 (four) hours as needed for moderate pain.   ibuprofen 800 MG tablet Commonly known as: ADVIL Take 1 tablet (800 mg total) by mouth every 8 (eight)  hours as needed.   latanoprost 0.005 % ophthalmic solution Commonly known as: XALATAN Place 1 drop into both eyes at bedtime.   levETIRAcetam 500 MG  tablet Commonly known as: KEPPRA Take 500 mg by mouth daily.   Magnesium 400 MG Caps Take 400 mg by mouth at bedtime.   metroNIDAZOLE 500 MG tablet Commonly known as: Flagyl Take 1 tablet (500 mg total) by mouth 3 (three) times daily for 10 days.   multivitamin tablet Take 1 tablet by mouth daily.   omeprazole 20 MG capsule Commonly known as: PRILOSEC TAKE 1 CAPSULE TWICE DAILY BEFORE MEALS What changed: See the new instructions.   polyethylene glycol 17 g packet Commonly known as: MIRALAX / GLYCOLAX Take 17 g by mouth at bedtime.   Saw Palmetto 450 MG Caps Take 450 mg by mouth daily.   simvastatin 40 MG tablet Commonly known as: ZOCOR Take 40 mg by mouth every evening.   sucralfate 1 g tablet Commonly known as: Carafate Take 1 tablet (1 g total) by mouth 4 (four) times daily.   TART CHERRY ADVANCED PO Take 500 mg by mouth every morning.   Vitamin D3 50 MCG (2000 UT) Tabs Take 2,000 Units by mouth daily.        Follow-up Information    Ronny Bacon, MD. Schedule an appointment as soon as possible for a visit in 1 week(s).   Specialty: Surgery Why: 1-2 weeks follow up with Dr Christian Mate, okay after Thanksgiving Contact information: [address] [place] [address] [phone]            Time spent on discharge management including discussion of hospital course, clinical condition, outpatient instructions, prescriptions, and follow up with the patient and members of the medical team: >30 minutes  -- Edison Simon , PA-C [facility]  07/21/2019, 10:34 AM [phone] M-F: 7am - 4pm

## 2019-07-21 NOTE — Care Management Important Message (Signed)
Important Message  Patient Details  Name: Edwin [name] MRN: [id number] Date of Birth: 04-28-1941   Medicare Important Message Given:  Yes     Dannette Barbara 07/21/2019, 1:30 PM

## 2019-07-21 NOTE — Discharge Instructions (Signed)
In addition to included general post-operative instructions for cholecystectomy,  Diet: Resume home diet. You may notice diarrhea with fatty meals for the first few days after surgery.    Activity: No heavy lifting >20 pounds (children, pets, laundry, garbage) for about 4 weeks, but light activity and walking are encouraged. Do not drive or drink alcohol if taking narcotic pain medications or having pain that might distract from driving.  Wound care: You may shower/get incision wet with soapy water and pat dry (do not rub incisions), but no baths or submerging incision underwater until follow-up.   Medications: Resume all home medications including Plavix and Miralax. For mild to moderate pain: acetaminophen (Tylenol) or ibuprofen/naproxen (if no kidney disease). Combining Tylenol with alcohol can substantially increase your risk of causing liver disease. Narcotic pain medications, if prescribed, can be used for severe pain, though may cause nausea, constipation, and drowsiness. Do not combine Tylenol and Percocet (or similar) within a 6 hour period as Percocet (and similar) contain(s) Tylenol. If you do not need the narcotic pain medication, you do not need to fill the prescription.  Call office [phone] / [phone]) at any time if any questions, worsening pain, fevers/chills, bleeding, drainage from incision site, or other concerns.

## 2019-07-26 ENCOUNTER — Encounter: Payer: Self-pay | Admitting: Physician Assistant

## 2019-07-26 ENCOUNTER — Other Ambulatory Visit: Payer: Self-pay

## 2019-07-26 ENCOUNTER — Encounter: Payer: Medicare HMO | Admitting: Surgery

## 2019-07-26 ENCOUNTER — Ambulatory Visit (INDEPENDENT_AMBULATORY_CARE_PROVIDER_SITE_OTHER): Payer: Medicare HMO | Admitting: Physician Assistant

## 2019-07-26 VITALS — BP 123/79 | HR 103 | Temp 97.9°F | Resp 20 | Ht 68.0 in | Wt 167.4 lb

## 2019-07-26 DIAGNOSIS — K810 Acute cholecystitis: Secondary | ICD-10-CM

## 2019-07-26 DIAGNOSIS — Z09 Encounter for follow-up examination after completed treatment for conditions other than malignant neoplasm: Secondary | ICD-10-CM

## 2019-07-26 NOTE — Progress Notes (Signed)
[facility] POST-OP OFFICE VISIT  07/26/2019  HPI: Edwin [name] is a 78 y.o. male 7 days s/p robotic assisted laparoscopic cholecystectomy for acute gangrenous cholecystitis with Dr Christian Mate. He did go home with blake drain in RUQ.   Today, he reports that he is slowly improving. He notes he biggest issue is weakness and mild lower extremity swelling since the surgery. He otherwise reports that his pain is vastly improved, no nausea or emesis. He has tolerated PO intake well and has had normal to improved bowel function. JP has been serous to serosanguinous and less than 100 mls a day. No other issues.   Vital signs: There were no vitals taken for this visit.   Physical Exam: Constitutional: Well appearing male, NAD Abdomen: Soft, non-tender, non-distended, no rebound or guarding. JP in RUQ with serosanguinous output (REMOVED) Skin: Laparoscopic incisions are CDI with dermabond, no erythema or drianage  Assessment/Plan: This is a 78 y.o. male  7 days s/p robotic assisted laparoscopic cholecystectomy   - pain control prn  - encouraged elevation of legs +/- compression stockings for swelling   - removed JP drain in clinic, placed occlusive dressing  - reviewed wound care and post op instructions  - Reviewed pathology: Acute necrotizing cholecystitis, negative for malignancy  - rtc  2 weeks  -- Edison Simon, PA-C [facility] 07/26/2019, 10:35 AM [phone] M-F: 7am - 4pm

## 2019-08-01 ENCOUNTER — Other Ambulatory Visit: Payer: Self-pay

## 2019-08-01 NOTE — Patient Outreach (Signed)
[facility]) Care Management  08/01/2019  Edwin [name] Apr 27, 1941 [id number]   EMMI- General Discharge RED ON EMMI ALERT Day # 4 Date:  07/27/2019 Red Alert Reason:  Lost interest in things? Yes   Outreach attempt: spoke with patient. He states he is doing ok. He states that his recovery is slower than he expected but did follow up with physician and he felt patient was doing good.  Discussed recovery and being patient.  He denies any other problems and declines any further questions or needs.   Plan: RN CM will close case.  Jone Baseman, RN, MSN [facility] Care Management Care Management Coordinator Direct Line [phone] Toll Free: [phone]  Fax: [phone]

## 2019-08-02 ENCOUNTER — Encounter: Payer: Medicare HMO | Admitting: Surgery

## 2019-08-04 ENCOUNTER — Other Ambulatory Visit: Payer: Self-pay

## 2019-08-08 ENCOUNTER — Other Ambulatory Visit (INDEPENDENT_AMBULATORY_CARE_PROVIDER_SITE_OTHER): Payer: Medicare HMO

## 2019-08-08 ENCOUNTER — Other Ambulatory Visit: Payer: Self-pay

## 2019-08-08 ENCOUNTER — Telehealth: Payer: Self-pay

## 2019-08-08 DIAGNOSIS — R739 Hyperglycemia, unspecified: Secondary | ICD-10-CM

## 2019-08-08 DIAGNOSIS — D0439 Carcinoma in situ of skin of other parts of face: Secondary | ICD-10-CM | POA: Diagnosis not present

## 2019-08-08 DIAGNOSIS — Z125 Encounter for screening for malignant neoplasm of prostate: Secondary | ICD-10-CM | POA: Diagnosis not present

## 2019-08-08 DIAGNOSIS — E7800 Pure hypercholesterolemia, unspecified: Secondary | ICD-10-CM

## 2019-08-08 DIAGNOSIS — C4442 Squamous cell carcinoma of skin of scalp and neck: Secondary | ICD-10-CM | POA: Diagnosis not present

## 2019-08-08 LAB — CBC WITH DIFFERENTIAL/PLATELET
Basophils Absolute: 0.1 10*3/uL (ref 0.0–0.1)
Basophils Relative: 1.1 % (ref 0.0–3.0)
Eosinophils Absolute: 0.1 10*3/uL (ref 0.0–0.7)
Eosinophils Relative: 1.2 % (ref 0.0–5.0)
HCT: 43 % (ref 39.0–52.0)
Hemoglobin: 14.4 g/dL (ref 13.0–17.0)
Lymphocytes Relative: 24.5 % (ref 12.0–46.0)
Lymphs Abs: 1.2 10*3/uL (ref 0.7–4.0)
MCHC: 33.6 g/dL (ref 30.0–36.0)
MCV: 86 fl (ref 78.0–100.0)
Monocytes Absolute: 0.5 10*3/uL (ref 0.1–1.0)
Monocytes Relative: 9.5 % (ref 3.0–12.0)
Neutro Abs: 3.2 10*3/uL (ref 1.4–7.7)
Neutrophils Relative %: 63.7 % (ref 43.0–77.0)
Platelets: 414 10*3/uL — ABNORMAL HIGH (ref 150.0–400.0)
RBC: 4.99 Mil/uL (ref 4.22–5.81)
RDW: 14.2 % (ref 11.5–15.5)
WBC: 5.1 10*3/uL (ref 4.0–10.5)

## 2019-08-08 LAB — TSH: TSH: 0.66 u[IU]/mL (ref 0.35–4.50)

## 2019-08-08 LAB — LIPID PANEL
Cholesterol: 156 mg/dL (ref 0–200)
HDL: 40.9 mg/dL (ref >39.00)
LDL Cholesterol: 90 mg/dL (ref 0–99)
NonHDL: 115.51
Total CHOL/HDL Ratio: 4
Triglycerides: 127 mg/dL (ref 0.0–149.0)
VLDL: 25.4 mg/dL (ref 0.0–40.0)

## 2019-08-08 LAB — BASIC METABOLIC PANEL
BUN: 8 mg/dL (ref 6–23)
CO2: 26 mEq/L (ref 19–32)
Calcium: 9.6 mg/dL (ref 8.4–10.5)
Chloride: 103 mEq/L (ref 96–112)
Creatinine, Ser: 0.89 mg/dL (ref 0.40–1.50)
GFR: 82.64 mL/min (ref >60.00)
Glucose, Bld: 106 mg/dL — ABNORMAL HIGH (ref 70–99)
Potassium: 3.7 mEq/L (ref 3.5–5.1)
Sodium: 138 mEq/L (ref 135–145)

## 2019-08-08 LAB — HEPATIC FUNCTION PANEL
ALT: 23 U/L (ref 0–53)
AST: 24 U/L (ref 0–37)
Albumin: 3.8 g/dL (ref 3.5–5.2)
Alkaline Phosphatase: 48 U/L (ref 39–117)
Bilirubin, Direct: 0.2 mg/dL (ref 0.0–0.3)
Total Bilirubin: 1.1 mg/dL (ref 0.2–1.2)
Total Protein: 6.7 g/dL (ref 6.0–8.3)

## 2019-08-08 LAB — HEMOGLOBIN A1C: Hgb A1c MFr Bld: 5.4 % (ref 4.6–6.5)

## 2019-08-08 NOTE — Addendum Note (Signed)
Addended by: Elpidio Galea T on: 08/08/2019 02:32 PM   Modules accepted: Orders

## 2019-08-08 NOTE — Telephone Encounter (Signed)
I called Edwin [name] to request he RTC so that the lab could draw his blood (again) today in order to collect a yellow top SS tube for his PSA. [name] obliged and should be by shortly.

## 2019-08-09 ENCOUNTER — Ambulatory Visit (INDEPENDENT_AMBULATORY_CARE_PROVIDER_SITE_OTHER): Payer: Medicare HMO | Admitting: Physician Assistant

## 2019-08-09 ENCOUNTER — Encounter: Payer: Self-pay | Admitting: Physician Assistant

## 2019-08-09 ENCOUNTER — Other Ambulatory Visit: Payer: Self-pay

## 2019-08-09 VITALS — BP 133/74 | HR 76 | Temp 97.7°F | Ht 68.0 in | Wt 158.8 lb

## 2019-08-09 DIAGNOSIS — Z09 Encounter for follow-up examination after completed treatment for conditions other than malignant neoplasm: Secondary | ICD-10-CM

## 2019-08-09 DIAGNOSIS — K810 Acute cholecystitis: Secondary | ICD-10-CM

## 2019-08-09 LAB — PSA, MEDICARE: PSA: 1.37 ng/ml (ref 0.10–4.00)

## 2019-08-09 NOTE — Progress Notes (Signed)
[facility] POST-OP OFFICE VISIT  08/09/2019  HPI: Edwin [name] is a 78 y.o. male ~3 weeks s/p robotic assisted laparoscopic cholecystectomy with Dr Christian Mate for gangrenous cholecystitis.  Today, he reports that he is overall doing well. No abdominal pain, fever, or chills. No nausea or emesis. Normal bowel function.   His wife is concerned regarding increased level of anxiety recently and has become obsessive over things which is new for him. He is due to follow up with his PCP this week and will address this.   Vital signs: BP 133/74   Pulse 76   Temp 97.7 F (36.5 C) (Temporal)   Ht 5\' 8"  (1.727 m)   Wt 158 lb 12.8 oz (72 kg)   SpO2 97%   BMI 24.15 kg/m    Physical Exam: Constitutional: Well appearing male, NAD Abdomen: Soft, non-tender, non-distended, no rebound/guarding Skin: Laparoscopic incisions are healing well, no erythema, no drainage  Assessment/Plan: This is a 78 y.o. male  ~3 weeks s/p robotic assisted laparoscopic cholecystectomy for gangrenous cholecystitis.     - pain control prn  - no evidence of wound infection  - complete 1 more week of lifting restrictions  - reviewed wound care and post-op instructions  - rtc prn  -- Edison Simon, PA-C [facility] 08/09/2019, 11:05 AM [phone] M-F: 7am - 4pm

## 2019-08-09 NOTE — Patient Instructions (Addendum)
Patient may drink Ensure to help with nutrients.  Patient is recommended to see his PCP to discuss anxiety issues. Follow-up with our office as needed. Please call and ask to speak with a nurse if you develop questions or concerns. Patient may refrain from any heavy lifting of no more than 10-15 lbs for one more week.   Laparoscopic Cholecystectomy Laparoscopic cholecystectomy is surgery to remove the gallbladder. The gallbladder is a pear-shaped organ that lies beneath the liver on the right side of the body. The gallbladder stores bile, which is a fluid that helps the body to digest fats. Cholecystectomy is often done for inflammation of the gallbladder (cholecystitis). This condition is usually caused by a buildup of gallstones (cholelithiasis) in the gallbladder. Gallstones can block the flow of bile, which can result in inflammation and pain. In severe cases, emergency surgery may be required. This procedure is done though small incisions in your abdomen (laparoscopic surgery). A thin scope with a camera (laparoscope) is inserted through one incision. Thin surgical instruments are inserted through the other incisions. In some cases, a laparoscopic procedure may be turned into a type of surgery that is done through a larger incision (open surgery). Tell a health care provider about:  Any allergies you have.  All medicines you are taking, including vitamins, herbs, eye drops, creams, and over-the-counter medicines.  Any problems you or family members have had with anesthetic medicines.  Any blood disorders you have.  Any surgeries you have had.  Any medical conditions you have.  Whether you are pregnant or may be pregnant. What are the risks? Generally, this is a safe procedure. However, problems may occur, including:  Infection.  Bleeding.  Allergic reactions to medicines.  Damage to other structures or organs.  A stone remaining in the common bile duct. The common bile duct  carries bile from the gallbladder into the small intestine.  A bile leak from the cyst duct that is clipped when your gallbladder is removed. What happens before the procedure? Staying hydrated Follow instructions from your health care provider about hydration, which may include:  Up to 2 hours before the procedure - you may continue to drink clear liquids, such as water, clear fruit juice, black coffee, and plain tea. Eating and drinking restrictions Follow instructions from your health care provider about eating and drinking, which may include:  8 hours before the procedure - stop eating heavy meals or foods such as meat, fried foods, or fatty foods.  6 hours before the procedure - stop eating light meals or foods, such as toast or cereal.  6 hours before the procedure - stop drinking milk or drinks that contain milk.  2 hours before the procedure - stop drinking clear liquids. Medicines  Ask your health care provider about: ? Changing or stopping your regular medicines. This is especially important if you are taking diabetes medicines or blood thinners. ? Taking medicines such as aspirin and ibuprofen. These medicines can thin your blood. Do not take these medicines before your procedure if your health care provider instructs you not to.  You may be given antibiotic medicine to help prevent infection. General instructions  Let your health care provider know if you develop a cold or an infection before surgery.  Plan to have someone take you home from the hospital or clinic.  Ask your health care provider how your surgical site will be marked or identified. What happens during the procedure?   To reduce your risk of infection: ?  Your health care team will wash or sanitize their hands. ? Your skin will be washed with soap. ? Hair may be removed from the surgical area.  An IV tube may be inserted into one of your veins.  You will be given one or more of the following: ? A  medicine to help you relax (sedative). ? A medicine to make you fall asleep (general anesthetic).  A breathing tube will be placed in your mouth.  Your surgeon will make several small cuts (incisions) in your abdomen.  The laparoscope will be inserted through one of the small incisions. The camera on the laparoscope will send images to a TV screen (monitor) in the operating room. This lets your surgeon see inside your abdomen.  Air-like gas will be pumped into your abdomen. This will expand your abdomen to give the surgeon more room to perform the surgery.  Other tools that are needed for the procedure will be inserted through the other incisions. The gallbladder will be removed through one of the incisions.  Your common bile duct may be examined. If stones are found in the common bile duct, they may be removed.  After your gallbladder has been removed, the incisions will be closed with stitches (sutures), staples, or skin glue.  Your incisions may be covered with a bandage (dressing). The procedure may vary among health care providers and hospitals. What happens after the procedure?  Your blood pressure, heart rate, breathing rate, and blood oxygen level will be monitored until the medicines you were given have worn off.  You will be given medicines as needed to control your pain.  Do not drive for 24 hours if you were given a sedative. This information is not intended to replace advice given to you by your health care provider. Make sure you discuss any questions you have with your health care provider. Document Released: 08/18/2005 Document Revised: 07/31/2017 Document Reviewed: 02/04/2016 Elsevier Patient Education  2020 [employer].

## 2019-08-10 ENCOUNTER — Ambulatory Visit (INDEPENDENT_AMBULATORY_CARE_PROVIDER_SITE_OTHER): Payer: Medicare HMO | Admitting: Internal Medicine

## 2019-08-10 ENCOUNTER — Encounter: Payer: Self-pay | Admitting: Internal Medicine

## 2019-08-10 ENCOUNTER — Other Ambulatory Visit: Payer: Self-pay

## 2019-08-10 DIAGNOSIS — R739 Hyperglycemia, unspecified: Secondary | ICD-10-CM

## 2019-08-10 DIAGNOSIS — G40909 Epilepsy, unspecified, not intractable, without status epilepticus: Secondary | ICD-10-CM | POA: Diagnosis not present

## 2019-08-10 DIAGNOSIS — I2510 Atherosclerotic heart disease of native coronary artery without angina pectoris: Secondary | ICD-10-CM | POA: Diagnosis not present

## 2019-08-10 DIAGNOSIS — E7800 Pure hypercholesterolemia, unspecified: Secondary | ICD-10-CM | POA: Diagnosis not present

## 2019-08-10 DIAGNOSIS — I10 Essential (primary) hypertension: Secondary | ICD-10-CM | POA: Diagnosis not present

## 2019-08-10 DIAGNOSIS — K810 Acute cholecystitis: Secondary | ICD-10-CM

## 2019-08-10 DIAGNOSIS — F419 Anxiety disorder, unspecified: Secondary | ICD-10-CM | POA: Diagnosis not present

## 2019-08-10 DIAGNOSIS — Z9109 Other allergy status, other than to drugs and biological substances: Secondary | ICD-10-CM | POA: Diagnosis not present

## 2019-08-10 DIAGNOSIS — K219 Gastro-esophageal reflux disease without esophagitis: Secondary | ICD-10-CM

## 2019-08-10 MED ORDER — MIRTAZAPINE 15 MG PO TABS: ORAL_TABLET | ORAL | 1 refills | Status: DC

## 2019-08-10 NOTE — Assessment & Plan Note (Signed)
On simvastatin.  Discussed recent labs.  Follow lipid panel and liver function tests.   Lab Results  Component Value Date   CHOL 156 08/08/2019   HDL 40.90 08/08/2019   LDLCALC 90 08/08/2019   LDLDIRECT 99.1 07/29/2013   TRIG 127.0 08/08/2019   CHOLHDL 4 08/08/2019

## 2019-08-10 NOTE — Progress Notes (Addendum)
Patient ID: Edwin [name], male   DOB: 03/25/1941, 78 y.o.   MRN: [id number]   Virtual Visit via telephone Note This visit occurred during the SARS-CoV-2 public health emergency.  Safety protocols were in place, including screening questions prior to the visit, additional usage of staff PPE, and extensive cleaning of exam room while observing appropriate contact time as indicated for disinfecting solutions.  This visit type was conducted due to national recommendations for restrictions regarding the COVID-19 pandemic (e.g. social distancing).  This format is felt to be most appropriate for this patient at this time.  All issues noted in this document were discussed and addressed.  No physical exam was performed (except for noted visual exam findings with Video Visits).   I connected with Morio Ringwald by telephone and verified that I am speaking with the correct person using two identifiers. Location patient: home Location provider: work  Persons participating in the virtual visit: patient, provider and pts wife  I discussed the limitations, risks, security and privacy concerns of performing an evaluation and management service by telephone and the availability of in person appointments.  The patient expressed understanding and agreed to proceed.   Reason for visit: scheduled follow up  HPI: He was recently admitted and is s/p lap cholecystectomy for gangrenous cholecystitis.  He is having no residual abdominal pain.  Decreased appetite.  No nausea or vomiting.  Describes what he calls constipation.  On questioning him and his wife, he is having bowel movements.  Has had three this am, but he reports is not a large amount.  No blood.  No abdominal pain or bloating.  Taking miralax.  Also takes metamucil.  States he was given a pain medication prior to his CT scan.  Reports he remembers the scan and reports hearing screaming and yelling.  Also states his hand looked distorted to him and it  looked like everything was made of plastic.  Reports he remembers them telling him to keep his arms by his side during the scan.  Relates all of these memories as a side effect to the pain medication.  States after being home for approximately one week, he dreamed the whole CT experience again.  States was not a pleasant experience.  He and his wife report since being home from the hospital, he has been worrying more.  Not sleeping.  Worrying about paying bills, changing air filters and "trivial things".  Does report increased anxiety and some depression.  Reports no chest pain or sob.  Wife noted on a couple of checks - pulse rate in 50s.  This did occur while resting.  States in hospital noticed some irregular beats.  In reviewing, it appears - SR with PACs on  EKG.    ROS: See pertinent positives and negatives per HPI.  Past Medical History:  Diagnosis Date  . Allergy   . Arthritis   . Coronary artery disease   . Diverticulosis of colon   . GERD (gastroesophageal reflux disease)    ESOPHAGEAL REFLUX  . Glaucoma   . Gout   . Hemorrhoids   . History of kidney stones   . Hx of colonic polyp   . Hypercholesterolemia   . Hypertension   . Schatzki's ring   . Seizures (HCC)     Past Surgical History:  Procedure Laterality Date  . CARDIAC CATHETERIZATION    . COLONOSCOPY WITH PROPOFOL N/A 05/18/2017   Procedure: COLONOSCOPY WITH PROPOFOL;  Surgeon: Elliott, Robert T, MD;  Location:   [facility] ENDOSCOPY;  Service: Endoscopy;  Laterality: N/A;  . CORONARY ANGIOPLASTY    . EYE SURGERY  07/20/13   cataract  . HERNIA REPAIR Right 2008 & 2012  . VASECTOMY  1984    Family History  Problem Relation Age of Onset  . Arthritis Mother   . Heart disease Mother   . Stroke Mother   . Hypertension Mother   . Arthritis Father   . Hyperlipidemia Father   . Heart disease Father   . Hypertension Father   . Parkinson's disease Father   . Multiple sclerosis Sister   . Colon cancer Maternal Grandmother    . Arthritis Paternal Grandmother   . Colon cancer Paternal Grandmother     SOCIAL HX: reviewed.    Current Outpatient Medications:  .  aspirin 81 MG tablet, Take 81 mg by mouth at bedtime., Disp: , Rfl:  .  azelastine (ASTELIN) 0.1 % nasal spray, Place 1 spray into both nostrils 2 (two) times daily., Disp: 90 mL, Rfl: 1 .  Cholecalciferol (VITAMIN D3) 2000 UNITS TABS, Take 2,000 Units by mouth daily. , Disp: , Rfl:  .  clopidogrel (PLAVIX) 75 MG tablet, Take 75 mg by mouth at bedtime. , Disp: , Rfl:  .  fexofenadine (ALLEGRA ALLERGY) 180 MG tablet, Take 180 mg by mouth daily., Disp: , Rfl:  .  ibuprofen (ADVIL) 800 MG tablet, Take 1 tablet (800 mg total) by mouth every 8 (eight) hours as needed., Disp: 30 tablet, Rfl: 0 .  latanoprost (XALATAN) 0.005 % ophthalmic solution, Place 1 drop into both eyes at bedtime., Disp: , Rfl:  .  levETIRAcetam (KEPPRA) 500 MG tablet, Take 500 mg by mouth daily. , Disp: , Rfl:  .  Misc Natural Products (TART CHERRY ADVANCED PO), Take 500 mg by mouth every morning., Disp: , Rfl:  .  Multiple Vitamin (MULTIVITAMIN) tablet, Take 1 tablet by mouth daily., Disp: , Rfl:  .  Omega-3 Fatty Acids (FISH OIL) 1200 MG CAPS, Take 1,200 mg by mouth 2 (two) times daily. , Disp: , Rfl:  .  omeprazole (PRILOSEC) 20 MG capsule, TAKE 1 CAPSULE TWICE DAILY BEFORE MEALS (Patient taking differently: Take 20 mg by mouth 2 (two) times daily before a meal. ), Disp: 180 capsule, Rfl: 3 .  Saw Palmetto 450 MG CAPS, Take 450 mg by mouth daily. , Disp: , Rfl:  .  simvastatin (ZOCOR) 40 MG tablet, Take 40 mg by mouth every evening. , Disp: , Rfl:  .  mirtazapine (REMERON) 15 MG tablet, Take 1/2 tablet q hs, Disp: 15 tablet, Rfl: 1  EXAM:  GENERAL: alert.  Sounds to be in no acute distress.  Answering questions appropriately.    HEENT: atraumatic, conjunttiva clear, no obvious abnormalities on inspection of external nose and ears  PSYCH/NEURO: pleasant and cooperative, no obvious  depression or anxiety, speech and thought processing grossly intact  ASSESSMENT AND PLAN:  Discussed the following assessment and plan:  Acute gangrenous cholecystitis S/p lap cholecystectomy.  Had recent f/u with surgery.  Felt doing well from surgical standpoint.  Still with some decreased appetite, but he is eating. No abdominal pain, nausea or vomiting.  Continue miralax and metamucil.  Stay hydrated.    CAD (coronary artery disease) Followed by cardiology.  Continue risk factor modification.  Currently without chest pain.   Environmental allergies Stable.   Essential hypertension, benign Blood pressure has been under good control.  Follow pressures.  Follow metabolic panel.    GERD (gastroesophageal reflux   disease) Controlled.  No report of increased acid reflux.   Hypercholesterolemia On simvastatin.  Discussed recent labs.  Follow lipid panel and liver function tests.   Lab Results  Component Value Date   CHOL 156 08/08/2019   HDL 40.90 08/08/2019   LDLCALC 90 08/08/2019   LDLDIRECT 99.1 07/29/2013   TRIG 127.0 08/08/2019   CHOLHDL 4 08/08/2019    Hyperglycemia Follow met b and a1c.   Lab Results  Component Value Date   HGBA1C 5.4 08/08/2019    Seizure disorder Stable on keppra.   Anxiety Reports increased anxiety and not sleeping.  Also, had reaction to a pain medication while in hospital.  Was hallucinating.  Had a dream - where he relived the whole event.  This was disturbing to him.  Has not dreamed about this again, but is having problems sleeping - specifically staying asleep.  States he is worrying about things - wife reports trivial things.  Can't relax.  States he feels he cannot control his thought processes.  Discussed treatment options.  Discuss with psychiatry.    Addendum:  Discussed with psychiatry.  Start remeron 15mg (1/2 tablet) per day.  Notified.  Will send in rx.     I discussed the assessment and treatment plan with the patient. The  patient was provided an opportunity to ask questions and all were answered. The patient agreed with the plan and demonstrated an understanding of the instructions.   The patient was advised to call back or seek an in-person evaluation if the symptoms worsen or if the condition fails to improve as anticipated.  I provided 35 minutes of non-face-to-face time during this encounter.    , MD  

## 2019-08-10 NOTE — Assessment & Plan Note (Addendum)
Reports increased anxiety and not sleeping.  Also, had reaction to a pain medication while in hospital.  Was hallucinating.  Had a dream - where he relived the whole event.  This was disturbing to him.  Has not dreamed about this again, but is having problems sleeping - specifically staying asleep.  States he is worrying about things - wife reports trivial things.  Can't relax.  States he feels he cannot control his thought processes.  Discussed treatment options.  Discuss with psychiatry.    Addendum:  Discussed with psychiatry.  Start remeron 15mg  (1/2 tablet) per day.  Notified.  Will send in rx.

## 2019-08-10 NOTE — Assessment & Plan Note (Signed)
Controlled.  No report of increased acid reflux.

## 2019-08-10 NOTE — Assessment & Plan Note (Signed)
Followed by cardiology.  Continue risk factor modification.  Currently without chest pain.

## 2019-08-10 NOTE — Assessment & Plan Note (Signed)
Stable

## 2019-08-10 NOTE — Assessment & Plan Note (Signed)
S/p lap cholecystectomy.  Had recent f/u with surgery.  Felt doing well from surgical standpoint.  Still with some decreased appetite, but he is eating. No abdominal pain, nausea or vomiting.  Continue miralax and metamucil.  Stay hydrated.

## 2019-08-10 NOTE — Assessment & Plan Note (Signed)
Follow met b and a1c.   Lab Results  Component Value Date   HGBA1C 5.4 08/08/2019

## 2019-08-10 NOTE — Addendum Note (Signed)
Addended by: Alisa Graff on: 08/10/2019 09:22 PM   Modules accepted: Orders

## 2019-08-10 NOTE — Assessment & Plan Note (Signed)
Blood pressure has been under good control.  Follow pressures.  Follow metabolic panel.  

## 2019-08-10 NOTE — Assessment & Plan Note (Signed)
Stable on keppra.

## 2019-08-12 ENCOUNTER — Emergency Department: Payer: Medicare HMO

## 2019-08-12 ENCOUNTER — Other Ambulatory Visit: Payer: Self-pay

## 2019-08-12 ENCOUNTER — Encounter: Payer: Self-pay | Admitting: Emergency Medicine

## 2019-08-12 ENCOUNTER — Emergency Department
Admission: EM | Admit: 2019-08-12 | Discharge: 2019-08-12 | Disposition: A | Discharge status: Home / Self Care | Payer: Medicare HMO | Attending: Emergency Medicine | Admitting: Emergency Medicine

## 2019-08-12 ENCOUNTER — Ambulatory Visit: Payer: Self-pay | Admitting: *Deleted

## 2019-08-12 DIAGNOSIS — R569 Unspecified convulsions: Secondary | ICD-10-CM | POA: Diagnosis not present

## 2019-08-12 DIAGNOSIS — I10 Essential (primary) hypertension: Secondary | ICD-10-CM | POA: Diagnosis not present

## 2019-08-12 DIAGNOSIS — Z85828 Personal history of other malignant neoplasm of skin: Secondary | ICD-10-CM | POA: Diagnosis not present

## 2019-08-12 DIAGNOSIS — R4182 Altered mental status, unspecified: Secondary | ICD-10-CM | POA: Diagnosis present

## 2019-08-12 DIAGNOSIS — F419 Anxiety disorder, unspecified: Secondary | ICD-10-CM | POA: Diagnosis not present

## 2019-08-12 DIAGNOSIS — R000 Tachycardia, unspecified: Secondary | ICD-10-CM | POA: Diagnosis not present

## 2019-08-12 DIAGNOSIS — I2510 Atherosclerotic heart disease of native coronary artery without angina pectoris: Secondary | ICD-10-CM | POA: Diagnosis not present

## 2019-08-12 DIAGNOSIS — G9340 Encephalopathy, unspecified: Secondary | ICD-10-CM | POA: Diagnosis not present

## 2019-08-12 DIAGNOSIS — I4510 Unspecified right bundle-branch block: Secondary | ICD-10-CM | POA: Diagnosis not present

## 2019-08-12 DIAGNOSIS — G40909 Epilepsy, unspecified, not intractable, without status epilepticus: Secondary | ICD-10-CM | POA: Diagnosis not present

## 2019-08-12 LAB — CBC WITH DIFFERENTIAL/PLATELET
Abs Immature Granulocytes: 0.02 10*3/uL (ref 0.00–0.07)
Basophils Absolute: 0 10*3/uL (ref 0.0–0.1)
Basophils Relative: 1 %
Eosinophils Absolute: 0.1 10*3/uL (ref 0.0–0.5)
Eosinophils Relative: 1 %
HCT: 43.2 % (ref 39.0–52.0)
Hemoglobin: 14.8 g/dL (ref 13.0–17.0)
Immature Granulocytes: 0 %
Lymphocytes Relative: 24 %
Lymphs Abs: 1.3 10*3/uL (ref 0.7–4.0)
MCH: 28.6 pg (ref 26.0–34.0)
MCHC: 34.3 g/dL (ref 30.0–36.0)
MCV: 83.6 fL (ref 80.0–100.0)
Monocytes Absolute: 0.5 10*3/uL (ref 0.1–1.0)
Monocytes Relative: 10 %
Neutro Abs: 3.5 10*3/uL (ref 1.7–7.7)
Neutrophils Relative %: 64 %
Platelets: 317 10*3/uL (ref 150–400)
RBC: 5.17 MIL/uL (ref 4.22–5.81)
RDW: 13.3 % (ref 11.5–15.5)
WBC: 5.4 10*3/uL (ref 4.0–10.5)
nRBC: 0 % (ref 0.0–0.2)

## 2019-08-12 LAB — TROPONIN I (HIGH SENSITIVITY): Troponin I (High Sensitivity): 35 ng/L — ABNORMAL HIGH (ref <18)

## 2019-08-12 LAB — COMPREHENSIVE METABOLIC PANEL
ALT: 24 U/L (ref 0–44)
AST: 28 U/L (ref 15–41)
Albumin: 3.9 g/dL (ref 3.5–5.0)
Alkaline Phosphatase: 50 U/L (ref 38–126)
Anion gap: 9 (ref 5–15)
BUN: 11 mg/dL (ref 8–23)
CO2: 25 mmol/L (ref 22–32)
Calcium: 9.3 mg/dL (ref 8.9–10.3)
Chloride: 105 mmol/L (ref 98–111)
Creatinine, Ser: 1 mg/dL (ref 0.61–1.24)
GFR calc Af Amer: >60 mL/min (ref >60)
GFR calc non Af Amer: >60 mL/min (ref >60)
Glucose, Bld: 122 mg/dL — ABNORMAL HIGH (ref 70–99)
Potassium: 3.8 mmol/L (ref 3.5–5.1)
Sodium: 139 mmol/L (ref 135–145)
Total Bilirubin: 1.4 mg/dL — ABNORMAL HIGH (ref 0.3–1.2)
Total Protein: 7.3 g/dL (ref 6.5–8.1)

## 2019-08-12 MED ORDER — LEVETIRACETAM 500 MG PO TABS: 500.0000 mg | ORAL_TABLET | Freq: Two times a day (BID) | ORAL | 2 refills | Status: DC

## 2019-08-12 MED ORDER — LORAZEPAM 0.5 MG PO TABS: 0.5000 mg | ORAL_TABLET | Freq: Two times a day (BID) | ORAL | 0 refills | Status: DC

## 2019-08-12 MED ORDER — LORAZEPAM 2 MG/ML IJ SOLN
0.5000 mg | Freq: Once | INTRAMUSCULAR | Status: AC
  Administered 2019-08-12: 0.5 mg via INTRAVENOUS

## 2019-08-12 MED ORDER — LORAZEPAM 2 MG/ML IJ SOLN
INTRAMUSCULAR | Status: AC
  Filled 2019-08-12: qty 1

## 2019-08-12 MED ORDER — LEVETIRACETAM IN NACL 1000 MG/100ML IV SOLN
1000.0000 mg | Freq: Once | INTRAVENOUS | Status: AC
  Administered 2019-08-12: 1000 mg via INTRAVENOUS
  Filled 2019-08-12: qty 100

## 2019-08-12 NOTE — Telephone Encounter (Signed)
Agree with ER evaluation

## 2019-08-12 NOTE — ED Triage Notes (Signed)
Pt via EMS from home. Pt states that EMS was called this am. Per EMS, pt wife states that pt had a "blank stare" and was unresponsive for approx 8 mins. Pt has a hx of seizures and takes Keppra for it. Per wife, pt was prescribed an anxiety medication yesterday that she thinks made it worse.

## 2019-08-12 NOTE — Telephone Encounter (Signed)
Patient took first dose of antidepressive medication last night. Patient woke this morning- showered, breakfast- he had episode of trembling, blank stare- 4-5 minutes. EMT called- and assessed. BP and O2 level was fine. ( no EKG done) They offered to take him to ED- but patient did not want to go. After they left- he had another episode- blank stare ahead- and unresponsive for 4-5 minutes. Patient recovers and is back to normal- some unsteadiness on feet. Note: Last night patient- had some confusion during eye drops for glaucoma- this is not normal and this was before first does of new anti-depressive medication.  Advised ED for evaluation- or contact Neurologist for emergency appointment due to patient history- but warned they may advise the same -ED.  Will send message to PCP- and see if she wants to hold the new medication until patient is evaluated or if she wants patient to continue.   Reason for Disposition . [1] Weakness (i.e., paralysis, loss of muscle strength) of the face, arm / hand, or leg / foot on one side of the body AND [2] sudden onset AND [3] brief (now gone)  Answer Assessment - Initial Assessment Questions 1. SYMPTOM: "What is the main symptom you are concerned about?" (e.g., weakness, numbness)     Blank stare and unresponsive- 4-5 minutes 2. ONSET: "When did this start?" (minutes, hours, days; while sleeping)     This morning he has had 2 episodes 3. LAST NORMAL: "When was the last time you were normal (no symptoms)?"     yesterday 4. PATTERN "Does this come and go, or has it been constant since it started?"  "Is it present now?"     Comes and goes 5. CARDIAC SYMPTOMS: "Have you had any of the following symptoms: chest pain, difficulty breathing, palpitations?"     no 6. NEUROLOGIC SYMPTOMS: "Have you had any of the following symptoms: headache, dizziness, vision loss, double vision, changes in speech, unsteady on your feet?"     Unsteady on feet prior to both  episodes 7. OTHER SYMPTOMS: "Do you have any other symptoms?"     no 8. PREGNANCY: "Is there any chance you are pregnant?" "When was your last menstrual period?"     n/a  Protocols used: NEUROLOGIC DEFICIT-A-AH

## 2019-08-12 NOTE — ED Notes (Signed)
Called to bedside by wife.  Pt with nonfocused stare, arms and legs twitching.  Pt looks in direction of wife's voice, but does not follow commands.  Dr. Jimmye Norman notified and to bedside to assess pt.

## 2019-08-12 NOTE — Consult Note (Signed)
[facility] Consult   Reason for Consult: Medications Referring Physician: Dr. Jimmye Norman Patient Identification: Edwin [name] MRN:  [id number] Principal Diagnosis: <principal problem not specified> Diagnosis:  Active Problems:   * No active hospital problems. *   Total Time spent with patient: 30 minutes  Subjective:   Edwin [name] is a 78 y.o. male patient admitted with seizure-like activity.  HPI: Patient is a 78 year old male who was admitted with seizure-like activity.  Patient had recent surgery approximately 2 weeks ago and since that time has not been feeling himself.  He reports increase in anxiety and was seen by his PCP and prescribed Remeron to help with his sleep and anxiety.  Upon taking his pill this morning patient experienced staring spells and was thought to have a seizure.  Presented to the ED where he was cleared medically but psychiatry was consulted for medication management. Patient denies any suicidal or homicidal ideation.  Denies any manic episodes.  Denies ever seeing a psychiatrist prior.  Patient has never taken psychiatric medications aside from this 1 dose prescribed by his primary care doctor.  Patient has been having significant anxiety and trouble sleeping over the past 2 weeks as reported by wife who is at bedside.   Past Psychiatric History: Denies Risk to Self:  No Risk to Others:  No Prior Inpatient Therapy:  No Prior Outpatient Therapy:  No  Past Medical History:  Past Medical History:  Diagnosis Date  . Allergy   . Arthritis   . Coronary artery disease   . Diverticulosis of colon   . GERD (gastroesophageal reflux disease)    ESOPHAGEAL REFLUX  . Glaucoma   . Gout   . Hemorrhoids   . History of kidney stones   . Hx of colonic polyp   . Hypercholesterolemia   . Hypertension   . Schatzki's ring   . Seizures ([place])     Past Surgical History:  Procedure Laterality Date  . CARDIAC CATHETERIZATION    . COLONOSCOPY  WITH PROPOFOL N/A 05/18/2017   Procedure: COLONOSCOPY WITH PROPOFOL;  Surgeon: Manya Silvas, MD;  Location: [facility] ENDOSCOPY;  Service: Endoscopy;  Laterality: N/A;  . CORONARY ANGIOPLASTY    . EYE SURGERY  07/20/13   cataract  . HERNIA REPAIR Right 2008 & 2012  . VASECTOMY  1984   Family History:  Family History  Problem Relation Age of Onset  . Arthritis Mother   . Heart disease Mother   . Stroke Mother   . Hypertension Mother   . Arthritis Father   . Hyperlipidemia Father   . Heart disease Father   . Hypertension Father   . Parkinson's disease Father   . Multiple sclerosis Sister   . Colon cancer Maternal Grandmother   . Arthritis Paternal Grandmother   . Colon cancer Paternal Grandmother    Family Psychiatric  History: Denies  social History:  Social History   Substance and Sexual Activity  Alcohol Use No  . Alcohol/week: 0.0 standard drinks     Social History   Substance and Sexual Activity  Drug Use No    Social History   Socioeconomic History  . Marital status: Married    Spouse name: Not on file  . Number of children: Not on file  . Years of education: Not on file  . Highest education level: Not on file  Occupational History  . Not on file  Tobacco Use  . Smoking status: Never Smoker  . Smokeless tobacco: Never  Used  Substance and Sexual Activity  . Alcohol use: No    Alcohol/week: 0.0 standard drinks  . Drug use: No  . Sexual activity: Never  Other Topics Concern  . Not on file  Social History Narrative  . Not on file   Social Determinants of Health   Financial Resource Strain:   . Difficulty of Paying Living Expenses: Not on file  Food Insecurity:   . Worried About Charity fundraiser in the Last Year: Not on file  . Ran Out of Food in the Last Year: Not on file  Transportation Needs:   . Lack of Transportation (Medical): Not on file  . Lack of Transportation (Non-Medical): Not on file  Physical Activity:   . Days of Exercise per  Week: Not on file  . Minutes of Exercise per Session: Not on file  Stress:   . Feeling of Stress : Not on file  Social Connections:   . Frequency of Communication with Friends and Family: Not on file  . Frequency of Social Gatherings with Friends and Family: Not on file  . Attends Religious Services: Not on file  . Active Member of Clubs or Organizations: Not on file  . Attends Archivist Meetings: Not on file  . Marital Status: Not on file   Additional Social History:    Allergies:   Allergies  Allergen Reactions  . Penicillins Rash  . Sulfa Antibiotics Rash    Labs:  Results for orders placed or performed during the hospital encounter of 08/12/19 (from the past 48 hour(s))  CBC with Differential     Status: None   Collection Time: 08/12/19 12:29 PM  Result Value Ref Range   WBC 5.4 4.0 - 10.5 K/uL   RBC 5.17 4.22 - 5.81 MIL/uL   Hemoglobin 14.8 13.0 - 17.0 g/dL   HCT 43.2 39.0 - 52.0 %   MCV 83.6 80.0 - 100.0 fL   MCH 28.6 26.0 - 34.0 pg   MCHC 34.3 30.0 - 36.0 g/dL   RDW 13.3 11.5 - 15.5 %   Platelets 317 150 - 400 K/uL   nRBC 0.0 0.0 - 0.2 %   Neutrophils Relative % 64 %   Neutro Abs 3.5 1.7 - 7.7 K/uL   Lymphocytes Relative 24 %   Lymphs Abs 1.3 0.7 - 4.0 K/uL   Monocytes Relative 10 %   Monocytes Absolute 0.5 0.1 - 1.0 K/uL   Eosinophils Relative 1 %   Eosinophils Absolute 0.1 0.0 - 0.5 K/uL   Basophils Relative 1 %   Basophils Absolute 0.0 0.0 - 0.1 K/uL   Immature Granulocytes 0 %   Abs Immature Granulocytes 0.02 0.00 - 0.07 K/uL    Comment: Performed at [facility], [address] [place], [place] [address]  Comprehensive metabolic panel     Status: Abnormal   Collection Time: 08/12/19 12:29 PM  Result Value Ref Range   Sodium 139 135 - 145 mmol/L   Potassium 3.8 3.5 - 5.1 mmol/L   Chloride 105 98 - 111 mmol/L   CO2 25 22 - 32 mmol/L   Glucose, Bld 122 (H) 70 - 99 mg/dL   BUN 11 8 - 23 mg/dL   Creatinine, Ser 1.00 0.61 - 1.24  mg/dL   Calcium 9.3 8.9 - 10.3 mg/dL   Total Protein 7.3 6.5 - 8.1 g/dL   Albumin 3.9 3.5 - 5.0 g/dL   AST 28 15 - 41 U/L   ALT 24 0 -  44 U/L   Alkaline Phosphatase 50 38 - 126 U/L   Total Bilirubin 1.4 (H) 0.3 - 1.2 mg/dL   GFR calc non Af Amer >60 >60 mL/min   GFR calc Af Amer >60 >60 mL/min   Anion gap 9 5 - 15    Comment: Performed at [facility], [place], [place] [address]  Troponin I (High Sensitivity)     Status: Abnormal   Collection Time: 08/12/19 12:29 PM  Result Value Ref Range   Troponin I (High Sensitivity) 35 (H) <18 ng/L    Comment: (NOTE) Elevated high sensitivity troponin I (hsTnI) values and significant  changes across serial measurements may suggest ACS but many other  chronic and acute conditions are known to elevate hsTnI results.  Refer to the "Links" section for chest pain algorithms and additional  guidance. Performed at [facility], [address] [place], [place] [address]     No current facility-administered medications for this encounter.   Current Outpatient Medications  Medication Sig Dispense Refill  . aspirin 81 MG tablet Take 81 mg by mouth at bedtime.    Marland Kitchen azelastine (ASTELIN) 0.1 % nasal spray Place 1 spray into both nostrils 2 (two) times daily. 90 mL 1  . Cholecalciferol (VITAMIN D3) 2000 UNITS TABS Take 2,000 Units by mouth daily.     . clopidogrel (PLAVIX) 75 MG tablet Take 75 mg by mouth at bedtime.     . fexofenadine (ALLEGRA ALLERGY) 180 MG tablet Take 180 mg by mouth daily.    Marland Kitchen ibuprofen (ADVIL) 800 MG tablet Take 1 tablet (800 mg total) by mouth every 8 (eight) hours as needed. 30 tablet 0  . latanoprost (XALATAN) 0.005 % ophthalmic solution Place 1 drop into both eyes at bedtime.    . levETIRAcetam (KEPPRA) 500 MG tablet Take 1 tablet (500 mg total) by mouth 2 (two) times daily. 60 tablet 2  . LORazepam (ATIVAN) 0.5 MG tablet Take 1 tablet (0.5 mg total) by mouth 2 (two) times daily. 30 tablet  0  . mirtazapine (REMERON) 15 MG tablet Take 1/2 tablet q hs 15 tablet 1  . Misc Natural Products (TART CHERRY ADVANCED PO) Take 500 mg by mouth every morning.    . Multiple Vitamin (MULTIVITAMIN) tablet Take 1 tablet by mouth daily.    . Omega-3 Fatty Acids (FISH OIL) 1200 MG CAPS Take 1,200 mg by mouth 2 (two) times daily.     Marland Kitchen omeprazole (PRILOSEC) 20 MG capsule TAKE 1 CAPSULE TWICE DAILY BEFORE MEALS (Patient taking differently: Take 20 mg by mouth 2 (two) times daily before a meal. ) 180 capsule 3  . Saw Palmetto 450 MG CAPS Take 450 mg by mouth daily.     . simvastatin (ZOCOR) 40 MG tablet Take 40 mg by mouth every evening.       Musculoskeletal: Strength & Muscle Tone: decreased Gait & Station: normal Patient leans: N/A  Psychiatric Specialty Exam: Physical Exam  Review of Systems  Psychiatric/Behavioral: Positive for sleep disturbance. Negative for agitation, behavioral problems, confusion, decreased concentration, dysphoric mood, hallucinations, self-injury and suicidal ideas. The patient is nervous/anxious. The patient is not hyperactive.     Blood pressure 109/83, pulse 69, temperature 98.3 F (36.8 C), temperature source Oral, resp. rate 16, height 5\' 8"  (1.727 m), weight 71.7 kg, SpO2 99 %.Body mass index is 24.02 kg/m.  General Appearance: Fairly Groomed  Eye Contact:  Good  Speech:  Clear and Coherent  Volume:  Normal  Mood:  Anxious  Affect:  Congruent  Thought Process:  Coherent  Orientation:  Full (Time, Place, and Person)  Thought Content:  Logical  Suicidal Thoughts:  No  Homicidal Thoughts:  No  Memory:  Immediate;   Good  Judgement:  Intact  Insight:  Fair  Psychomotor Activity:  Normal  Concentration:  Concentration: Good  Recall:  Good  Fund of Knowledge:  Fair  Language:  Fair  Akathisia:  Negative  Handed:  Right  AIMS (if indicated):     Assets:  Communication Skills Desire for Improvement Housing Intimacy Leisure Time Resilience Social  Support  ADL's:  Intact  Cognition:  WNL  Sleep:        Treatment Plan Summary: 78 year old male presenting with seizure-like activity after taking medication for anxiety.  Patient still with significant anxiety will benefit from additional medications at this time.  Patient does not require inpatient hospitalization.  Medications: Psychiatry will recommend prescribing the patient lorazepam 0.5 mg twice daily with follow-up with outpatient PCP or psychiatric services  Disposition: No evidence of imminent risk to self or others at present.   Patient does not meet criteria for psychiatric inpatient admission. Supportive therapy provided about ongoing stressors. Discussed crisis plan, support from social network, calling 911, coming to the Emergency Department, and calling Suicide Hotline.  Dixie Dials, MD 08/12/2019 6:32 PM

## 2019-08-12 NOTE — ED Provider Notes (Signed)
[facility] Emergency Department Provider Note       Time seen: ----------------------------------------- 12:26 PM on 08/12/2019 ----------------------------------------- I have reviewed the triage vital signs and the nursing notes.  HISTORY   Chief Complaint Seizures   HPI Edwin [name] is a 78 y.o. male with a history of allergies, arthritis, coronary artery disease, hyperlipidemia, hypertension who presents to the ED for altered mental status.  Patient had a brief, perhaps less than 10-minute event where he was unresponsive.  He appeared to be staring out to outer space, has had similar happen approximately 2 years ago for which she was taking antiepileptic medications.  He did not bite his tongue, was not incontinent, did not have shaking during this event.  Recently was given Remeron for anxiety.  He denies complaints at this time and arrives alert and oriented.  Past Medical History:  Diagnosis Date  . Allergy   . Arthritis   . Coronary artery disease   . Diverticulosis of colon   . GERD (gastroesophageal reflux disease)    ESOPHAGEAL REFLUX  . Glaucoma   . Gout   . Hemorrhoids   . History of kidney stones   . Hx of colonic polyp   . Hypercholesterolemia   . Hypertension   . Schatzki's ring   . Seizures [facility])     Patient Active Problem List   Diagnosis Date Noted  . Anxiety 08/10/2019  . Acute gangrenous cholecystitis 07/19/2019  . Hyperglycemia 09/21/2017  . Decreased taste and smell 09/21/2017  . Health care maintenance 11/12/2014  . Fatigue 04/09/2014  . CAD (coronary artery disease) 12/15/2013  . Arthritis 07/31/2013  . Glaucoma 07/31/2013  . Environmental allergies 07/31/2013  . Hypercholesterolemia 07/31/2013  . Essential hypertension, benign 07/31/2013  . Kidney stones 07/31/2013  . History of colonic polyps 07/31/2013  . Seizure disorder ([place]) 07/31/2013  . GERD (gastroesophageal reflux disease) 07/31/2013  . Gout  07/31/2013    Past Surgical History:  Procedure Laterality Date  . CARDIAC CATHETERIZATION    . COLONOSCOPY WITH PROPOFOL N/A 05/18/2017   Procedure: COLONOSCOPY WITH PROPOFOL;  Surgeon: Manya Silvas, MD;  Location: [facility] ENDOSCOPY;  Service: Endoscopy;  Laterality: N/A;  . CORONARY ANGIOPLASTY    . EYE SURGERY  07/20/13   cataract  . HERNIA REPAIR Right 2008 & 2012  . VASECTOMY  1984    Allergies Penicillins and Sulfa antibiotics  Social History Social History   Tobacco Use  . Smoking status: Never Smoker  . Smokeless tobacco: Never Used  Substance Use Topics  . Alcohol use: No    Alcohol/week: 0.0 standard drinks  . Drug use: No    Review of Systems Constitutional: Negative for fever. Cardiovascular: Negative for chest pain. Respiratory: Negative for shortness of breath. Gastrointestinal: Negative for abdominal pain, vomiting and diarrhea. Musculoskeletal: Negative for back pain. Skin: Negative for rash. Neurological: Negative for headaches, focal weakness or numbness.  Positive for possible seizure  All systems negative/normal/unremarkable except as stated in the HPI  ____________________________________________ EKG: Interpreted by me, sinus rhythm, right bundle branch block, left anterior fascicular block, normal QT  PHYSICAL EXAM:  VITAL SIGNS: ED Triage Vitals  Enc Vitals Group     BP      Pulse      Resp      Temp      Temp src      SpO2      Weight      Height      Head  Circumference      Peak Flow      Pain Score      Pain Loc      Pain Edu?      Excl. in [place]?     Constitutional: Alert and oriented. Well appearing and in no distress. Eyes: Conjunctivae are normal. Normal extraocular movements. ENT      Head: Normocephalic, left temporal sutures from recent skin cancer removal appears clean dry and intact      Nose: No congestion/rhinnorhea.      Mouth/Throat: Mucous membranes are moist.      Neck: No stridor. Cardiovascular: Normal  rate, regular rhythm. No murmurs, rubs, or gallops. Respiratory: Normal respiratory effort without tachypnea nor retractions. Breath sounds are clear and equal bilaterally. No wheezes/rales/rhonchi. Gastrointestinal: Soft and nontender. Normal bowel sounds Musculoskeletal: Nontender with normal range of motion in extremities. No lower extremity tenderness nor edema. Neurologic:  Normal speech and language. No gross focal neurologic deficits are appreciated.  Skin:  Skin is warm, dry and intact. No rash noted. Psychiatric: Mood and affect are normal. Speech and behavior are normal.   ____________________________________________  ED COURSE:  As part of my medical decision making, I reviewed the following data within the [place] History obtained from family if available, nursing notes, old chart and ekg, as well as notes from prior ED visits. Patient presented for likely seizure, we will assess with labs and imaging as indicated at this time.   Procedures  Edwin [name] was evaluated in Emergency Department on 08/12/2019 for the symptoms described in the history of present illness. He was evaluated in the context of the global COVID-19 pandemic, which necessitated consideration that the patient might be at risk for infection with the SARS-CoV-2 virus that causes COVID-19. Institutional protocols and algorithms that pertain to the evaluation of patients at risk for COVID-19 are in a state of rapid change based on information released by regulatory bodies including the CDC and federal and state organizations. These policies and algorithms were followed during the patient's care in the ED.  ____________________________________________   LABS (pertinent positives/negatives)  Labs Reviewed  COMPREHENSIVE METABOLIC PANEL - Abnormal; Notable for the following components:      Result Value   Glucose, Bld 122 (*)    Total Bilirubin 1.4 (*)    All other components within normal  limits  TROPONIN I (HIGH SENSITIVITY) - Abnormal; Notable for the following components:   Troponin I (High Sensitivity) 35 (*)    All other components within normal limits  CBC WITH DIFFERENTIAL/PLATELET  URINALYSIS, COMPLETE (UACMP) WITH MICROSCOPIC  TROPONIN I (HIGH SENSITIVITY)   CRITICAL CARE Performed by: Laurence Aly   Total critical care time: 30 minutes  Critical care time was exclusive of separately billable procedures and treating other patients.  Critical care was necessary to treat or prevent imminent or life-threatening deterioration.  Critical care was time spent personally by me on the following activities: development of treatment plan with patient and/or surrogate as well as nursing, discussions with consultants, evaluation of patient's response to treatment, examination of patient, obtaining history from patient or surrogate, ordering and performing treatments and interventions, ordering and review of laboratory studies, ordering and review of radiographic studies, pulse oximetry and re-evaluation of patient's condition.  RADIOLOGY Images were viewed by me  CT head IMPRESSION:  1. No focal acute intracranial abnormality identified.  2. Chronic diffuse atrophy. Chronic bilateral periventricular white  matter small vessel ischemic change.  ____________________________________________  DIFFERENTIAL DIAGNOSIS   Seizure, medication side effect, arrhythmia, MI, CVA  FINAL ASSESSMENT AND PLAN  Focal seizures   Plan: The patient had presented for a likely seizure event. Patient's labs revealed a mildly elevated troponin which is likely demand related. Patient's imaging did not reveal any acute process.  He was observed to have seizure-like activity while in the hallway.  Patient was glancing around and disoriented, would not follow commands.  Family states this is what he was doing earlier.  I discussed with neurology, we will load him with IV Keppra and he  received IV Ativan.  He has been evaluated by neurology and psychiatry and is otherwise cleared for outpatient follow-up.   Laurence Aly, MD    Note: This note was generated in part or whole with voice recognition software. Voice recognition is usually quite accurate but there are transcription errors that can and very often do occur. I apologize for any typographical errors that were not detected and corrected.     Earleen Newport, MD 08/12/19 [id number]

## 2019-08-12 NOTE — Consult Note (Signed)
Reason for Consult:seizure activity  Referring Physician: Dr. Jimmye Norman   CC: seizure activity   HPI: Edwin [name] is an 78 y.o. male with prior history of seizures none recently.  Patent is s/p  robotic assisted laparoscopic cholecystectomy with Dr Christian Mate for gangrenous cholecystitis about 3 weeks ago. As per wife since that time he has been anxious, not sleeping well.  He was started on mirtazapine yesterday.  Today multiple blank episodes and generalized seizure. At baseline patient was on Keppra 500 daily and decreased from BID due to somnolence.  Currently back to baseline  Past Medical History:  Diagnosis Date  . Allergy   . Arthritis   . Coronary artery disease   . Diverticulosis of colon   . GERD (gastroesophageal reflux disease)    ESOPHAGEAL REFLUX  . Glaucoma   . Gout   . Hemorrhoids   . History of kidney stones   . Hx of colonic polyp   . Hypercholesterolemia   . Hypertension   . Schatzki's ring   . Seizures ([place])     Past Surgical History:  Procedure Laterality Date  . CARDIAC CATHETERIZATION    . COLONOSCOPY WITH PROPOFOL N/A 05/18/2017   Procedure: COLONOSCOPY WITH PROPOFOL;  Surgeon: Manya Silvas, MD;  Location: [facility] ENDOSCOPY;  Service: Endoscopy;  Laterality: N/A;  . CORONARY ANGIOPLASTY    . EYE SURGERY  07/20/13   cataract  . HERNIA REPAIR Right 2008 & 2012  . VASECTOMY  1984    Family History  Problem Relation Age of Onset  . Arthritis Mother   . Heart disease Mother   . Stroke Mother   . Hypertension Mother   . Arthritis Father   . Hyperlipidemia Father   . Heart disease Father   . Hypertension Father   . Parkinson's disease Father   . Multiple sclerosis Sister   . Colon cancer Maternal Grandmother   . Arthritis Paternal Grandmother   . Colon cancer Paternal Grandmother     Social History:  reports that he has never smoked. He has never used smokeless tobacco. He reports that he does not drink alcohol or use drugs.  Allergies   Allergen Reactions  . Penicillins Rash  . Sulfa Antibiotics Rash    Medications: I have reviewed the patient's current medications.  ROS: History obtained from the patient  General ROS: negative for - chills, fatigue, fever, night sweats, weight gain or weight loss Psychological ROS: negative for - behavioral disorder, hallucinations, memory difficulties, mood swings or suicidal ideation Ophthalmic ROS: negative for - blurry vision, double vision, eye pain or loss of vision ENT ROS: negative for - epistaxis, nasal discharge, oral lesions, sore throat, tinnitus or vertigo Allergy and Immunology ROS: negative for - hives or itchy/watery eyes Hematological and Lymphatic ROS: negative for - bleeding problems, bruising or swollen lymph nodes Endocrine ROS: negative for - galactorrhea, hair pattern changes, polydipsia/polyuria or temperature intolerance Respiratory ROS: negative for - cough, hemoptysis, shortness of breath or wheezing Cardiovascular ROS: negative for - chest pain, dyspnea on exertion, edema or irregular heartbeat Gastrointestinal ROS: negative for - abdominal pain, diarrhea, hematemesis, nausea/vomiting or stool incontinence Genito-Urinary ROS: negative for - dysuria, hematuria, incontinence or urinary frequency/urgency Musculoskeletal ROS: negative for - joint swelling or muscular weakness Neurological ROS: as noted in HPI Dermatological ROS: negative for rash and skin lesion changes  Physical Examination: Blood pressure (!) 136/95, pulse 91, temperature 98.3 F (36.8 C), temperature source Oral, resp. rate 18, height 5\' 8"  (1.727 m),  weight 71.7 kg, SpO2 100 %. Neurological Examination   Mental Status: Alert, oriented, thought content appropriate.  Speech fluent without evidence of aphasia.  Able to follow 3 step commands without difficulty. Cranial Nerves: II: Discs flat bilaterally; Visual fields grossly normal, pupils equal, round, reactive to light and  accommodation III,IV, VI: ptosis not present, extra-ocular motions intact bilaterally V,VII: smile symmetric, facial light touch sensation normal bilaterally VIII: hearing normal bilaterally IX,X: gag reflex present XI: bilateral shoulder shrug XII: midline tongue extension Motor: Right : Upper extremity   5/5    Left:     Upper extremity   5/5  Lower extremity   5/5     Lower extremity   5/5 Tone and bulk:normal tone throughout; no atrophy noted Sensory: Pinprick and light touch intact throughout, bilaterally Deep Tendon Reflexes: 1+ and symmetric throughout Plantars: Right: downgoing   Left: downgoing Cerebellar: normal finger-to-nose Gait: not tested      Laboratory Studies:   Basic Metabolic Panel: Recent Labs  Lab 08/08/19 0837 08/12/19 1229  NA 138 139  K 3.7 3.8  CL 103 105  CO2 26 25  GLUCOSE 106* 122*  BUN 8 11  CREATININE 0.89 1.00  CALCIUM 9.6 9.3    Liver Function Tests: Recent Labs  Lab 08/08/19 0837 08/12/19 1229  AST 24 28  ALT 23 24  ALKPHOS 48 50  BILITOT 1.1 1.4*  PROT 6.7 7.3  ALBUMIN 3.8 3.9   No results for input(s): LIPASE, AMYLASE in the last 168 hours. No results for input(s): AMMONIA in the last 168 hours.  CBC: Recent Labs  Lab 08/08/19 0837 08/12/19 1229  WBC 5.1 5.4  NEUTROABS 3.2 3.5  HGB 14.4 14.8  HCT 43.0 43.2  MCV 86.0 83.6  PLT 414.0* 317    Cardiac Enzymes: No results for input(s): CKTOTAL, CKMB, CKMBINDEX, TROPONINI in the last 168 hours.  BNP: Invalid input(s): POCBNP  CBG: No results for input(s): GLUCAP in the last 168 hours.  Microbiology: Results for orders placed or performed during the hospital encounter of 07/18/19  SARS Coronavirus 2 by RT PCR (hospital order, performed in [facility] hospital lab) Nasopharyngeal Nasopharyngeal Swab     Status: None   Collection Time: 07/18/19  2:56 PM   Specimen: Nasopharyngeal Swab  Result Value Ref Range Status   SARS Coronavirus 2 NEGATIVE NEGATIVE Final     Comment: (NOTE) If result is NEGATIVE SARS-CoV-2 target nucleic acids are NOT DETECTED. The SARS-CoV-2 RNA is generally detectable in upper and lower  respiratory specimens during the acute phase of infection. The lowest  concentration of SARS-CoV-2 viral copies this assay can detect is 250  copies / mL. A negative result does not preclude SARS-CoV-2 infection  and should not be used as the sole basis for treatment or other  patient management decisions.  A negative result may occur with  improper specimen collection / handling, submission of specimen other  than nasopharyngeal swab, presence of viral mutation(s) within the  areas targeted by this assay, and inadequate number of viral copies  (<250 copies / mL). A negative result must be combined with clinical  observations, patient history, and epidemiological information. If result is POSITIVE SARS-CoV-2 target nucleic acids are DETECTED. The SARS-CoV-2 RNA is generally detectable in upper and lower  respiratory specimens dur ing the acute phase of infection.  Positive  results are indicative of active infection with SARS-CoV-2.  Clinical  correlation with patient history and other diagnostic information is  necessary to  determine patient infection status.  Positive results do  not rule out bacterial infection or co-infection with other viruses. If result is PRESUMPTIVE POSTIVE SARS-CoV-2 nucleic acids MAY BE PRESENT.   A presumptive positive result was obtained on the submitted specimen  and confirmed on repeat testing.  While 2019 novel coronavirus  (SARS-CoV-2) nucleic acids may be present in the submitted sample  additional confirmatory testing may be necessary for epidemiological  and / or clinical management purposes  to differentiate between  SARS-CoV-2 and other Sarbecovirus currently known to infect humans.  If clinically indicated additional testing with an alternate test  methodology [phone]) is advised. The  SARS-CoV-2 RNA is generally  detectable in upper and lower respiratory sp ecimens during the acute  phase of infection. The expected result is Negative. Fact Sheet for Patients:  StrictlyIdeas.no Fact Sheet for Healthcare Providers: BankingDealers.co.za This test is not yet approved or cleared by the [place] FDA and has been authorized for detection and/or diagnosis of SARS-CoV-2 by FDA under an Emergency Use Authorization (EUA).  This EUA will remain in effect (meaning this test can be used) for the duration of the COVID-19 declaration under Section 564(b)(1) of the Act, 21 U.S.C. section 360bbb-3(b)(1), unless the authorization is terminated or revoked sooner. Performed at [facility], [address] [place], [place] [address]   Surgical PCR screen     Status: None   Collection Time: 07/19/19  6:34 AM   Specimen: Nasal Mucosa; Nasal Swab  Result Value Ref Range Status   MRSA, PCR NEGATIVE NEGATIVE Final   Staphylococcus aureus NEGATIVE NEGATIVE Final    Comment: (NOTE) The Xpert SA Assay (FDA approved for NASAL specimens in patients 28 years of age and older), is one component of a comprehensive surveillance program. It is not intended to diagnose infection nor to guide or monitor treatment. Performed at [facility], [address] [place], [place] [address]     Coagulation Studies: No results for input(s): LABPROT, INR in the last 72 hours.  Urinalysis: No results for input(s): COLORURINE, LABSPEC, PHURINE, GLUCOSEU, HGBUR, BILIRUBINUR, KETONESUR, PROTEINUR, UROBILINOGEN, NITRITE, LEUKOCYTESUR in the last 168 hours.  Invalid input(s): APPERANCEUR  Lipid Panel:     Component Value Date/Time   CHOL 156 08/08/2019 0837   TRIG 127.0 08/08/2019 0837   HDL 40.90 08/08/2019 0837   CHOLHDL 4 08/08/2019 0837   VLDL 25.4 08/08/2019 0837   LDLCALC 90 08/08/2019 0837    HgbA1C:  Lab Results   Component Value Date   HGBA1C 5.4 08/08/2019    Urine Drug Screen:  No results found for: LABOPIA, COCAINSCRNUR, LABBENZ, AMPHETMU, THCU, LABBARB  Alcohol Level: No results for input(s): ETH in the last 168 hours.  Other results: EKG: normal EKG, normal sinus rhythm, unchanged from previous tracings.  Imaging: CT Head Wo Contrast  Result Date: 08/12/2019 CLINICAL DATA:  Encephalopathy. EXAM: CT HEAD WITHOUT CONTRAST TECHNIQUE: Contiguous axial images were obtained from the base of the skull through the vertex without intravenous contrast. COMPARISON:  MR of head October 09, 2017 FINDINGS: Brain: No evidence of acute infarction, hemorrhage, hydrocephalus, extra-axial collection or mass lesion/mass effect. There is chronic diffuse atrophy. Chronic bilateral periventricular white matter small vessel ischemic changes identified. Small old lacunar infarction is identified in the left basal ganglia. Vascular: No hyperdense vessel is noted. Skull: Normal. Negative for fracture or focal lesion. Sinuses/Orbits: No acute finding. Other: None. IMPRESSION: 1. No focal acute intracranial abnormality identified. 2. Chronic diffuse atrophy. Chronic bilateral periventricular  white matter small vessel ischemic change. Electronically Signed   By: Abelardo Diesel M.D.   On: 08/12/2019 13:25     Assessment/Plan:  78 y.o. male with prior history of seizures none recently.  Patent is s/p  robotic assisted laparoscopic cholecystectomy with Dr Christian Mate for gangrenous cholecystitis about 3 weeks ago. As per wife since that time he has been anxious, not sleeping well.  He was started on mirtazapine yesterday.  Today multiple blank episodes and generalized seizure. At baseline patient was on Keppra 500 daily and decreased from BID due to somnolence.  Currently back to baseline  - Seizure likely provoked in setting of sleep deprivation/anxiety - I would increase Keppra 500 BID - possibly d/c today from ED.  - follow  up with Dr. Manuella Ghazi as out patient - Primary/psychiatry follow up for sleep deprivation/anxiety 08/12/2019, 2:09 PM

## 2019-08-12 NOTE — Telephone Encounter (Signed)
Called patient has had two more episodes and EMS there getting ready to transport to hospital.

## 2019-08-12 NOTE — ED Notes (Addendum)
Patient up to bathroom, tolerated well.  Support person remained with patient with RN outside restroom.  Felt slightly uneasy on feet when returning to stretcher.

## 2019-08-16 ENCOUNTER — Encounter: Payer: Self-pay | Admitting: Internal Medicine

## 2019-08-17 NOTE — Telephone Encounter (Signed)
See if agreeable to do a virtual ER follow up - Thursday 08/18/19.  Thanks

## 2019-08-17 NOTE — Telephone Encounter (Signed)
If you would like to do a virtual visit with him I can set it up I just wanted you to see this message. ED physician recommending you follow his lorazepam. Stopped remeron. Has f/u with Dr Manuella Ghazi on Friday.

## 2019-08-18 ENCOUNTER — Other Ambulatory Visit: Payer: Self-pay

## 2019-08-18 ENCOUNTER — Ambulatory Visit (INDEPENDENT_AMBULATORY_CARE_PROVIDER_SITE_OTHER): Payer: Medicare HMO | Admitting: Internal Medicine

## 2019-08-18 ENCOUNTER — Encounter: Payer: Self-pay | Admitting: Internal Medicine

## 2019-08-18 DIAGNOSIS — G40909 Epilepsy, unspecified, not intractable, without status epilepticus: Secondary | ICD-10-CM

## 2019-08-18 DIAGNOSIS — F419 Anxiety disorder, unspecified: Secondary | ICD-10-CM

## 2019-08-18 NOTE — Progress Notes (Signed)
Patient ID: Edwin [name], male   DOB: 09-09-1940, 78 y.o.   MRN: [id number]   Virtual Visit via video Note  This visit type was conducted due to national recommendations for restrictions regarding the COVID-19 pandemic (e.g. social distancing).  This format is felt to be most appropriate for this patient at this time.  All issues noted in this document were discussed and addressed.  No physical exam was performed (except for noted visual exam findings with Video Visits).   I connected with Edwin [name] by a video enabled telemedicine application and verified that I am speaking with the correct person using two identifiers. Location patient: home Location provider: work Persons participating in the virtual visit: patient, provider and pts wife  I discussed the limitations, risks, security and privacy concerns of performing an evaluation and management service by video and the availability of in person appointments.  The patient expressed understanding and agreed to proceed.   Reason for visit:  ER follow up.   HPI: Evaluated in ER 08/12/19 with altered mental status - felt to be related to seizures.  Has a history of seizures.  On keppra.  Imaging did not reveal any acute process.  Discussed with neurology.  Loaded with keppra and IV ativan was given.  Was evaluated by psychiatry and neurology.  Decided to discharge him on ativan.  Reports he is doing better.  Feels better.  Sleeping better.  Sleeps some during the day.  Discussed decreasing ativan in the am.  Will continue ativan for now.  Eating better.  No nausea or vomiting.  No abdominal pain.  Has noticed double vision - noticed in ER and intermittent since.  If he covers either eye - no double vision.  Has appt with neurology tomorrow.  Plans to discussed.  Is some better.  More steady.     ROS: See pertinent positives and negatives per HPI.  Past Medical History:  Diagnosis Date  . Allergy   . Arthritis   . Coronary artery  disease   . Diverticulosis of colon   . GERD (gastroesophageal reflux disease)    ESOPHAGEAL REFLUX  . Glaucoma   . Gout   . Hemorrhoids   . History of kidney stones   . Hx of colonic polyp   . Hypercholesterolemia   . Hypertension   . Schatzki's ring   . Seizures ([place])     Past Surgical History:  Procedure Laterality Date  . CARDIAC CATHETERIZATION    . COLONOSCOPY WITH PROPOFOL N/A 05/18/2017   Procedure: COLONOSCOPY WITH PROPOFOL;  Surgeon: Manya Silvas, MD;  Location: [facility] ENDOSCOPY;  Service: Endoscopy;  Laterality: N/A;  . CORONARY ANGIOPLASTY    . EYE SURGERY  07/20/13   cataract  . HERNIA REPAIR Right 2008 & 2012  . VASECTOMY  1984    Family History  Problem Relation Age of Onset  . Arthritis Mother   . Heart disease Mother   . Stroke Mother   . Hypertension Mother   . Arthritis Father   . Hyperlipidemia Father   . Heart disease Father   . Hypertension Father   . Parkinson's disease Father   . Multiple sclerosis Sister   . Colon cancer Maternal Grandmother   . Arthritis Paternal Grandmother   . Colon cancer Paternal Grandmother     SOCIAL HX: reviewed.    Current Outpatient Medications:  .  aspirin 81 MG tablet, Take 81 mg by mouth at bedtime., Disp: , Rfl:  .  azelastine (  ASTELIN) 0.1 % nasal spray, Place 1 spray into both nostrils 2 (two) times daily., Disp: 90 mL, Rfl: 1 .  Cholecalciferol (VITAMIN D3) 2000 UNITS TABS, Take 2,000 Units by mouth daily. , Disp: , Rfl:  .  clopidogrel (PLAVIX) 75 MG tablet, Take 75 mg by mouth at bedtime. , Disp: , Rfl:  .  fexofenadine (ALLEGRA ALLERGY) 180 MG tablet, Take 180 mg by mouth daily., Disp: , Rfl:  .  ibuprofen (ADVIL) 800 MG tablet, Take 1 tablet (800 mg total) by mouth every 8 (eight) hours as needed., Disp: 30 tablet, Rfl: 0 .  latanoprost (XALATAN) 0.005 % ophthalmic solution, Place 1 drop into both eyes at bedtime., Disp: , Rfl:  .  levETIRAcetam (KEPPRA) 500 MG tablet, Take 1 tablet (500 mg total)  by mouth 2 (two) times daily., Disp: 60 tablet, Rfl: 2 .  LORazepam (ATIVAN) 0.5 MG tablet, Take 1 tablet (0.5 mg total) by mouth 2 (two) times daily., Disp: 30 tablet, Rfl: 0 .  Misc Natural Products (TART CHERRY ADVANCED PO), Take 500 mg by mouth every morning., Disp: , Rfl:  .  Multiple Vitamin (MULTIVITAMIN) tablet, Take 1 tablet by mouth daily., Disp: , Rfl:  .  Omega-3 Fatty Acids (FISH OIL) 1200 MG CAPS, Take 1,200 mg by mouth 2 (two) times daily. , Disp: , Rfl:  .  omeprazole (PRILOSEC) 20 MG capsule, TAKE 1 CAPSULE TWICE DAILY BEFORE MEALS (Patient taking differently: Take 20 mg by mouth 2 (two) times daily before a meal. ), Disp: 180 capsule, Rfl: 3 .  Saw Palmetto 450 MG CAPS, Take 450 mg by mouth daily. , Disp: , Rfl:  .  simvastatin (ZOCOR) 40 MG tablet, Take 40 mg by mouth every evening. , Disp: , Rfl:   EXAM:  GENERAL: alert, oriented, appears well and in no acute distress  HEENT: atraumatic, conjunttiva clear, no obvious abnormalities on inspection of external nose and ears  NECK: normal movements of the head and neck  LUNGS: on inspection no signs of respiratory distress, breathing rate appears normal, no obvious gross SOB, gasping or wheezing  CV: no obvious cyanosis  PSYCH/NEURO: pleasant and cooperative, no obvious depression or anxiety, speech and thought processing grossly intact  ASSESSMENT AND PLAN:  Discussed the following assessment and plan:  Seizure disorder Recent ER evaluation for presumed seizures.  On keppra bid.  Dose increased.  On ativan.  Sleeping better.  Prior to seizures - sleep deprived.  No further seizure activity.  Planning to see neurology tomorrow.  Continue ativan for now.    Anxiety Has had issues with increased anxiety and sleeping difficulty since his recent admission.  Was started on remeron. Took one dose the night went to ER.  Now on ativan.  Sleeping better.  Since some sleep during the day, discussed decreasing ativan to 1/2 tablet  q am and continuing one tablet q hs.  Follow closely. Does feel better.      I discussed the assessment and treatment plan with the patient. The patient was provided an opportunity to ask questions and all were answered. The patient agreed with the plan and demonstrated an understanding of the instructions.   The patient was advised to call back or seek an in-person evaluation if the symptoms worsen or if the condition fails to improve as anticipated.   Einar Pheasant, MD

## 2019-08-19 ENCOUNTER — Telehealth: Payer: Self-pay

## 2019-08-19 DIAGNOSIS — R569 Unspecified convulsions: Secondary | ICD-10-CM | POA: Diagnosis not present

## 2019-08-19 NOTE — Telephone Encounter (Signed)
Thank you.  Please notify pt and let them know to let me know if any problems.

## 2019-08-19 NOTE — Telephone Encounter (Signed)
[place] to confirm rx from ED MD was received on 12/15. Rx was received and shipped out on 12/15. Expected delivery date is scheduled for 12/19.

## 2019-08-19 NOTE — Telephone Encounter (Signed)
Patient's wife is aware 

## 2019-08-21 ENCOUNTER — Encounter: Payer: Self-pay | Admitting: Internal Medicine

## 2019-08-21 NOTE — Assessment & Plan Note (Signed)
Has had issues with increased anxiety and sleeping difficulty since his recent admission.  Was started on remeron. Took one dose the night went to ER.  Now on ativan.  Sleeping better.  Since some sleep during the day, discussed decreasing ativan to 1/2 tablet q am and continuing one tablet q hs.  Follow closely. Does feel better.

## 2019-08-21 NOTE — Assessment & Plan Note (Signed)
Recent ER evaluation for presumed seizures.  On keppra bid.  Dose increased.  On ativan.  Sleeping better.  Prior to seizures - sleep deprived.  No further seizure activity.  Planning to see neurology tomorrow.  Continue ativan for now.

## 2019-09-01 ENCOUNTER — Encounter: Payer: Self-pay | Admitting: Internal Medicine

## 2019-09-05 ENCOUNTER — Other Ambulatory Visit: Payer: Self-pay

## 2019-09-05 NOTE — Telephone Encounter (Signed)
Called patients wife to get clarification on my chart message. Wife stated that patient has been taking 1/2 tablet of Lorazepam in the AM and 1 whole tablet qhs- tolerating well. They have 18 pills left. Requesting refill be sent to [facility].

## 2019-09-05 NOTE — Telephone Encounter (Signed)
See phone note sent to provider

## 2019-09-06 MED ORDER — LORAZEPAM 0.5 MG PO TABS: ORAL_TABLET | ORAL | 0 refills | Status: DC

## 2019-09-06 NOTE — Telephone Encounter (Signed)
I have sent in the prescription for lorazepam.  Regarding the questions about his other seizure medication (see her message) - make sure has heard back from neurology.  Neurology's last note has directions, but I want her to clarify with them about medication.

## 2019-09-07 DIAGNOSIS — E7800 Pure hypercholesterolemia, unspecified: Secondary | ICD-10-CM | POA: Diagnosis not present

## 2019-09-07 DIAGNOSIS — I2510 Atherosclerotic heart disease of native coronary artery without angina pectoris: Secondary | ICD-10-CM | POA: Diagnosis not present

## 2019-09-07 DIAGNOSIS — Z9861 Coronary angioplasty status: Secondary | ICD-10-CM | POA: Diagnosis not present

## 2019-09-07 DIAGNOSIS — I10 Essential (primary) hypertension: Secondary | ICD-10-CM | POA: Diagnosis not present

## 2019-09-09 DIAGNOSIS — R569 Unspecified convulsions: Secondary | ICD-10-CM | POA: Diagnosis not present

## 2019-09-09 DIAGNOSIS — G4733 Obstructive sleep apnea (adult) (pediatric): Secondary | ICD-10-CM | POA: Diagnosis not present

## 2019-09-09 DIAGNOSIS — G939 Disorder of brain, unspecified: Secondary | ICD-10-CM | POA: Diagnosis not present

## 2019-09-09 DIAGNOSIS — R259 Unspecified abnormal involuntary movements: Secondary | ICD-10-CM | POA: Diagnosis not present

## 2019-09-09 DIAGNOSIS — G3184 Mild cognitive impairment, so stated: Secondary | ICD-10-CM | POA: Diagnosis not present

## 2019-09-15 ENCOUNTER — Encounter: Payer: Self-pay | Admitting: Internal Medicine

## 2019-09-16 MED ORDER — LORAZEPAM 0.5 MG PO TABS: 0.5000 mg | ORAL_TABLET | Freq: Every evening | ORAL | 0 refills | Status: DC | PRN

## 2019-09-16 NOTE — Telephone Encounter (Signed)
Attempted to reach patient. Phone busy

## 2019-09-16 NOTE — Telephone Encounter (Signed)
Pt aware. Did not need this script. His mail order came today. Did not pick up short supply

## 2019-09-16 NOTE — Telephone Encounter (Signed)
I have sent in rx to [facility] for 15 tablets.  Pharmacy may question if flags. May need to explain.  See my chart message.  I did change directions to one q hs prn.

## 2019-09-19 ENCOUNTER — Ambulatory Visit (INDEPENDENT_AMBULATORY_CARE_PROVIDER_SITE_OTHER): Payer: Medicare HMO | Admitting: Internal Medicine

## 2019-09-19 VITALS — BP 136/84 | Ht 68.0 in | Wt 152.0 lb

## 2019-09-19 DIAGNOSIS — F419 Anxiety disorder, unspecified: Secondary | ICD-10-CM | POA: Diagnosis not present

## 2019-09-19 DIAGNOSIS — E7800 Pure hypercholesterolemia, unspecified: Secondary | ICD-10-CM

## 2019-09-19 DIAGNOSIS — I2510 Atherosclerotic heart disease of native coronary artery without angina pectoris: Secondary | ICD-10-CM | POA: Diagnosis not present

## 2019-09-19 DIAGNOSIS — Z9109 Other allergy status, other than to drugs and biological substances: Secondary | ICD-10-CM

## 2019-09-19 DIAGNOSIS — G40909 Epilepsy, unspecified, not intractable, without status epilepticus: Secondary | ICD-10-CM | POA: Diagnosis not present

## 2019-09-19 DIAGNOSIS — R739 Hyperglycemia, unspecified: Secondary | ICD-10-CM | POA: Diagnosis not present

## 2019-09-19 DIAGNOSIS — I10 Essential (primary) hypertension: Secondary | ICD-10-CM | POA: Diagnosis not present

## 2019-09-19 NOTE — Progress Notes (Signed)
Patient ID: Edwin [name], male   DOB: 1941-01-15, 79 y.o.   MRN: [id number]   Virtual Visit via video Note  This visit type was conducted due to national recommendations for restrictions regarding the COVID-19 pandemic (e.g. social distancing).  This format is felt to be most appropriate for this patient at this time.  All issues noted in this document were discussed and addressed.  No physical exam was performed (except for noted visual exam findings with Video Visits).   I connected with [name] by a video enabled telemedicine application and verified that I am speaking with the correct person using two identifiers. Location patient: home Location provider: work Persons participating in the virtual visit: patient, provider and pts wife Danton Clap  The limitations, risks, security and privacy concerns of performing an evaluation and management service by telephone and the availability of in person appointments have been discussed.  The patient expressed understanding and agreed to proceed.   Reason for visit: scheduled follow up.   HPI: He is doing better.  Feels better.  Sleeping better.  Appetite is better.  Has gained weight.  Denies any abdominal pain,nausea or vomiting.  Tries to stay active.  No chest pain or sob reported.  Only taking lorazepam 1 q hs now.  Off the am lorazepam.  Doing well on this regimen.  No further seizures.  On keppra.  Just had f/u with Dr Manuella Ghazi.  No changes made.  Reports double vision has resolved.  No drooping of eyelid.  No headache or dizziness reported.  Blood pressures averaging 129/137/70-80s.  Pulse rate 62-91.  Just saw Dr Ubaldo Glassing.  No changes made.     ROS: See pertinent positives and negatives per HPI.  Past Medical History:  Diagnosis Date  . Allergy   . Arthritis   . Coronary artery disease   . Diverticulosis of colon   . GERD (gastroesophageal reflux disease)    ESOPHAGEAL REFLUX  . Glaucoma   . Gout   . Hemorrhoids   . History of  kidney stones   . Hx of colonic polyp   . Hypercholesterolemia   . Hypertension   . Schatzki's ring   . Seizures ([place])     Past Surgical History:  Procedure Laterality Date  . CARDIAC CATHETERIZATION    . COLONOSCOPY WITH PROPOFOL N/A 05/18/2017   Procedure: COLONOSCOPY WITH PROPOFOL;  Surgeon: Manya Silvas, MD;  Location: [facility] ENDOSCOPY;  Service: Endoscopy;  Laterality: N/A;  . CORONARY ANGIOPLASTY    . EYE SURGERY  07/20/13   cataract  . HERNIA REPAIR Right 2008 & 2012  . VASECTOMY  1984    Family History  Problem Relation Age of Onset  . Arthritis Mother   . Heart disease Mother   . Stroke Mother   . Hypertension Mother   . Arthritis Father   . Hyperlipidemia Father   . Heart disease Father   . Hypertension Father   . Parkinson's disease Father   . Multiple sclerosis Sister   . Colon cancer Maternal Grandmother   . Arthritis Paternal Grandmother   . Colon cancer Paternal Grandmother     SOCIAL HX: reviewed.    Current Outpatient Medications:  .  aspirin 81 MG tablet, Take 81 mg by mouth at bedtime., Disp: , Rfl:  .  azelastine (ASTELIN) 0.1 % nasal spray, Place 1 spray into both nostrils 2 (two) times daily., Disp: 90 mL, Rfl: 1 .  Cholecalciferol (VITAMIN D3) 2000 UNITS TABS, Take 2,000 Units  by mouth daily. , Disp: , Rfl:  .  clopidogrel (PLAVIX) 75 MG tablet, Take 75 mg by mouth at bedtime. , Disp: , Rfl:  .  fexofenadine (ALLEGRA ALLERGY) 180 MG tablet, Take 180 mg by mouth daily., Disp: , Rfl:  .  ibuprofen (ADVIL) 800 MG tablet, Take 1 tablet (800 mg total) by mouth every 8 (eight) hours as needed., Disp: 30 tablet, Rfl: 0 .  latanoprost (XALATAN) 0.005 % ophthalmic solution, Place 1 drop into both eyes at bedtime., Disp: , Rfl:  .  levETIRAcetam (KEPPRA) 500 MG tablet, Take 1 tablet (500 mg total) by mouth 2 (two) times daily., Disp: 60 tablet, Rfl: 2 .  LORazepam (ATIVAN) 0.5 MG tablet, Take 1 tablet (0.5 mg total) by mouth at bedtime as needed for  anxiety., Disp: 15 tablet, Rfl: 0 .  Misc Natural Products (TART CHERRY ADVANCED PO), Take 500 mg by mouth every morning., Disp: , Rfl:  .  Multiple Vitamin (MULTIVITAMIN) tablet, Take 1 tablet by mouth daily., Disp: , Rfl:  .  Omega-3 Fatty Acids (FISH OIL) 1200 MG CAPS, Take 1,200 mg by mouth 2 (two) times daily. , Disp: , Rfl:  .  omeprazole (PRILOSEC) 20 MG capsule, TAKE 1 CAPSULE TWICE DAILY BEFORE MEALS (Patient taking differently: Take 20 mg by mouth 2 (two) times daily before a meal. ), Disp: 180 capsule, Rfl: 3 .  Saw Palmetto 450 MG CAPS, Take 450 mg by mouth daily. , Disp: , Rfl:  .  simvastatin (ZOCOR) 40 MG tablet, Take 40 mg by mouth every evening. , Disp: , Rfl:   EXAM:  VITALS per patient if applicable: 320/23  GENERAL: alert, oriented, appears well and in no acute distress  HEENT: atraumatic, conjunttiva clear, no obvious abnormalities on inspection of external nose and ears  NECK: normal movements of the head and neck  LUNGS: on inspection no signs of respiratory distress, breathing rate appears normal, no obvious gross SOB, gasping or wheezing  CV: no obvious cyanosis  PSYCH/NEURO: pleasant and cooperative, no obvious depression or anxiety, speech and thought processing grossly intact  ASSESSMENT AND PLAN:  Discussed the following assessment and plan:  Anxiety Doing better.  Feels better.  Sleeping better.  Off am lorazepam.  Taking one tablet q hs.  Discussed continuing current dose.  Consider decrease to 1/2 tablet q hs and eventually hope to taper off.  Follow.    CAD (coronary artery disease) Followed by cardiology.  Continue risk factor modification. Currently asymptomatic.    Essential hypertension, benign Blood pressure under good control.  Continue same medication regimen.  Follow pressures.  Follow metabolic panel.    Environmental allergies Controlled.   Hypercholesterolemia On simvastatin.  Low cholesterol diet and exercise.  Follow lipid panel  and liver function tests.    Hyperglycemia Low carb diet and exercise.  Follow met b and a1c.    Seizure disorder On keppra.  No further seizures.  Saw neurology.  Follow.    Orders Placed This Encounter  Procedures  . Lipid panel    Standing Status:   Future    Standing Expiration Date:   09/18/2020  . Hepatic function panel    Standing Status:   Future    Standing Expiration Date:   09/18/2020  . Basic metabolic panel    Standing Status:   Future    Standing Expiration Date:   09/18/2020     I discussed the assessment and treatment plan with the patient. The patient was provided  an opportunity to ask questions and all were answered. The patient agreed with the plan and demonstrated an understanding of the instructions.   The patient was advised to call back or seek an in-person evaluation if the symptoms worsen or if the condition fails to improve as anticipated.   Einar Pheasant, MD

## 2019-09-20 ENCOUNTER — Telehealth: Payer: Self-pay | Admitting: Internal Medicine

## 2019-09-20 NOTE — Telephone Encounter (Signed)
Unable to lm to schedule a cpe in 79m and labs 1-2 days before

## 2019-09-25 ENCOUNTER — Encounter: Payer: Self-pay | Admitting: Internal Medicine

## 2019-09-25 NOTE — Assessment & Plan Note (Signed)
Followed by cardiology.  Continue risk factor modification.  Currently asymptomatic.   

## 2019-09-25 NOTE — Assessment & Plan Note (Signed)
On keppra.  No further seizures.  Saw neurology.  Follow.

## 2019-09-25 NOTE — Assessment & Plan Note (Signed)
On simvastatin.  Low cholesterol diet and exercise.  Follow lipid panel and liver function tests.   

## 2019-09-25 NOTE — Assessment & Plan Note (Signed)
Doing better.  Feels better.  Sleeping better.  Off am lorazepam.  Taking one tablet q hs.  Discussed continuing current dose.  Consider decrease to 1/2 tablet q hs and eventually hope to taper off.  Follow.

## 2019-09-25 NOTE — Assessment & Plan Note (Signed)
Blood pressure under good control.  Continue same medication regimen.  Follow pressures.  Follow metabolic panel.   

## 2019-09-25 NOTE — Assessment & Plan Note (Signed)
Controlled.  

## 2019-09-25 NOTE — Assessment & Plan Note (Signed)
Low carb diet and exercise.  Follow met b and a1c.   

## 2019-11-08 DIAGNOSIS — H40053 Ocular hypertension, bilateral: Secondary | ICD-10-CM | POA: Diagnosis not present

## 2019-12-07 ENCOUNTER — Other Ambulatory Visit: Payer: Self-pay | Admitting: Internal Medicine

## 2019-12-28 ENCOUNTER — Other Ambulatory Visit: Payer: Self-pay

## 2019-12-28 ENCOUNTER — Other Ambulatory Visit (INDEPENDENT_AMBULATORY_CARE_PROVIDER_SITE_OTHER): Payer: Medicare HMO

## 2019-12-28 DIAGNOSIS — E7800 Pure hypercholesterolemia, unspecified: Secondary | ICD-10-CM | POA: Diagnosis not present

## 2019-12-28 DIAGNOSIS — I10 Essential (primary) hypertension: Secondary | ICD-10-CM | POA: Diagnosis not present

## 2019-12-28 LAB — LIPID PANEL
Cholesterol: 144 mg/dL (ref 0–200)
HDL: 29.7 mg/dL — ABNORMAL LOW (ref >39.00)
LDL Cholesterol: 83 mg/dL (ref 0–99)
NonHDL: 113.86
Total CHOL/HDL Ratio: 5
Triglycerides: 154 mg/dL — ABNORMAL HIGH (ref 0.0–149.0)
VLDL: 30.8 mg/dL (ref 0.0–40.0)

## 2019-12-28 LAB — HEPATIC FUNCTION PANEL
ALT: 15 U/L (ref 0–53)
AST: 19 U/L (ref 0–37)
Albumin: 4.1 g/dL (ref 3.5–5.2)
Alkaline Phosphatase: 56 U/L (ref 39–117)
Bilirubin, Direct: 0.2 mg/dL (ref 0.0–0.3)
Total Bilirubin: 1 mg/dL (ref 0.2–1.2)
Total Protein: 7 g/dL (ref 6.0–8.3)

## 2019-12-28 LAB — BASIC METABOLIC PANEL
BUN: 17 mg/dL (ref 6–23)
CO2: 26 mEq/L (ref 19–32)
Calcium: 9.2 mg/dL (ref 8.4–10.5)
Chloride: 104 mEq/L (ref 96–112)
Creatinine, Ser: 1.06 mg/dL (ref 0.40–1.50)
GFR: 67.47 mL/min (ref >60.00)
Glucose, Bld: 112 mg/dL — ABNORMAL HIGH (ref 70–99)
Potassium: 3.8 mEq/L (ref 3.5–5.1)
Sodium: 137 mEq/L (ref 135–145)

## 2019-12-30 ENCOUNTER — Encounter: Payer: Medicare HMO | Admitting: Internal Medicine

## 2019-12-30 ENCOUNTER — Encounter: Payer: Self-pay | Admitting: Internal Medicine

## 2020-01-10 ENCOUNTER — Ambulatory Visit (INDEPENDENT_AMBULATORY_CARE_PROVIDER_SITE_OTHER): Payer: Medicare HMO | Admitting: Internal Medicine

## 2020-01-10 ENCOUNTER — Other Ambulatory Visit: Payer: Self-pay

## 2020-01-10 ENCOUNTER — Encounter: Payer: Self-pay | Admitting: Internal Medicine

## 2020-01-10 DIAGNOSIS — E7800 Pure hypercholesterolemia, unspecified: Secondary | ICD-10-CM

## 2020-01-10 DIAGNOSIS — G40909 Epilepsy, unspecified, not intractable, without status epilepticus: Secondary | ICD-10-CM | POA: Diagnosis not present

## 2020-01-10 DIAGNOSIS — I2510 Atherosclerotic heart disease of native coronary artery without angina pectoris: Secondary | ICD-10-CM

## 2020-01-10 DIAGNOSIS — I10 Essential (primary) hypertension: Secondary | ICD-10-CM

## 2020-01-10 DIAGNOSIS — Z9109 Other allergy status, other than to drugs and biological substances: Secondary | ICD-10-CM

## 2020-01-10 DIAGNOSIS — K219 Gastro-esophageal reflux disease without esophagitis: Secondary | ICD-10-CM

## 2020-01-10 DIAGNOSIS — L989 Disorder of the skin and subcutaneous tissue, unspecified: Secondary | ICD-10-CM | POA: Diagnosis not present

## 2020-01-10 DIAGNOSIS — F419 Anxiety disorder, unspecified: Secondary | ICD-10-CM | POA: Diagnosis not present

## 2020-01-10 DIAGNOSIS — R739 Hyperglycemia, unspecified: Secondary | ICD-10-CM

## 2020-01-10 DIAGNOSIS — Z0000 Encounter for general adult medical examination without abnormal findings: Secondary | ICD-10-CM

## 2020-01-10 MED ORDER — LORAZEPAM 0.5 MG PO TABS: ORAL_TABLET | ORAL | 0 refills | Status: DC

## 2020-01-10 NOTE — Progress Notes (Signed)
Patient ID: Edwin [name], male   DOB: [birth year], 79 y.o.   MRN: [id number]   Subjective:    Patient ID: Edwin [name], male    DOB: August 02, 1941, 79 y.o.   MRN: [id number]  HPI This visit occurred during the SARS-CoV-2 public health emergency.  Safety protocols were in place, including screening questions prior to the visit, additional usage of staff PPE, and extensive cleaning of exam room while observing appropriate contact time as indicated for disinfecting solutions.  Patient here for a scheduled physical exam.  He reports he is doing relatively well.  Eating better.  Has gained his weight back.  No chest pain or sob reported.  No abdominal pain.  Bowels moving.  No further seizures.  On keppra.  Followed by neurology.  On lorazepam.  Has decreased to one before bed.  Discussed decreasing more. Pt in agreement.  Some concerns regarding his memory.  Some change.  Overall doing better.  Does have a persistent leg lesion.  Request referral to Dr Phillip Heal.     Past Medical History:  Diagnosis Date  . Allergy   . Arthritis   . Coronary artery disease   . Diverticulosis of colon   . GERD (gastroesophageal reflux disease)    ESOPHAGEAL REFLUX  . Glaucoma   . Gout   . Hemorrhoids   . History of kidney stones   . Hx of colonic polyp   . Hypercholesterolemia   . Hypertension   . Schatzki's ring   . Seizures ([place])    Past Surgical History:  Procedure Laterality Date  . CARDIAC CATHETERIZATION    . COLONOSCOPY WITH PROPOFOL N/A 05/18/2017   Procedure: COLONOSCOPY WITH PROPOFOL;  Surgeon: Manya Silvas, MD;  [facility] ENDOSCOPY;  Service: Endoscopy;  Laterality: N/A;  . CORONARY ANGIOPLASTY    . EYE SURGERY  07/20/13   cataract  . HERNIA REPAIR Right 2008 & 2012  . VASECTOMY  1984   Family History  Problem Relation Age of Onset  . Arthritis Mother   . Heart disease Mother   . Stroke Mother   . Hypertension Mother   . Arthritis Father   . Hyperlipidemia Father   .  Heart disease Father   . Hypertension Father   . Parkinson's disease Father   . Multiple sclerosis Sister   . Colon cancer Maternal Grandmother   . Arthritis Paternal Grandmother   . Colon cancer Paternal Grandmother    Social History   Socioeconomic History  . Marital status: Married    Spouse name: Not on file  . Number of children: Not on file  . Years of education: Not on file  . Highest education level: Not on file  Occupational History  . Not on file  Tobacco Use  . Smoking status: Never Smoker  . Smokeless tobacco: Never Used  Substance and Sexual Activity  . Alcohol use: No    Alcohol/week: 0.0 standard drinks  . Drug use: No  . Sexual activity: Never  Other Topics Concern  . Not on file  Social History Narrative  . Not on file   Social Determinants of Health   Financial Resource Strain:   . Difficulty of Paying Living Expenses:   Food Insecurity:   . Worried About Charity fundraiser in the Last Year:   . Arboriculturist in the Last Year:   Transportation Needs:   . Film/video editor (Medical):   Marland Kitchen Lack of Transportation (Non-Medical):   Physical  Activity:   . Days of Exercise per Week:   . Minutes of Exercise per Session:   Stress:   . Feeling of Stress :   Social Connections:   . Frequency of Communication with Friends and Family:   . Frequency of Social Gatherings with Friends and Family:   . Attends Religious Services:   . Active Member of Clubs or Organizations:   . Attends Archivist Meetings:   Marland Kitchen Marital Status:     Outpatient Encounter Medications as of 01/10/2020  Medication Sig  . magnesium oxide (MAG-OX) 400 MG tablet Take 400 mg by mouth at bedtime.  . polyethylene glycol (MIRALAX / GLYCOLAX) 17 g packet Take 17 g by mouth daily.  Marland Kitchen aspirin 81 MG tablet Take 81 mg by mouth at bedtime.  Marland Kitchen azelastine (ASTELIN) 0.1 % nasal spray Place 1 spray into both nostrils 2 (two) times daily.  . Cholecalciferol (VITAMIN D3) 2000 UNITS  TABS Take 2,000 Units by mouth daily.   . clopidogrel (PLAVIX) 75 MG tablet Take 75 mg by mouth at bedtime.   . fexofenadine (ALLEGRA ALLERGY) 180 MG tablet Take 180 mg by mouth daily.  Marland Kitchen ibuprofen (ADVIL) 800 MG tablet Take 1 tablet (800 mg total) by mouth every 8 (eight) hours as needed.  . latanoprost (XALATAN) 0.005 % ophthalmic solution Place 1 drop into both eyes at bedtime.  . levETIRAcetam (KEPPRA) 500 MG tablet Take 1 tablet (500 mg total) by mouth 2 (two) times daily.  Marland Kitchen LORazepam (ATIVAN) 0.5 MG tablet Take 1/2 - 1 tablet q hs prn  . Misc Natural Products (TART CHERRY ADVANCED PO) Take 500 mg by mouth every morning.  . Multiple Vitamin (MULTIVITAMIN) tablet Take 1 tablet by mouth daily.  . Omega-3 Fatty Acids (FISH OIL) 1200 MG CAPS Take 1,200 mg by mouth 2 (two) times daily.   Marland Kitchen omeprazole (PRILOSEC) 20 MG capsule TAKE 1 CAPSULE TWICE DAILY BEFORE MEALS (Patient taking differently: Take 40 mg by mouth daily. )  . Saw Palmetto 450 MG CAPS Take 450 mg by mouth daily.   . simvastatin (ZOCOR) 40 MG tablet Take 40 mg by mouth every evening.   . [DISCONTINUED] LORazepam (ATIVAN) 0.5 MG tablet Take 1 tablet (0.5 mg total) by mouth at bedtime as needed for anxiety.   No facility-administered encounter medications on file as of 01/10/2020.    Review of Systems  Constitutional: Negative for appetite change and unexpected weight change.  HENT: Negative for congestion and sinus pressure.   Eyes: Negative for pain and visual disturbance.  Respiratory: Negative for cough, chest tightness and shortness of breath.   Cardiovascular: Negative for chest pain, palpitations and leg swelling.  Gastrointestinal: Negative for abdominal pain, diarrhea, nausea and vomiting.  Genitourinary: Negative for difficulty urinating and dysuria.  Musculoskeletal: Negative for joint swelling and myalgias.  Skin: Negative for color change and rash.  Neurological: Negative for dizziness, light-headedness and  headaches.  Hematological: Negative for adenopathy. Does not bruise/bleed easily.  Psychiatric/Behavioral: Negative for agitation and dysphoric mood.       Objective:    Physical Exam Vitals reviewed.  Constitutional:      General: He is not in acute distress.    Appearance: Normal appearance. He is well-developed.  HENT:     Head: Normocephalic and atraumatic.     Right Ear: External ear normal.     Left Ear: External ear normal.  Eyes:     General: No scleral icterus.  Right eye: No discharge.        Left eye: No discharge.     Conjunctiva/sclera: Conjunctivae normal.  Neck:     Thyroid: No thyromegaly.  Cardiovascular:     Rate and Rhythm: Normal rate and regular rhythm.  Pulmonary:     Effort: No respiratory distress.     Breath sounds: Normal breath sounds. No wheezing.  Abdominal:     General: Bowel sounds are normal.     Palpations: Abdomen is soft.     Tenderness: There is no abdominal tenderness.  Genitourinary:    Comments: Not performed.   Musculoskeletal:        General: No swelling or tenderness.     Cervical back: Neck supple. No tenderness.  Lymphadenopathy:     Cervical: No cervical adenopathy.  Skin:    General: Skin is warm and dry.     Findings: No erythema or rash.  Neurological:     Mental Status: He is alert and oriented to person, place, and time.  Psychiatric:        Mood and Affect: Mood normal.        Behavior: Behavior normal.     BP 122/74   Pulse 84   Temp (!) 97.1 F (36.2 C)   Resp 16   Ht 5' 8"  (1.727 m)   Wt 171 lb 9.6 oz (77.8 kg)   SpO2 98%   BMI 26.09 kg/m  Wt Readings from Last 3 Encounters:  01/10/20 171 lb 9.6 oz (77.8 kg)  09/19/19 152 lb (68.9 kg)  08/18/19 152 lb (68.9 kg)     Lab Results  Component Value Date   WBC 5.4 08/12/2019   HGB 14.8 08/12/2019   HCT 43.2 08/12/2019   PLT 317 08/12/2019   GLUCOSE 112 (H) 12/28/2019   CHOL 144 12/28/2019   TRIG 154.0 (H) 12/28/2019   HDL 29.70 (L)  12/28/2019   LDLDIRECT 99.1 07/29/2013   LDLCALC 83 12/28/2019   ALT 15 12/28/2019   AST 19 12/28/2019   NA 137 12/28/2019   K 3.8 12/28/2019   CL 104 12/28/2019   CREATININE 1.06 12/28/2019   BUN 17 12/28/2019   CO2 26 12/28/2019   TSH 0.66 08/08/2019   PSA 1.37 08/08/2019   HGBA1C 5.4 08/08/2019    CT Head Wo Contrast  Result Date: 08/12/2019 CLINICAL DATA:  Encephalopathy. EXAM: CT HEAD WITHOUT CONTRAST TECHNIQUE: Contiguous axial images were obtained from the base of the skull through the vertex without intravenous contrast. COMPARISON:  MR of head October 09, 2017 FINDINGS: Brain: No evidence of acute infarction, hemorrhage, hydrocephalus, extra-axial collection or mass lesion/mass effect. There is chronic diffuse atrophy. Chronic bilateral periventricular white matter small vessel ischemic changes identified. Small old lacunar infarction is identified in the left basal ganglia. Vascular: No hyperdense vessel is noted. Skull: Normal. Negative for fracture or focal lesion. Sinuses/Orbits: No acute finding. Other: None. IMPRESSION: 1. No focal acute intracranial abnormality identified. 2. Chronic diffuse atrophy. Chronic bilateral periventricular white matter small vessel ischemic change. Electronically Signed   By: Abelardo Diesel M.D.   On: 08/12/2019 13:25       Assessment & Plan:   Problem List Items Addressed This Visit    Anxiety    Doing better.  Feels better.  On lorazepam - one q hs.  Decrease to 1/2 tablet q hs. Hopefully can decrease to off. Follow.        Relevant Medications   LORazepam (ATIVAN) 0.5 MG tablet  CAD (coronary artery disease)    Followed by cardiology.  Continue risk factor modification.  Currently asymptomatic.        Environmental allergies    Controlled.        Essential hypertension, benign    Blood pressure under good control.  Not on medication.  Follow pressures.  Follow metabolic panel.        Relevant Orders   Basic metabolic panel    GERD (gastroesophageal reflux disease)    On omeprazole.  No upper symptoms reported.        Relevant Medications   polyethylene glycol (MIRALAX / GLYCOLAX) 17 g packet   magnesium oxide (MAG-OX) 400 MG tablet   Health care maintenance    Physical today 01/10/20.  Colonoscopy 05/2017 - one small rectal and diverticulosis.  psa 08/08/19 - 1.37.        Hypercholesterolemia    On simvastatin.  Low cholesterol diet and exercise. Follow lipid panel and liver function tests.        Relevant Orders   Lipid panel   Hepatic function panel   Hyperglycemia    Low carb diet and exercise.  Follow met b and a1c.       Leg skin lesion, left    Persistent skin lesion.  Refer to Dr Phillip Heal.        Relevant Orders   Ambulatory referral to Dermatology   Seizure disorder [facility])    On keppra.  No further seizures.  Followed by neurology.  Stable.           Einar Pheasant, MD

## 2020-01-15 ENCOUNTER — Encounter: Payer: Self-pay | Admitting: Internal Medicine

## 2020-01-15 DIAGNOSIS — L989 Disorder of the skin and subcutaneous tissue, unspecified: Secondary | ICD-10-CM | POA: Insufficient documentation

## 2020-01-15 NOTE — Assessment & Plan Note (Signed)
Physical today 01/10/20.  Colonoscopy 05/2017 - one small rectal and diverticulosis.  psa 08/08/19 - 1.37.

## 2020-01-15 NOTE — Assessment & Plan Note (Signed)
Blood pressure under good control.  Not on medication.  Follow pressures.  Follow metabolic panel.

## 2020-01-15 NOTE — Assessment & Plan Note (Signed)
Doing better.  Feels better.  On lorazepam - one q hs.  Decrease to 1/2 tablet q hs. Hopefully can decrease to off. Follow.

## 2020-01-15 NOTE — Assessment & Plan Note (Signed)
Persistent skin lesion.  Refer to Dr Phillip Heal.

## 2020-01-15 NOTE — Assessment & Plan Note (Signed)
On simvastatin.  Low cholesterol diet and exercise.  Follow lipid panel and liver function tests.   

## 2020-01-15 NOTE — Assessment & Plan Note (Signed)
On omeprazole.  No upper symptoms reported.   

## 2020-01-15 NOTE — Assessment & Plan Note (Signed)
Controlled.  

## 2020-01-15 NOTE — Assessment & Plan Note (Signed)
On keppra.  No further seizures.  Followed by neurology.  Stable.  

## 2020-01-15 NOTE — Assessment & Plan Note (Signed)
Low carb diet and exercise.  Follow met b and a1c.  

## 2020-01-15 NOTE — Assessment & Plan Note (Signed)
Followed by cardiology.  Continue risk factor modification.  Currently asymptomatic.   

## 2020-02-14 ENCOUNTER — Ambulatory Visit (INDEPENDENT_AMBULATORY_CARE_PROVIDER_SITE_OTHER): Payer: Medicare HMO

## 2020-02-14 VITALS — BP 116/68 | HR 68 | Ht 68.0 in | Wt 166.0 lb

## 2020-02-14 DIAGNOSIS — Z0000 Encounter for general adult medical examination without abnormal findings: Secondary | ICD-10-CM | POA: Diagnosis not present

## 2020-02-14 NOTE — Patient Instructions (Addendum)
  Edwin [name] , Thank you for taking time to come for your Medicare Wellness Visit. I appreciate your ongoing commitment to your health goals. Please review the following plan we discussed and let me know if I can assist you in the future.   These are the goals we discussed: Goals    . Exercise 5x per week (63min per time)     Patient centered goal is to walk up to 90 min daily, 5 days per week to reach target weight of 165 lbs, weather permitting.        This is a list of the screening recommended for you and due dates:  Health Maintenance  Topic Date Due  .  Hepatitis C: One time screening is recommended by Center for Disease Control  (CDC) for  adults born from [birth year] through 1965.   04/22/2020*  . Flu Shot  04/01/2020  . Tetanus Vaccine  07/05/2024  . COVID-19 Vaccine  Completed  . Pneumonia vaccines  Completed  *Topic was postponed. The date shown is not the original due date.

## 2020-02-14 NOTE — Progress Notes (Addendum)
Subjective:   Edwin [name] is a 79 y.o. male who presents for Medicare Annual/Subsequent preventive examination.  Review of Systems:  No ROS.  Medicare Wellness Virtual Visit.  Cardiac Risk Factors include: male gender;hypertension     Objective:    Vitals: BP 116/68 (BP Location: Left Arm, Patient Position: Sitting, Cuff Size: Normal) Comment: Deferred due to audio visit  Pulse 68   Ht 5\' 8"  (1.727 m)   Wt 166 lb (75.3 kg)   BMI 25.24 kg/m   Body mass index is 25.24 kg/m.  Advanced Directives 02/14/2020 08/12/2019 07/19/2019 07/16/2019 10/14/2017 05/18/2017 10/13/2016  Does Patient Have a Medical Advance Directive? No Yes Yes Yes Yes Yes Yes  Type of Advance Directive - Healthcare Power of [place];Living will [place]  Does patient want to make changes to medical advance directive? - - No - Patient declined No - Patient declined No - Patient declined - No - Patient declined  Copy of [place] in Chart? - - No - copy requested Yes - validated most recent copy scanned in chart (See row information) No - copy requested No - copy requested No - copy requested  Would patient like information on creating a medical advance directive? No - Patient declined - - - - - -    Tobacco Social History   Tobacco Use  Smoking Status Never Smoker  Smokeless Tobacco Never Used     Counseling given: Not Answered   Clinical Intake:  Pre-visit preparation completed: Yes        Diabetes: No  How often do you need to have someone help you when you read instructions, pamphlets, or other written materials from your doctor or pharmacy?: 1 - Never  Interpreter Needed?: No     Past Medical History:  Diagnosis Date  . Allergy   . Arthritis   . Coronary artery disease   . Diverticulosis of colon   . GERD  (gastroesophageal reflux disease)    ESOPHAGEAL REFLUX  . Glaucoma   . Gout   . Hemorrhoids   . History of kidney stones   . Hx of colonic polyp   . Hypercholesterolemia   . Hypertension   . Schatzki's ring   . Seizures ([place])    Past Surgical History:  Procedure Laterality Date  . CARDIAC CATHETERIZATION    . COLONOSCOPY WITH PROPOFOL N/A 05/18/2017   Procedure: COLONOSCOPY WITH PROPOFOL;  Surgeon: Manya Silvas, MD;  Location[facility] ENDOSCOPY;  Service: Endoscopy;  Laterality: N/A;  . CORONARY ANGIOPLASTY    . EYE SURGERY  07/20/13   cataract  . HERNIA REPAIR Right 2008 & 2012  . VASECTOMY  1984   Family History  Problem Relation Age of Onset  . Arthritis Mother   . Heart disease Mother   . Stroke Mother   . Hypertension Mother   . Arthritis Father   . Hyperlipidemia Father   . Heart disease Father   . Hypertension Father   . Parkinson's disease Father   . Multiple sclerosis Sister   . Colon cancer Maternal Grandmother   . Arthritis Paternal Grandmother   . Colon cancer Paternal Grandmother    Social History   Socioeconomic History  . Marital status: Married    Spouse name: Not on file  . Number of children: Not on file  . Years of education: Not on file  .  Highest education level: Not on file  Occupational History  . Not on file  Tobacco Use  . Smoking status: Never Smoker  . Smokeless tobacco: Never Used  Vaping Use  . Vaping Use: Never used  Substance and Sexual Activity  . Alcohol use: No    Alcohol/week: 0.0 standard drinks  . Drug use: No  . Sexual activity: Never  Other Topics Concern  . Not on file  Social History Narrative  . Not on file   Social Determinants of Health   Financial Resource Strain:   . Difficulty of Paying Living Expenses:   Food Insecurity:   . Worried About Charity fundraiser in the Last Year:   . Arboriculturist in the Last Year:   Transportation Needs:   . Film/video editor (Medical):   Marland Kitchen Lack of  Transportation (Non-Medical):   Physical Activity:   . Days of Exercise per Week:   . Minutes of Exercise per Session:   Stress:   . Feeling of Stress :   Social Connections:   . Frequency of Communication with Friends and Family:   . Frequency of Social Gatherings with Friends and Family:   . Attends Religious Services:   . Active Member of Clubs or Organizations:   . Attends Archivist Meetings:   Marland Kitchen Marital Status:     Outpatient Encounter Medications as of 02/14/2020  Medication Sig  . aspirin 81 MG tablet Take 81 mg by mouth at bedtime.  Marland Kitchen azelastine (ASTELIN) 0.1 % nasal spray Place 1 spray into both nostrils 2 (two) times daily.  . Cholecalciferol (VITAMIN D3) 2000 UNITS TABS Take 2,000 Units by mouth daily.   . clopidogrel (PLAVIX) 75 MG tablet Take 75 mg by mouth at bedtime.   . fexofenadine (ALLEGRA ALLERGY) 180 MG tablet Take 180 mg by mouth daily.  Marland Kitchen ibuprofen (ADVIL) 800 MG tablet Take 1 tablet (800 mg total) by mouth every 8 (eight) hours as needed.  . latanoprost (XALATAN) 0.005 % ophthalmic solution Place 1 drop into both eyes at bedtime.  . levETIRAcetam (KEPPRA) 500 MG tablet Take 1 tablet (500 mg total) by mouth 2 (two) times daily.  Marland Kitchen LORazepam (ATIVAN) 0.5 MG tablet Take 1/2 - 1 tablet q hs prn  . magnesium oxide (MAG-OX) 400 MG tablet Take 400 mg by mouth at bedtime.  . Misc Natural Products (TART CHERRY ADVANCED PO) Take 500 mg by mouth every morning.  . Multiple Vitamin (MULTIVITAMIN) tablet Take 1 tablet by mouth daily.  . Omega-3 Fatty Acids (FISH OIL) 1200 MG CAPS Take 1,200 mg by mouth 2 (two) times daily.   Marland Kitchen omeprazole (PRILOSEC) 20 MG capsule TAKE 1 CAPSULE TWICE DAILY BEFORE MEALS (Patient taking differently: Take 40 mg by mouth daily. )  . polyethylene glycol (MIRALAX / GLYCOLAX) 17 g packet Take 17 g by mouth daily.  . Saw Palmetto 450 MG CAPS Take 450 mg by mouth daily.   . simvastatin (ZOCOR) 40 MG tablet Take 40 mg by mouth every evening.     No facility-administered encounter medications on file as of 02/14/2020.    Activities of Daily Living In your present state of health, do you have any difficulty performing the following activities: 02/14/2020 07/19/2019  Hearing? N N  Vision? N N  Difficulty concentrating or making decisions? N N  Walking or climbing stairs? N N  Dressing or bathing? N N  Doing errands, shopping? N N  Preparing Food and eating ?  N -  Using the Toilet? N -  In the past six months, have you accidently leaked urine? N -  Do you have problems with loss of bowel control? N -  Managing your Medications? N -  Managing your Finances? N -  Housekeeping or managing your Housekeeping? N -  Some recent data might be hidden    Patient Care Team: Einar Pheasant, MD as PCP - General (Internal Medicine)   Assessment:   This is a routine wellness examination for Boby.  I connected with Milfred today by telephone and verified that I am speaking with the correct person using two identifiers. Location patient: home Location provider: work Persons participating in the virtual visit: patient, Marine scientist.    I discussed the limitations, risks, security and privacy concerns of performing an evaluation and management service by telephone and the availability of in person appointments. The patient expressed understanding and verbally consented to this telephonic visit.    Interactive audio and video telecommunications were attempted between this provider and patient, however failed, due to patient having technical difficulties OR patient did not have access to video capability.  We continued and completed visit with audio only.  Some vital signs may be absent or patient reported.   Patient is alert and oriented x3. Patient denies difficulty focusing or concentrating.  Health Maintenance Due: -Hep C Screening See completed HM at the end of note.   Eye: Visual acuity not assessed. Virtual visit. Followed by their  ophthalmologist.  Dental: Visits every 6 months.    Hearing: Demonstrates normal hearing during visit. L ear more difficult hearing   Safety:  Patient feels safe at home- yes Patient does have smoke detectors at home- yes Patient does wear sunscreen or protective clothing when in direct sunlight - yes Patient does wear seat belt when in a moving vehicle - yes Patient drives- yes Adequate lighting in walkways free from debris- yes Grab bars and handrails used as appropriate- yes Ambulates with an assistive device- no Cell phone on person when ambulating outside of the home-yes  Social: Alcohol intake - no  Smoking history- never  Smokers in home? none Illicit drug use? none  Medication: Taking as directed and without issues.  Self managed - yes   Covid-19: Precautions and sickness symptoms discussed. Wears mask, social distancing, hand hygiene as appropriate.   Activities of Daily Living Patient denies needing assistance with: household chores, feeding themselves, getting from bed to chair, getting to the toilet, bathing/showering, dressing, managing money, or preparing meals.   Discussed the importance of a healthy diet, water intake and the benefits of aerobic exercise.   Physical activity- active at home, no routine  Diet:  Regular Water: good intake  Other Providers Patient Care Team: Einar Pheasant, MD as PCP - General (Internal Medicine)  Exercise Activities and Dietary recommendations Current Exercise Habits: Home exercise routine, Intensity: Mild  Goals    . Exercise 5x per week (101min per time)     Patient centered goal is to walk up to 90 min daily, 5 days per week to reach target weight of 165 lbs, weather permitting.        Fall Risk Fall Risk  02/14/2020 08/09/2019 08/09/2019 07/26/2019 10/14/2017  Falls in the past year? 0 0 0 0 No  Number falls in past yr: 0 0 0 0 -  Injury with Fall? - 0 0 0 -  Follow up Falls evaluation completed - - - -    Is the patient's  home free of loose throw rugs in walkways, pet beds, electrical cords, etc?   Yes       Handrails on the stairs?  Yes      Adequate lighting?  Yes  Timed Get Up and Go Performed: No, virtual visit  Depression Screen PHQ 2/9 Scores 02/14/2020 08/10/2019 08/01/2019 10/14/2017  PHQ - 2 Score 0 3 0 0  PHQ- 9 Score - 12 - -    Cognitive Function MMSE - Mini Mental State Exam 10/14/2017 10/12/2015  Orientation to time 5 5  Orientation to Place 5 5  Registration 3 3  Attention/ Calculation 5 5  Recall 2 3  Language- name 2 objects 2 2  Language- repeat 1 1  Language- follow 3 step command 3 3  Language- read & follow direction 1 1  Write a sentence 1 1  Copy design 1 1  Total score 29 30     6CIT Screen 02/14/2020 10/13/2016  What Year? - 0 points  What month? - 0 points  What time? - 0 points  Count back from 20 - 0 points  Months in reverse 0 points 0 points  Repeat phrase - 0 points  Total Score - 0    Immunization History  Administered Date(s) Administered  . Fluad Quad(high Dose 65+) 06/29/2019  . Influenza, High Dose Seasonal PF 06/12/2016, 06/17/2017, 06/23/2018  . Influenza,inj,Quad PF,6+ Mos 07/29/2013, 06/20/2014, 07/18/2015  . PFIZER SARS-COV-2 Vaccination 09/19/2019, 10/10/2019  . Pneumococcal Conjugate-13 08/09/2013  . Pneumococcal Polysaccharide-23 07/18/2015  . Tdap 09/01/2004, 07/05/2014  . Zoster 08/29/2013  . Zoster Recombinat (Shingrix) 06/14/2019, 12/09/2019   Screening Tests Health Maintenance  Topic Date Due  . Hepatitis C Screening  Never done  . INFLUENZA VACCINE  04/01/2020  . TETANUS/TDAP  07/05/2024  . COVID-19 Vaccine  Completed  . PNA vac Low Risk Adult  Completed   Cancer Screenings: Lung: Low Dose CT Chest recommended if Age 74-80 years, 30 pack-year currently smoking OR have quit w/in 15years. Patient does not qualify.     Plan:   Keep all routine maintenance appointments.   Next scheduled lab 04/23/20 @  8:15  Follow up 04/25/20 @ 9:00  Medicare Attestation I have personally reviewed: The patient's medical and social history Their use of alcohol, tobacco or illicit drugs Their current medications and supplements The patient's functional ability including ADLs,fall risks, home safety risks, cognitive, and hearing and visual impairment Diet and physical activities Evidence for depression   I have reviewed and discussed with patient certain preventive protocols, quality metrics, and best practice recommendations.      Varney Biles, LPN  11/28/760  Agree with plan. Mable Paris, NP

## 2020-02-15 DIAGNOSIS — L570 Actinic keratosis: Secondary | ICD-10-CM | POA: Diagnosis not present

## 2020-03-19 DIAGNOSIS — G4733 Obstructive sleep apnea (adult) (pediatric): Secondary | ICD-10-CM | POA: Diagnosis not present

## 2020-03-19 DIAGNOSIS — Z9861 Coronary angioplasty status: Secondary | ICD-10-CM | POA: Diagnosis not present

## 2020-03-19 DIAGNOSIS — I10 Essential (primary) hypertension: Secondary | ICD-10-CM | POA: Diagnosis not present

## 2020-03-19 DIAGNOSIS — E7800 Pure hypercholesterolemia, unspecified: Secondary | ICD-10-CM | POA: Diagnosis not present

## 2020-03-19 DIAGNOSIS — I2510 Atherosclerotic heart disease of native coronary artery without angina pectoris: Secondary | ICD-10-CM | POA: Diagnosis not present

## 2020-03-20 ENCOUNTER — Encounter: Payer: Self-pay | Admitting: Internal Medicine

## 2020-03-21 MED ORDER — LORAZEPAM 0.5 MG PO TABS: ORAL_TABLET | ORAL | 0 refills | Status: DC

## 2020-03-21 NOTE — Telephone Encounter (Signed)
rx sent in for lorazepam 1/2 tablet q hs prn #30 with no refills.

## 2020-03-23 DIAGNOSIS — L821 Other seborrheic keratosis: Secondary | ICD-10-CM | POA: Diagnosis not present

## 2020-03-23 DIAGNOSIS — L570 Actinic keratosis: Secondary | ICD-10-CM | POA: Diagnosis not present

## 2020-04-06 DIAGNOSIS — R430 Anosmia: Secondary | ICD-10-CM | POA: Diagnosis not present

## 2020-04-06 DIAGNOSIS — H9042 Sensorineural hearing loss, unilateral, left ear, with unrestricted hearing on the contralateral side: Secondary | ICD-10-CM | POA: Diagnosis not present

## 2020-04-06 DIAGNOSIS — H6121 Impacted cerumen, right ear: Secondary | ICD-10-CM | POA: Diagnosis not present

## 2020-04-23 ENCOUNTER — Other Ambulatory Visit (INDEPENDENT_AMBULATORY_CARE_PROVIDER_SITE_OTHER): Payer: Medicare HMO

## 2020-04-23 ENCOUNTER — Other Ambulatory Visit: Payer: Self-pay

## 2020-04-23 DIAGNOSIS — E7800 Pure hypercholesterolemia, unspecified: Secondary | ICD-10-CM | POA: Diagnosis not present

## 2020-04-23 DIAGNOSIS — I10 Essential (primary) hypertension: Secondary | ICD-10-CM | POA: Diagnosis not present

## 2020-04-23 LAB — BASIC METABOLIC PANEL
BUN: 13 mg/dL (ref 6–23)
CO2: 22 mEq/L (ref 19–32)
Calcium: 9.8 mg/dL (ref 8.4–10.5)
Chloride: 105 mEq/L (ref 96–112)
Creatinine, Ser: 1.13 mg/dL (ref 0.40–1.50)
GFR: 62.62 mL/min (ref >60.00)
Glucose, Bld: 108 mg/dL — ABNORMAL HIGH (ref 70–99)
Potassium: 3.9 mEq/L (ref 3.5–5.1)
Sodium: 138 mEq/L (ref 135–145)

## 2020-04-23 LAB — HEPATIC FUNCTION PANEL
ALT: 16 U/L (ref 0–53)
AST: 20 U/L (ref 0–37)
Albumin: 4.3 g/dL (ref 3.5–5.2)
Alkaline Phosphatase: 49 U/L (ref 39–117)
Bilirubin, Direct: 0.2 mg/dL (ref 0.0–0.3)
Total Bilirubin: 1.3 mg/dL — ABNORMAL HIGH (ref 0.2–1.2)
Total Protein: 7.2 g/dL (ref 6.0–8.3)

## 2020-04-23 LAB — LIPID PANEL
Cholesterol: 141 mg/dL (ref 0–200)
HDL: 33.3 mg/dL — ABNORMAL LOW (ref >39.00)
LDL Cholesterol: 74 mg/dL (ref 0–99)
NonHDL: 107.45
Total CHOL/HDL Ratio: 4
Triglycerides: 166 mg/dL — ABNORMAL HIGH (ref 0.0–149.0)
VLDL: 33.2 mg/dL (ref 0.0–40.0)

## 2020-04-25 ENCOUNTER — Ambulatory Visit (INDEPENDENT_AMBULATORY_CARE_PROVIDER_SITE_OTHER): Payer: Medicare HMO | Admitting: Internal Medicine

## 2020-04-25 ENCOUNTER — Encounter: Payer: Self-pay | Admitting: Internal Medicine

## 2020-04-25 ENCOUNTER — Other Ambulatory Visit: Payer: Self-pay

## 2020-04-25 VITALS — BP 126/78 | HR 58 | Temp 98.0°F | Resp 16 | Ht 68.0 in | Wt 165.0 lb

## 2020-04-25 DIAGNOSIS — I2510 Atherosclerotic heart disease of native coronary artery without angina pectoris: Secondary | ICD-10-CM

## 2020-04-25 DIAGNOSIS — F419 Anxiety disorder, unspecified: Secondary | ICD-10-CM | POA: Diagnosis not present

## 2020-04-25 DIAGNOSIS — Z86010 Personal history of colonic polyps: Secondary | ICD-10-CM

## 2020-04-25 DIAGNOSIS — K219 Gastro-esophageal reflux disease without esophagitis: Secondary | ICD-10-CM | POA: Diagnosis not present

## 2020-04-25 DIAGNOSIS — Z125 Encounter for screening for malignant neoplasm of prostate: Secondary | ICD-10-CM

## 2020-04-25 DIAGNOSIS — E7800 Pure hypercholesterolemia, unspecified: Secondary | ICD-10-CM

## 2020-04-25 DIAGNOSIS — G40909 Epilepsy, unspecified, not intractable, without status epilepticus: Secondary | ICD-10-CM

## 2020-04-25 DIAGNOSIS — R739 Hyperglycemia, unspecified: Secondary | ICD-10-CM | POA: Diagnosis not present

## 2020-04-25 DIAGNOSIS — I10 Essential (primary) hypertension: Secondary | ICD-10-CM

## 2020-04-25 NOTE — Progress Notes (Signed)
Patient ID: Edwin [name], male   DOB: November 28, 1940, 79 y.o.   MRN: [id number]   Subjective:    Patient ID: Edwin [name], male    DOB: November 19, 1940, 79 y.o.   MRN: [id number]  HPI This visit occurred during the SARS-CoV-2 public health emergency.  Safety protocols were in place, including screening questions prior to the visit, additional usage of staff PPE, and extensive cleaning of exam room while observing appropriate contact time as indicated for disinfecting solutions.  Patient here for a scheduled follow up. He reports he is doing relatively well.  Trying to stay active.  No chest pain or sob reported.  No abdominal pain or bowel change reported.  Blood pressures averaging 120-130/70s. Handling stress.  Has decreased lorazepam.  Had gradually tapered.  Doing well.     Past Medical History:  Diagnosis Date  . Allergy   . Arthritis   . Coronary artery disease   . Diverticulosis of colon   . GERD (gastroesophageal reflux disease)    ESOPHAGEAL REFLUX  . Glaucoma   . Gout   . Hemorrhoids   . History of kidney stones   . Hx of colonic polyp   . Hypercholesterolemia   . Hypertension   . Schatzki's ring   . Seizures ([place])    Past Surgical History:  Procedure Laterality Date  . CARDIAC CATHETERIZATION    . COLONOSCOPY WITH PROPOFOL N/A 05/18/2017   Procedure: COLONOSCOPY WITH PROPOFOL;  Surgeon: Manya Silvas, MD;  Location: [facility] ENDOSCOPY;  Service: Endoscopy;  Laterality: N/A;  . CORONARY ANGIOPLASTY    . EYE SURGERY  07/20/13   cataract  . HERNIA REPAIR Right 2008 & 2012  . VASECTOMY  1984   Family History  Problem Relation Age of Onset  . Arthritis Mother   . Heart disease Mother   . Stroke Mother   . Hypertension Mother   . Arthritis Father   . Hyperlipidemia Father   . Heart disease Father   . Hypertension Father   . Parkinson's disease Father   . Multiple sclerosis Sister   . Colon cancer Maternal Grandmother   . Arthritis Paternal Grandmother   .  Colon cancer Paternal Grandmother    Social History   Socioeconomic History  . Marital status: Married    Spouse name: Not on file  . Number of children: Not on file  . Years of education: Not on file  . Highest education level: Not on file  Occupational History  . Not on file  Tobacco Use  . Smoking status: Never Smoker  . Smokeless tobacco: Never Used  Vaping Use  . Vaping Use: Never used  Substance and Sexual Activity  . Alcohol use: No    Alcohol/week: 0.0 standard drinks  . Drug use: No  . Sexual activity: Never  Other Topics Concern  . Not on file  Social History Narrative  . Not on file   Social Determinants of Health   Financial Resource Strain:   . Difficulty of Paying Living Expenses: Not on file  Food Insecurity:   . Worried About Charity fundraiser in the Last Year: Not on file  . Ran Out of Food in the Last Year: Not on file  Transportation Needs:   . Lack of Transportation (Medical): Not on file  . Lack of Transportation (Non-Medical): Not on file  Physical Activity:   . Days of Exercise per Week: Not on file  . Minutes of Exercise per Session: Not on  file  Stress:   . Feeling of Stress : Not on file  Social Connections:   . Frequency of Communication with Friends and Family: Not on file  . Frequency of Social Gatherings with Friends and Family: Not on file  . Attends Religious Services: Not on file  . Active Member of Clubs or Organizations: Not on file  . Attends Archivist Meetings: Not on file  . Marital Status: Not on file    Outpatient Encounter Medications as of 04/25/2020  Medication Sig  . aspirin 81 MG tablet Take 81 mg by mouth at bedtime.  Marland Kitchen azelastine (ASTELIN) 0.1 % nasal spray Place 1 spray into both nostrils 2 (two) times daily.  . Cholecalciferol (VITAMIN D3) 2000 UNITS TABS Take 2,000 Units by mouth daily.   . clopidogrel (PLAVIX) 75 MG tablet Take 75 mg by mouth at bedtime.   . fexofenadine (ALLEGRA ALLERGY) 180 MG  tablet Take 180 mg by mouth daily.  Marland Kitchen ibuprofen (ADVIL) 800 MG tablet Take 1 tablet (800 mg total) by mouth every 8 (eight) hours as needed.  . latanoprost (XALATAN) 0.005 % ophthalmic solution Place 1 drop into both eyes at bedtime.  . levETIRAcetam (KEPPRA) 500 MG tablet Take 1 tablet (500 mg total) by mouth 2 (two) times daily.  Marland Kitchen LORazepam (ATIVAN) 0.5 MG tablet Take 1/2 tablet q hs prn  . magnesium oxide (MAG-OX) 400 MG tablet Take 400 mg by mouth at bedtime.  . Misc Natural Products (TART CHERRY ADVANCED PO) Take 500 mg by mouth every morning.  . Multiple Vitamin (MULTIVITAMIN) tablet Take 1 tablet by mouth daily.  . Omega-3 Fatty Acids (FISH OIL) 1200 MG CAPS Take 1,200 mg by mouth 2 (two) times daily.   Marland Kitchen omeprazole (PRILOSEC) 20 MG capsule TAKE 1 CAPSULE TWICE DAILY BEFORE MEALS (Patient taking differently: Take 40 mg by mouth daily. )  . polyethylene glycol (MIRALAX / GLYCOLAX) 17 g packet Take 17 g by mouth daily.  . Saw Palmetto 450 MG CAPS Take 450 mg by mouth daily.   . simvastatin (ZOCOR) 40 MG tablet Take 40 mg by mouth every evening.   . [DISCONTINUED] LORazepam (ATIVAN) 0.5 MG tablet Take 1/2 tablet q hs prn   No facility-administered encounter medications on file as of 04/25/2020.    Review of Systems  Constitutional: Negative for appetite change and unexpected weight change.  HENT: Negative for congestion and sinus pressure.   Respiratory: Negative for cough, chest tightness and shortness of breath.   Cardiovascular: Negative for chest pain, palpitations and leg swelling.  Gastrointestinal: Negative for abdominal pain, diarrhea, nausea and vomiting.  Genitourinary: Negative for difficulty urinating and dysuria.  Musculoskeletal: Negative for joint swelling and myalgias.  Skin: Negative for color change and rash.  Neurological: Negative for dizziness, light-headedness and headaches.  Psychiatric/Behavioral: Negative for agitation and dysphoric mood.       Objective:      Physical Exam Vitals reviewed.  Constitutional:      General: He is not in acute distress.    Appearance: Normal appearance. He is well-developed.  HENT:     Head: Normocephalic and atraumatic.     Right Ear: External ear normal.     Left Ear: External ear normal.  Eyes:     General: No scleral icterus.       Right eye: No discharge.        Left eye: No discharge.     Conjunctiva/sclera: Conjunctivae normal.  Cardiovascular:  Rate and Rhythm: Normal rate and regular rhythm.  Pulmonary:     Effort: Pulmonary effort is normal. No respiratory distress.     Breath sounds: Normal breath sounds.  Abdominal:     General: Bowel sounds are normal.     Palpations: Abdomen is soft.     Tenderness: There is no abdominal tenderness.  Musculoskeletal:        General: No swelling or tenderness.     Cervical back: Neck supple. No tenderness.  Lymphadenopathy:     Cervical: No cervical adenopathy.  Skin:    Findings: No erythema or rash.  Neurological:     Mental Status: He is alert.  Psychiatric:        Mood and Affect: Mood normal.        Behavior: Behavior normal.     BP 126/78   Pulse (!) 58   Temp 98 F (36.7 C) (Oral)   Resp 16   Ht 5' 8"  (1.727 m)   Wt 165 lb (74.8 kg)   SpO2 96%   BMI 25.09 kg/m  Wt Readings from Last 3 Encounters:  04/25/20 165 lb (74.8 kg)  02/14/20 166 lb (75.3 kg)  01/10/20 171 lb 9.6 oz (77.8 kg)     Lab Results  Component Value Date   WBC 5.4 08/12/2019   HGB 14.8 08/12/2019   HCT 43.2 08/12/2019   PLT 317 08/12/2019   GLUCOSE 108 (H) 04/23/2020   CHOL 141 04/23/2020   TRIG 166.0 (H) 04/23/2020   HDL 33.30 (L) 04/23/2020   LDLDIRECT 99.1 07/29/2013   LDLCALC 74 04/23/2020   ALT 16 04/23/2020   AST 20 04/23/2020   NA 138 04/23/2020   K 3.9 04/23/2020   CL 105 04/23/2020   CREATININE 1.13 04/23/2020   BUN 13 04/23/2020   CO2 22 04/23/2020   TSH 0.66 08/08/2019   PSA 1.37 08/08/2019   HGBA1C 5.4 08/08/2019    CT Head  Wo Contrast  Result Date: 08/12/2019 CLINICAL DATA:  Encephalopathy. EXAM: CT HEAD WITHOUT CONTRAST TECHNIQUE: Contiguous axial images were obtained from the base of the skull through the vertex without intravenous contrast. COMPARISON:  MR of head October 09, 2017 FINDINGS: Brain: No evidence of acute infarction, hemorrhage, hydrocephalus, extra-axial collection or mass lesion/mass effect. There is chronic diffuse atrophy. Chronic bilateral periventricular white matter small vessel ischemic changes identified. Small old lacunar infarction is identified in the left basal ganglia. Vascular: No hyperdense vessel is noted. Skull: Normal. Negative for fracture or focal lesion. Sinuses/Orbits: No acute finding. Other: None. IMPRESSION: 1. No focal acute intracranial abnormality identified. 2. Chronic diffuse atrophy. Chronic bilateral periventricular white matter small vessel ischemic change. Electronically Signed   By: Abelardo Diesel M.D.   On: 08/12/2019 13:25       Assessment & Plan:   Problem List Items Addressed This Visit    Seizure disorder ([place])    On keppra.  No further seizures.  Followed by neurology.  Stable.       Hyperglycemia    Low carb diet and exercise.  Follow met b and a1c.       Relevant Orders   Hemoglobin A1c   Hypercholesterolemia    On simvastatin.  Low cholesterol diet and exercise.  Follow lipid panel and liver function tests.        Relevant Orders   Hepatic function panel   Lipid panel   History of colonic polyps    Colonoscopy 05/2017 - pathology - moderate active  colitis, tubular adenoma.       GERD (gastroesophageal reflux disease)    No upper symptoms reported. On omeprazole.       Essential hypertension, benign    Outside blood pressure reviewed and as outlined.  Doing well.  On no medication.  Follow pressures.  Follow metabolic panel.       Relevant Orders   CBC with Differential/Platelet   TSH   Basic metabolic panel   CAD (coronary artery  disease)    Followed by cardiology.  Continue risk factor modification.  Currently asymptomatic.        Anxiety    Doing better.  Continue to taper lorazepam.  Follow.        Relevant Medications   LORazepam (ATIVAN) 0.5 MG tablet    Other Visit Diagnoses    Prostate cancer screening    -  Primary   Relevant Orders   PSA, Medicare       Einar Pheasant, MD

## 2020-05-06 MED ORDER — LORAZEPAM 0.5 MG PO TABS: ORAL_TABLET | ORAL | 0 refills | Status: DC

## 2020-05-06 NOTE — Assessment & Plan Note (Signed)
Followed by cardiology.  Continue risk factor modification.  Currently asymptomatic.

## 2020-05-06 NOTE — Assessment & Plan Note (Signed)
No upper symptoms reported.  On omeprazole.  

## 2020-05-06 NOTE — Assessment & Plan Note (Signed)
Doing better.  Continue to taper lorazepam.  Follow.

## 2020-05-06 NOTE — Assessment & Plan Note (Signed)
Outside blood pressure reviewed and as outlined.  Doing well.  On no medication.  Follow pressures.  Follow metabolic panel.

## 2020-05-06 NOTE — Assessment & Plan Note (Signed)
On simvastatin.  Low cholesterol diet and exercise.  Follow lipid panel and liver function tests.   

## 2020-05-06 NOTE — Assessment & Plan Note (Signed)
On keppra.  No further seizures.  Followed by neurology.  Stable.

## 2020-05-06 NOTE — Assessment & Plan Note (Signed)
Low carb diet and exercise.  Follow met b and a1c.  

## 2020-05-06 NOTE — Assessment & Plan Note (Signed)
Colonoscopy 05/2017 - pathology - moderate active colitis, tubular adenoma.  ?

## 2020-05-08 DIAGNOSIS — H40053 Ocular hypertension, bilateral: Secondary | ICD-10-CM | POA: Diagnosis not present

## 2020-05-15 DIAGNOSIS — H40003 Preglaucoma, unspecified, bilateral: Secondary | ICD-10-CM | POA: Diagnosis not present

## 2020-06-04 ENCOUNTER — Ambulatory Visit (INDEPENDENT_AMBULATORY_CARE_PROVIDER_SITE_OTHER): Payer: Medicare HMO

## 2020-06-04 ENCOUNTER — Other Ambulatory Visit: Payer: Self-pay

## 2020-06-04 DIAGNOSIS — Z23 Encounter for immunization: Secondary | ICD-10-CM

## 2020-06-13 ENCOUNTER — Encounter: Payer: Self-pay | Admitting: Internal Medicine

## 2020-07-02 DIAGNOSIS — L578 Other skin changes due to chronic exposure to nonionizing radiation: Secondary | ICD-10-CM | POA: Diagnosis not present

## 2020-07-02 DIAGNOSIS — Z859 Personal history of malignant neoplasm, unspecified: Secondary | ICD-10-CM | POA: Diagnosis not present

## 2020-07-02 DIAGNOSIS — Z872 Personal history of diseases of the skin and subcutaneous tissue: Secondary | ICD-10-CM | POA: Diagnosis not present

## 2020-07-02 DIAGNOSIS — L570 Actinic keratosis: Secondary | ICD-10-CM | POA: Diagnosis not present

## 2020-07-02 DIAGNOSIS — L218 Other seborrheic dermatitis: Secondary | ICD-10-CM | POA: Diagnosis not present

## 2020-07-02 DIAGNOSIS — D692 Other nonthrombocytopenic purpura: Secondary | ICD-10-CM | POA: Diagnosis not present

## 2020-08-23 ENCOUNTER — Other Ambulatory Visit: Payer: Medicare HMO

## 2020-08-27 ENCOUNTER — Ambulatory Visit: Payer: Medicare HMO | Admitting: Internal Medicine

## 2020-09-06 DIAGNOSIS — R569 Unspecified convulsions: Secondary | ICD-10-CM | POA: Diagnosis not present

## 2020-09-06 DIAGNOSIS — G4733 Obstructive sleep apnea (adult) (pediatric): Secondary | ICD-10-CM | POA: Diagnosis not present

## 2020-09-06 DIAGNOSIS — R259 Unspecified abnormal involuntary movements: Secondary | ICD-10-CM | POA: Diagnosis not present

## 2020-09-10 ENCOUNTER — Other Ambulatory Visit (INDEPENDENT_AMBULATORY_CARE_PROVIDER_SITE_OTHER): Payer: Medicare HMO

## 2020-09-10 ENCOUNTER — Other Ambulatory Visit: Payer: Self-pay

## 2020-09-10 DIAGNOSIS — R739 Hyperglycemia, unspecified: Secondary | ICD-10-CM

## 2020-09-10 DIAGNOSIS — I10 Essential (primary) hypertension: Secondary | ICD-10-CM | POA: Diagnosis not present

## 2020-09-10 DIAGNOSIS — E7800 Pure hypercholesterolemia, unspecified: Secondary | ICD-10-CM

## 2020-09-10 DIAGNOSIS — Z125 Encounter for screening for malignant neoplasm of prostate: Secondary | ICD-10-CM

## 2020-09-10 LAB — LIPID PANEL
Cholesterol: 152 mg/dL (ref 0–200)
HDL: 33.7 mg/dL — ABNORMAL LOW
LDL Cholesterol: 88 mg/dL (ref 0–99)
NonHDL: 118.24
Total CHOL/HDL Ratio: 5
Triglycerides: 151 mg/dL — ABNORMAL HIGH (ref 0.0–149.0)
VLDL: 30.2 mg/dL (ref 0.0–40.0)

## 2020-09-10 LAB — CBC WITH DIFFERENTIAL/PLATELET
Basophils Absolute: 0.1 K/uL (ref 0.0–0.1)
Basophils Relative: 0.8 % (ref 0.0–3.0)
Eosinophils Absolute: 0.2 K/uL (ref 0.0–0.7)
Eosinophils Relative: 3.5 % (ref 0.0–5.0)
HCT: 42.7 % (ref 39.0–52.0)
Hemoglobin: 14.7 g/dL (ref 13.0–17.0)
Lymphocytes Relative: 25.4 % (ref 12.0–46.0)
Lymphs Abs: 1.7 K/uL (ref 0.7–4.0)
MCHC: 34.5 g/dL (ref 30.0–36.0)
MCV: 84.2 fl (ref 78.0–100.0)
Monocytes Absolute: 0.6 K/uL (ref 0.1–1.0)
Monocytes Relative: 8.1 % (ref 3.0–12.0)
Neutro Abs: 4.2 K/uL (ref 1.4–7.7)
Neutrophils Relative %: 62.2 % (ref 43.0–77.0)
Platelets: 243 K/uL (ref 150.0–400.0)
RBC: 5.07 Mil/uL (ref 4.22–5.81)
RDW: 13.5 % (ref 11.5–15.5)
WBC: 6.8 K/uL (ref 4.0–10.5)

## 2020-09-10 LAB — BASIC METABOLIC PANEL WITH GFR
BUN: 16 mg/dL (ref 6–23)
CO2: 24 meq/L (ref 19–32)
Calcium: 8.9 mg/dL (ref 8.4–10.5)
Chloride: 103 meq/L (ref 96–112)
Creatinine, Ser: 1.06 mg/dL (ref 0.40–1.50)
GFR: 66.87 mL/min
Glucose, Bld: 97 mg/dL (ref 70–99)
Potassium: 3.9 meq/L (ref 3.5–5.1)
Sodium: 136 meq/L (ref 135–145)

## 2020-09-10 LAB — HEPATIC FUNCTION PANEL
ALT: 17 U/L (ref 0–53)
AST: 22 U/L (ref 0–37)
Albumin: 4.3 g/dL (ref 3.5–5.2)
Alkaline Phosphatase: 54 U/L (ref 39–117)
Bilirubin, Direct: 0.2 mg/dL (ref 0.0–0.3)
Total Bilirubin: 1.5 mg/dL — ABNORMAL HIGH (ref 0.2–1.2)
Total Protein: 6.9 g/dL (ref 6.0–8.3)

## 2020-09-10 LAB — PSA, MEDICARE: PSA: 0.93 ng/ml (ref 0.10–4.00)

## 2020-09-10 LAB — TSH: TSH: 1.49 u[IU]/mL (ref 0.35–4.50)

## 2020-09-10 LAB — HEMOGLOBIN A1C: Hgb A1c MFr Bld: 5.4 % (ref 4.6–6.5)

## 2020-09-12 ENCOUNTER — Ambulatory Visit (INDEPENDENT_AMBULATORY_CARE_PROVIDER_SITE_OTHER): Payer: Medicare HMO | Admitting: Internal Medicine

## 2020-09-12 ENCOUNTER — Other Ambulatory Visit: Payer: Self-pay

## 2020-09-12 DIAGNOSIS — R200 Anesthesia of skin: Secondary | ICD-10-CM

## 2020-09-12 DIAGNOSIS — F419 Anxiety disorder, unspecified: Secondary | ICD-10-CM

## 2020-09-12 DIAGNOSIS — I2510 Atherosclerotic heart disease of native coronary artery without angina pectoris: Secondary | ICD-10-CM

## 2020-09-12 DIAGNOSIS — I10 Essential (primary) hypertension: Secondary | ICD-10-CM | POA: Diagnosis not present

## 2020-09-12 DIAGNOSIS — E7800 Pure hypercholesterolemia, unspecified: Secondary | ICD-10-CM

## 2020-09-12 DIAGNOSIS — G40909 Epilepsy, unspecified, not intractable, without status epilepticus: Secondary | ICD-10-CM | POA: Diagnosis not present

## 2020-09-12 DIAGNOSIS — R739 Hyperglycemia, unspecified: Secondary | ICD-10-CM

## 2020-09-12 DIAGNOSIS — J3489 Other specified disorders of nose and nasal sinuses: Secondary | ICD-10-CM | POA: Diagnosis not present

## 2020-09-12 DIAGNOSIS — K219 Gastro-esophageal reflux disease without esophagitis: Secondary | ICD-10-CM

## 2020-09-12 DIAGNOSIS — L989 Disorder of the skin and subcutaneous tissue, unspecified: Secondary | ICD-10-CM | POA: Diagnosis not present

## 2020-09-12 MED ORDER — TRIAMCINOLONE ACETONIDE 0.1 % EX CREA: 1.0000 "application " | TOPICAL_CREAM | Freq: Two times a day (BID) | CUTANEOUS | 0 refills | Status: DC

## 2020-09-12 MED ORDER — FLUTICASONE PROPIONATE 50 MCG/ACT NA SUSP: 2.0000 | Freq: Every day | NASAL | 1 refills | Status: DC

## 2020-09-12 MED ORDER — BACTROBAN NASAL 2 % NA OINT: 1.0000 "application " | TOPICAL_OINTMENT | Freq: Two times a day (BID) | NASAL | 0 refills | Status: DC

## 2020-09-12 NOTE — Progress Notes (Addendum)
Patient ID: Edwin [name], male   DOB: 05/30/1941, 80 y.o.   MRN: [id number]   Subjective:    Patient ID: Edwin [name], male    DOB: 08-20-1941, 80 y.o.   MRN: [id number]  HPI This visit occurred during the SARS-CoV-2 public health emergency.  Safety protocols were in place, including screening questions prior to the visit, additional usage of staff PPE, and extensive cleaning of exam room while observing appropriate contact time as indicated for disinfecting solutions.  Patient here for a scheduled follow up.  Here to follow up regarding seizures, blood pressure, cholesterol and allergies.  Recently saw neurology 09/06/20.  No break through seizures.  Remains on keppra.  Has noticed occasional raised lesion - itches - leg.  Discussed using cortisone cream.  Also reports right hand/arm numbness.  Notices when sleeps. Once gets up and shakes arm - resolves.  No neck pain or stiffness.  No weakness.  No chest pain or sob reported.  No abdominal pain or bowel change reported.  Nasal lesion.  No fever. No cough or congestion.    Past Medical History:  Diagnosis Date  . Allergy   . Arthritis   . Coronary artery disease   . Diverticulosis of colon   . GERD (gastroesophageal reflux disease)    ESOPHAGEAL REFLUX  . Glaucoma   . Gout   . Hemorrhoids   . History of kidney stones   . Hx of colonic polyp   . Hypercholesterolemia   . Hypertension   . Schatzki's ring   . Seizures ([place])    Past Surgical History:  Procedure Laterality Date  . CARDIAC CATHETERIZATION    . COLONOSCOPY WITH PROPOFOL N/A 05/18/2017   Procedure: COLONOSCOPY WITH PROPOFOL;  Surgeon: Manya Silvas, MD;  Location: [facility] ENDOSCOPY;  Service: Endoscopy;  Laterality: N/A;  . CORONARY ANGIOPLASTY    . EYE SURGERY  07/20/13   cataract  . HERNIA REPAIR Right 2008 & 2012  . VASECTOMY  1984   Family History  Problem Relation Age of Onset  . Arthritis Mother   . Heart disease Mother   . Stroke Mother   .  Hypertension Mother   . Arthritis Father   . Hyperlipidemia Father   . Heart disease Father   . Hypertension Father   . Parkinson's disease Father   . Multiple sclerosis Sister   . Colon cancer Maternal Grandmother   . Arthritis Paternal Grandmother   . Colon cancer Paternal Grandmother    Social History   Socioeconomic History  . Marital status: Married    Spouse name: Not on file  . Number of children: Not on file  . Years of education: Not on file  . Highest education level: Not on file  Occupational History  . Not on file  Tobacco Use  . Smoking status: Never Smoker  . Smokeless tobacco: Never Used  Vaping Use  . Vaping Use: Never used  Substance and Sexual Activity  . Alcohol use: No    Alcohol/week: 0.0 standard drinks  . Drug use: No  . Sexual activity: Never  Other Topics Concern  . Not on file  Social History Narrative  . Not on file   Social Determinants of Health   Financial Resource Strain: Not on file  Food Insecurity: Not on file  Transportation Needs: Not on file  Physical Activity: Not on file  Stress: Not on file  Social Connections: Not on file    Outpatient Encounter Medications as of 09/12/2020  Medication Sig  . mupirocin nasal ointment (BACTROBAN NASAL) 2 % Place 1 application into the nose 2 (two) times daily. Apply topically bid prn  . triamcinolone (KENALOG) 0.1 % Apply 1 application topically 2 (two) times daily.  Marland Kitchen aspirin 81 MG tablet Take 81 mg by mouth at bedtime.  Marland Kitchen azelastine (ASTELIN) 0.1 % nasal spray Place 1 spray into both nostrils 2 (two) times daily.  . Cholecalciferol (VITAMIN D3) 2000 UNITS TABS Take 2,000 Units by mouth daily.   . clopidogrel (PLAVIX) 75 MG tablet Take 75 mg by mouth at bedtime.   . fexofenadine (ALLEGRA ALLERGY) 180 MG tablet Take 180 mg by mouth daily.  . fluticasone (FLONASE) 50 MCG/ACT nasal spray Place 2 sprays into both nostrils daily.  Marland Kitchen latanoprost (XALATAN) 0.005 % ophthalmic solution Place 1  drop into both eyes at bedtime.  . levETIRAcetam (KEPPRA) 500 MG tablet Take 1 tablet (500 mg total) by mouth 2 (two) times daily.  . magnesium oxide (MAG-OX) 400 MG tablet Take 400 mg by mouth at bedtime.  . Multiple Vitamin (MULTIVITAMIN) tablet Take 1 tablet by mouth daily.  Marland Kitchen omeprazole (PRILOSEC) 20 MG capsule TAKE 1 CAPSULE TWICE DAILY BEFORE MEALS (Patient taking differently: Take 40 mg by mouth daily. )  . polyethylene glycol (MIRALAX / GLYCOLAX) 17 g packet Take 17 g by mouth daily.  . Saw Palmetto 450 MG CAPS Take 450 mg by mouth daily.   . simvastatin (ZOCOR) 40 MG tablet Take 40 mg by mouth every evening.   . [DISCONTINUED] fluticasone (FLONASE) 50 MCG/ACT nasal spray Place into the nose.  . [DISCONTINUED] ibuprofen (ADVIL) 800 MG tablet Take 1 tablet (800 mg total) by mouth every 8 (eight) hours as needed.  . [DISCONTINUED] LORazepam (ATIVAN) 0.5 MG tablet Take 1/2 tablet q hs prn  . [DISCONTINUED] Misc Natural Products (TART CHERRY ADVANCED PO) Take 500 mg by mouth every morning.  . [DISCONTINUED] Omega-3 Fatty Acids (FISH OIL) 1200 MG CAPS Take 1,200 mg by mouth 2 (two) times daily.    No facility-administered encounter medications on file as of 09/12/2020.    Review of Systems  Constitutional: Negative for appetite change and unexpected weight change.  HENT: Negative for congestion and sinus pressure.   Respiratory: Negative for cough, chest tightness and shortness of breath.   Cardiovascular: Negative for chest pain, palpitations and leg swelling.  Gastrointestinal: Negative for abdominal pain, diarrhea, nausea and vomiting.  Genitourinary: Negative for difficulty urinating and dysuria.  Musculoskeletal: Negative for joint swelling and myalgias.  Skin: Negative for color change and rash.  Neurological: Negative for dizziness, light-headedness and headaches.  Psychiatric/Behavioral: Negative for agitation and dysphoric mood.       Objective:    Physical Exam Vitals  reviewed.  Constitutional:      General: He is not in acute distress.    Appearance: Normal appearance. He is well-developed and well-nourished.  HENT:     Head: Normocephalic and atraumatic.     Right Ear: External ear normal.     Left Ear: External ear normal.  Eyes:     General: No scleral icterus.       Right eye: No discharge.        Left eye: No discharge.     Conjunctiva/sclera: Conjunctivae normal.  Cardiovascular:     Rate and Rhythm: Normal rate and regular rhythm.  Pulmonary:     Effort: Pulmonary effort is normal. No respiratory distress.     Breath sounds: Normal breath sounds.  Abdominal:     General: Bowel sounds are normal.     Palpations: Abdomen is soft.     Tenderness: There is no abdominal tenderness.  Musculoskeletal:        General: No swelling, tenderness or edema.     Cervical back: Neck supple. No tenderness.  Lymphadenopathy:     Cervical: No cervical adenopathy.  Skin:    Findings: No erythema or rash.  Neurological:     Mental Status: He is alert.  Psychiatric:        Mood and Affect: Mood and affect and mood normal.        Behavior: Behavior normal.     BP 126/72   Pulse 75   Temp 98.7 F (37.1 C) (Oral)   Resp 16   Ht 5' 8"  (1.727 m)   Wt 171 lb (77.6 kg)   SpO2 98%   BMI 26.00 kg/m  Wt Readings from Last 3 Encounters:  09/12/20 171 lb (77.6 kg)  04/25/20 165 lb (74.8 kg)  02/14/20 166 lb (75.3 kg)     Lab Results  Component Value Date   WBC 6.8 09/10/2020   HGB 14.7 09/10/2020   HCT 42.7 09/10/2020   PLT 243.0 09/10/2020   GLUCOSE 97 09/10/2020   CHOL 152 09/10/2020   TRIG 151.0 (H) 09/10/2020   HDL 33.70 (L) 09/10/2020   LDLDIRECT 99.1 07/29/2013   LDLCALC 88 09/10/2020   ALT 17 09/10/2020   AST 22 09/10/2020   NA 136 09/10/2020   K 3.9 09/10/2020   CL 103 09/10/2020   CREATININE 1.06 09/10/2020   BUN 16 09/10/2020   CO2 24 09/10/2020   TSH 1.49 09/10/2020   PSA 0.93 09/10/2020   HGBA1C 5.4 09/10/2020    CT  Head Wo Contrast  Result Date: 08/12/2019 CLINICAL DATA:  Encephalopathy. EXAM: CT HEAD WITHOUT CONTRAST TECHNIQUE: Contiguous axial images were obtained from the base of the skull through the vertex without intravenous contrast. COMPARISON:  MR of head October 09, 2017 FINDINGS: Brain: No evidence of acute infarction, hemorrhage, hydrocephalus, extra-axial collection or mass lesion/mass effect. There is chronic diffuse atrophy. Chronic bilateral periventricular white matter small vessel ischemic changes identified. Small old lacunar infarction is identified in the left basal ganglia. Vascular: No hyperdense vessel is noted. Skull: Normal. Negative for fracture or focal lesion. Sinuses/Orbits: No acute finding. Other: None. IMPRESSION: 1. No focal acute intracranial abnormality identified. 2. Chronic diffuse atrophy. Chronic bilateral periventricular white matter small vessel ischemic change. Electronically Signed   By: Abelardo Diesel M.D.   On: 08/12/2019 13:25       Assessment & Plan:   Problem List Items Addressed This Visit    Anxiety    Doing well. Off lorazepam.  Follow.       CAD (coronary artery disease)    Continue risk factor modification.  Currently asymptomatic.        Essential hypertension, benign    Blood pressure on recheck doing well. On no medication now.  Follow pressures.        Relevant Orders   Basic metabolic panel   GERD (gastroesophageal reflux disease)    No upper symptoms reported. On omeprazole.        Hand numbness    Appears to be c/w carpal tunnel.  Discussed with him.  Trial of wrist splint.  Follow.  Notify me if persistent symptoms.       Hypercholesterolemia    On simvastatin.  Low cholesterol diet and exercise.  Follow lipid panel and liver function tests.        Relevant Orders   Hepatic function panel   Lipid panel   Hyperglycemia    Low carb diet and exercise.  Follow met b and a1c.        Relevant Orders   Hemoglobin A1c   Leg lesion     Triamcinolone cream as directed.  Follow.        Nasal lesion    bactroban as directed.  Notify me if persistent.       Seizure disorder ([place])    On keppra. No seizures.  Follow.           Einar Pheasant, MD

## 2020-09-13 ENCOUNTER — Other Ambulatory Visit: Payer: Self-pay | Admitting: Internal Medicine

## 2020-09-16 ENCOUNTER — Encounter: Payer: Self-pay | Admitting: Internal Medicine

## 2020-09-16 DIAGNOSIS — R200 Anesthesia of skin: Secondary | ICD-10-CM | POA: Insufficient documentation

## 2020-09-16 DIAGNOSIS — J3489 Other specified disorders of nose and nasal sinuses: Secondary | ICD-10-CM | POA: Insufficient documentation

## 2020-09-16 DIAGNOSIS — L989 Disorder of the skin and subcutaneous tissue, unspecified: Secondary | ICD-10-CM | POA: Insufficient documentation

## 2020-09-16 NOTE — Assessment & Plan Note (Signed)
Blood pressure on recheck doing well. On no medication now.  Follow pressures.

## 2020-09-16 NOTE — Assessment & Plan Note (Signed)
Appears to be c/w carpal tunnel.  Discussed with him.  Trial of wrist splint.  Follow.  Notify me if persistent symptoms.

## 2020-09-16 NOTE — Assessment & Plan Note (Signed)
Triamcinolone cream as directed.   Follow.   

## 2020-09-16 NOTE — Assessment & Plan Note (Signed)
Continue risk factor modification.  Currently asymptomatic.  

## 2020-09-16 NOTE — Assessment & Plan Note (Signed)
Doing well. Off lorazepam.  Follow.

## 2020-09-16 NOTE — Assessment & Plan Note (Signed)
On keppra. No seizures.  Follow.

## 2020-09-16 NOTE — Assessment & Plan Note (Signed)
Low carb diet and exercise.  Follow met b and a1c.   

## 2020-09-16 NOTE — Assessment & Plan Note (Signed)
On simvastatin.  Low cholesterol diet and exercise.  Follow lipid panel and liver function tests.   

## 2020-09-16 NOTE — Assessment & Plan Note (Signed)
bactroban as directed.  Notify me if persistent.   

## 2020-09-16 NOTE — Assessment & Plan Note (Signed)
No upper symptoms reported.  On omeprazole.  

## 2020-09-17 ENCOUNTER — Other Ambulatory Visit: Payer: Self-pay | Admitting: Internal Medicine

## 2020-09-20 DIAGNOSIS — E7800 Pure hypercholesterolemia, unspecified: Secondary | ICD-10-CM | POA: Diagnosis not present

## 2020-09-20 DIAGNOSIS — G4733 Obstructive sleep apnea (adult) (pediatric): Secondary | ICD-10-CM | POA: Diagnosis not present

## 2020-09-20 DIAGNOSIS — Z9861 Coronary angioplasty status: Secondary | ICD-10-CM | POA: Diagnosis not present

## 2020-09-20 DIAGNOSIS — I2510 Atherosclerotic heart disease of native coronary artery without angina pectoris: Secondary | ICD-10-CM | POA: Diagnosis not present

## 2020-09-20 DIAGNOSIS — I10 Essential (primary) hypertension: Secondary | ICD-10-CM | POA: Diagnosis not present

## 2020-09-27 ENCOUNTER — Encounter: Payer: Self-pay | Admitting: Internal Medicine

## 2020-09-28 MED ORDER — MUPIROCIN 2 % EX OINT: 1.0000 "application " | TOPICAL_OINTMENT | Freq: Two times a day (BID) | CUTANEOUS | 0 refills | Status: DC

## 2020-09-28 NOTE — Telephone Encounter (Signed)
Please call [facility] - bactroban ointment should not be 2000 dollars.  Please contact pharmacy.  I have multiple pts who get this medication

## 2020-09-28 NOTE — Telephone Encounter (Signed)
Called and spoke to [place] of [employer]. She confirmed that Bactroban nasal does cost 2000 dollars. She states that providers usually only prescribe Bactroban ointment and instruct their patients to use it in the nasal cavity.

## 2020-09-28 NOTE — Telephone Encounter (Signed)
Please notify pt that I sent in new prescription for bactroban that should be much cheaper

## 2020-11-15 DIAGNOSIS — I2510 Atherosclerotic heart disease of native coronary artery without angina pectoris: Secondary | ICD-10-CM | POA: Diagnosis not present

## 2020-11-15 DIAGNOSIS — Z9861 Coronary angioplasty status: Secondary | ICD-10-CM | POA: Diagnosis not present

## 2020-12-05 DIAGNOSIS — R569 Unspecified convulsions: Secondary | ICD-10-CM | POA: Diagnosis not present

## 2020-12-11 DIAGNOSIS — H40053 Ocular hypertension, bilateral: Secondary | ICD-10-CM | POA: Diagnosis not present

## 2020-12-31 ENCOUNTER — Emergency Department
Admission: EM | Admit: 2020-12-31 | Discharge: 2020-12-31 | Disposition: A | Discharge status: Home / Self Care | Payer: Medicare HMO | Attending: Emergency Medicine | Admitting: Emergency Medicine

## 2020-12-31 ENCOUNTER — Emergency Department: Payer: Medicare HMO

## 2020-12-31 ENCOUNTER — Other Ambulatory Visit: Payer: Self-pay

## 2020-12-31 DIAGNOSIS — W228XXA Striking against or struck by other objects, initial encounter: Secondary | ICD-10-CM | POA: Insufficient documentation

## 2020-12-31 DIAGNOSIS — S2241XA Multiple fractures of ribs, right side, initial encounter for closed fracture: Secondary | ICD-10-CM | POA: Diagnosis not present

## 2020-12-31 DIAGNOSIS — Z7982 Long term (current) use of aspirin: Secondary | ICD-10-CM | POA: Diagnosis not present

## 2020-12-31 DIAGNOSIS — Y990 Civilian activity done for income or pay: Secondary | ICD-10-CM | POA: Insufficient documentation

## 2020-12-31 DIAGNOSIS — S3991XA Unspecified injury of abdomen, initial encounter: Secondary | ICD-10-CM | POA: Diagnosis not present

## 2020-12-31 DIAGNOSIS — W19XXXA Unspecified fall, initial encounter: Secondary | ICD-10-CM

## 2020-12-31 DIAGNOSIS — I10 Essential (primary) hypertension: Secondary | ICD-10-CM | POA: Diagnosis not present

## 2020-12-31 DIAGNOSIS — K3189 Other diseases of stomach and duodenum: Secondary | ICD-10-CM | POA: Diagnosis not present

## 2020-12-31 DIAGNOSIS — K449 Diaphragmatic hernia without obstruction or gangrene: Secondary | ICD-10-CM | POA: Diagnosis not present

## 2020-12-31 DIAGNOSIS — S299XXA Unspecified injury of thorax, initial encounter: Secondary | ICD-10-CM | POA: Diagnosis not present

## 2020-12-31 DIAGNOSIS — Y92094 Garage of other non-institutional residence as the place of occurrence of the external cause: Secondary | ICD-10-CM | POA: Diagnosis not present

## 2020-12-31 DIAGNOSIS — I2510 Atherosclerotic heart disease of native coronary artery without angina pectoris: Secondary | ICD-10-CM | POA: Diagnosis not present

## 2020-12-31 DIAGNOSIS — J9859 Other diseases of mediastinum, not elsewhere classified: Secondary | ICD-10-CM | POA: Diagnosis not present

## 2020-12-31 DIAGNOSIS — Z7902 Long term (current) use of antithrombotics/antiplatelets: Secondary | ICD-10-CM | POA: Diagnosis not present

## 2020-12-31 LAB — COMPREHENSIVE METABOLIC PANEL
ALT: 22 U/L (ref 0–44)
AST: 27 U/L (ref 15–41)
Albumin: 4.1 g/dL (ref 3.5–5.0)
Alkaline Phosphatase: 53 U/L (ref 38–126)
Anion gap: 8 (ref 5–15)
BUN: 16 mg/dL (ref 8–23)
CO2: 24 mmol/L (ref 22–32)
Calcium: 9.2 mg/dL (ref 8.9–10.3)
Chloride: 107 mmol/L (ref 98–111)
Creatinine, Ser: 1.01 mg/dL (ref 0.61–1.24)
GFR, Estimated: >60 mL/min (ref >60)
Glucose, Bld: 117 mg/dL — ABNORMAL HIGH (ref 70–99)
Potassium: 3.7 mmol/L (ref 3.5–5.1)
Sodium: 139 mmol/L (ref 135–145)
Total Bilirubin: 1.3 mg/dL — ABNORMAL HIGH (ref 0.3–1.2)
Total Protein: 7.4 g/dL (ref 6.5–8.1)

## 2020-12-31 LAB — CBC
HCT: 42.2 % (ref 39.0–52.0)
Hemoglobin: 14.4 g/dL (ref 13.0–17.0)
MCH: 29.3 pg (ref 26.0–34.0)
MCHC: 34.1 g/dL (ref 30.0–36.0)
MCV: 85.8 fL (ref 80.0–100.0)
Platelets: 211 10*3/uL (ref 150–400)
RBC: 4.92 MIL/uL (ref 4.22–5.81)
RDW: 12.7 % (ref 11.5–15.5)
WBC: 7.6 10*3/uL (ref 4.0–10.5)
nRBC: 0 % (ref 0.0–0.2)

## 2020-12-31 LAB — URINALYSIS, COMPLETE (UACMP) WITH MICROSCOPIC
Bacteria, UA: NONE SEEN
Bilirubin Urine: NEGATIVE
Glucose, UA: NEGATIVE mg/dL
Hgb urine dipstick: NEGATIVE
Ketones, ur: NEGATIVE mg/dL
Leukocytes,Ua: NEGATIVE
Nitrite: NEGATIVE
Protein, ur: NEGATIVE mg/dL
Specific Gravity, Urine: 1.013 (ref 1.005–1.030)
Squamous Epithelial / HPF: NONE SEEN (ref 0–5)
pH: 9 — ABNORMAL HIGH (ref 5.0–8.0)

## 2020-12-31 MED ORDER — ACETAMINOPHEN 500 MG PO TABS
1000.0000 mg | ORAL_TABLET | Freq: Once | ORAL | Status: AC
  Administered 2020-12-31: 1000 mg via ORAL
  Filled 2020-12-31: qty 2

## 2020-12-31 MED ORDER — LIDOCAINE 5 % EX PTCH: 1.0000 | MEDICATED_PATCH | Freq: Two times a day (BID) | CUTANEOUS | 1 refills | Status: DC

## 2020-12-31 MED ORDER — METHOCARBAMOL 500 MG PO TABS: 500.0000 mg | ORAL_TABLET | Freq: Three times a day (TID) | ORAL | 0 refills | Status: DC | PRN

## 2020-12-31 MED ORDER — LIDOCAINE 5 % EX PTCH
1.0000 | MEDICATED_PATCH | CUTANEOUS | Status: DC
  Administered 2020-12-31: 1 via TRANSDERMAL
  Filled 2020-12-31 (×2): qty 1

## 2020-12-31 MED ORDER — TRAMADOL HCL 50 MG PO TABS
50.0000 mg | ORAL_TABLET | Freq: Once | ORAL | Status: AC
  Administered 2020-12-31: 50 mg via ORAL
  Filled 2020-12-31: qty 1

## 2020-12-31 NOTE — Discharge Instructions (Signed)
As we discussed, looks like you have a couple broken ribs on the right side causing your pain.  No signs of internal bleeding or further problems from your fall.  Use Tylenol for pain and fevers.  Up to 1000 mg per dose, up to 4 times per day.  Do not take more than 4000 mg of Tylenol/acetaminophen within 24 hours..  Will be discharged with 2 prescriptions. Lidocaine patches to use over your right side at the site of pain.  Apply 1 patch per time, leave on for 12 hours, remove for 12 hours before applying the next 1.  12 hours on, 12 hours off.  Also being discharged with a muscle relaxer called Robaxin.  It is safe to use this in combination with Tylenol and lidocaine patches.  Use 3-4 times per day as needed.  Use the incentive spirometer plastic breathing device to help take big breaths and to measure your breaths.  Use this frequently throughout the day, such as every 15 minutes or every commercial if you are watching television.  It is important to take deep breaths and open up your lungs to help prevent pneumonia.  Follow-up with your regular doctor to ensure that you are improving appropriately.  You develop any fevers, inability to breathe, uncontrolled pain, please return to the ED.

## 2020-12-31 NOTE — ED Provider Notes (Signed)
[facility] Emergency Department Provider Note  ____________________________________________  Time seen: Approximately 5:56 AM  I have reviewed the triage vital signs and the nursing notes.   HISTORY  Chief Complaint Rib Injury   HPI Edwin [name] is a 80 y.o. male with a history of CAD status post stent on Plavix, hypertension, hyperlipidemia, kidney stones, GERD who presents for evaluation of right flank pain.  Patient reports that 5 days ago he was rearranging things in his garage when he backed into a treadmill and hit the right flank onto the handle.  He had some pain at that time but by that evening he reports that the pain was improved.  Throughout the last several days patient reports that he had minimal discomfort in that area at nighttime when sleeping but no real pain.  Yesterday he vacuumed his entire house and started to feel a grabbing pain in the location of the original trauma but nothing severe.  He woke up at 2 in the morning to go to the bathroom.  When he tried to get up from the bed he reports that the pain on the right flank became severe.  He reports that he has no pain sitting there but does develop severe pain when he is trying to go from laying to standing or with walking or twisting of the torso.  He denies hematuria, he reports that the pain is nonpleuritic in nature, he has no shortness of breath or chest pain.  He denies abdominal pain nausea or vomiting.  He has not taken anything at home for the pain.  He describes the pain as sharp.   He denies history of PE or DVT, recent travel immobilization, leg pain or swelling, hemoptysis, exogenous hormones  Past Medical History:  Diagnosis Date  . Allergy   . Arthritis   . Coronary artery disease   . Diverticulosis of colon   . GERD (gastroesophageal reflux disease)    ESOPHAGEAL REFLUX  . Glaucoma   . Gout   . Hemorrhoids   . History of kidney stones   . Hx of colonic polyp   .  Hypercholesterolemia   . Hypertension   . Schatzki's ring   . Seizures [facility])     Patient Active Problem List   Diagnosis Date Noted  . Nasal lesion 09/16/2020  . Leg lesion 09/16/2020  . Hand numbness 09/16/2020  . Leg skin lesion, left 01/15/2020  . Anxiety 08/10/2019  . Acute gangrenous cholecystitis 07/19/2019  . Hyperglycemia 09/21/2017  . Decreased taste and smell 09/21/2017  . Health care maintenance 11/12/2014  . Fatigue 04/09/2014  . CAD (coronary artery disease) 12/15/2013  . Arthritis 07/31/2013  . Glaucoma 07/31/2013  . Environmental allergies 07/31/2013  . Hypercholesterolemia 07/31/2013  . Essential hypertension, benign 07/31/2013  . Kidney stones 07/31/2013  . History of colonic polyps 07/31/2013  . Seizure disorder (HCC) 07/31/2013  . GERD (gastroesophageal reflux disease) 07/31/2013  . Gout 07/31/2013    Past Surgical History:  Procedure Laterality Date  . CARDIAC CATHETERIZATION    . COLONOSCOPY WITH PROPOFOL N/A 05/18/2017   Procedure: COLONOSCOPY WITH PROPOFOL;  Surgeon: Scot Jun, MD;  Location: [facility] ENDOSCOPY;  Service: Endoscopy;  Laterality: N/A;  . CORONARY ANGIOPLASTY    . EYE SURGERY  07/20/13   cataract  . HERNIA REPAIR Right 2008 & 2012  . VASECTOMY  1984    Prior to Admission medications   Medication Sig Start Date End Date Taking? Authorizing Provider  aspirin 81 MG tablet Take 81 mg by mouth at bedtime.    [provider]  azelastine (ASTELIN) 0.1 % nasal spray Place 1 spray into both nostrils 2 (two) times daily. 03/19/17   Einar Pheasant, MD  Cholecalciferol (VITAMIN D3) 2000 UNITS TABS Take 2,000 Units by mouth daily.     [provider]  clopidogrel (PLAVIX) 75 MG tablet Take 75 mg by mouth at bedtime.     [provider]  fexofenadine (ALLEGRA ALLERGY) 180 MG tablet Take 180 mg by mouth daily. 09/01/98   [provider]  fluticasone (FLONASE) 50 MCG/ACT nasal spray Place 2 sprays into both  nostrils daily. 09/12/20   Einar Pheasant, MD  latanoprost (XALATAN) 0.005 % ophthalmic solution Place 1 drop into both eyes at bedtime.    [provider]  levETIRAcetam (KEPPRA) 500 MG tablet Take 1 tablet (500 mg total) by mouth 2 (two) times daily. 08/12/19   Earleen Newport, MD  magnesium oxide (MAG-OX) 400 MG tablet Take 400 mg by mouth at bedtime.    [provider]  Multiple Vitamin (MULTIVITAMIN) tablet Take 1 tablet by mouth daily.    [provider]  mupirocin nasal ointment (BACTROBAN NASAL) 2 % Place 1 application into the nose 2 (two) times daily. Apply topically bid prn 09/12/20   Einar Pheasant, MD  mupirocin ointment (BACTROBAN) 2 % Apply 1 application topically 2 (two) times daily. Apply in the nose two times per day. 09/28/20   Einar Pheasant, MD  omeprazole (PRILOSEC) 20 MG capsule TAKE 1 CAPSULE TWICE DAILY BEFORE MEALS 09/17/20   Einar Pheasant, MD  polyethylene glycol (MIRALAX / GLYCOLAX) 17 g packet Take 17 g by mouth daily.    [provider]  Saw Palmetto 450 MG CAPS Take 450 mg by mouth daily.     [provider]  simvastatin (ZOCOR) 40 MG tablet Take 40 mg by mouth every evening.     [provider]  triamcinolone (KENALOG) 0.1 % Apply 1 application topically 2 (two) times daily. 09/12/20   Einar Pheasant, MD    Allergies Penicillins and Sulfa antibiotics  Family History  Problem Relation Age of Onset  . Arthritis Mother   . Heart disease Mother   . Stroke Mother   . Hypertension Mother   . Arthritis Father   . Hyperlipidemia Father   . Heart disease Father   . Hypertension Father   . Parkinson's disease Father   . Multiple sclerosis Sister   . Colon cancer Maternal Grandmother   . Arthritis Paternal Grandmother   . Colon cancer Paternal Grandmother     Social History Social History   Tobacco Use  . Smoking status: Never Smoker  . Smokeless tobacco: Never Used  Vaping Use  . Vaping Use:  Never used  Substance Use Topics  . Alcohol use: No    Alcohol/week: 0.0 standard drinks  . Drug use: No    Review of Systems  Constitutional: Negative for fever. Eyes: Negative for visual changes. ENT: Negative for sore throat. Neck: No neck pain  Cardiovascular: Negative for chest pain. Respiratory: Negative for shortness of breath. Gastrointestinal: Negative for abdominal pain, vomiting or diarrhea. Genitourinary: Negative for dysuria. + R flank pain Musculoskeletal: Negative for back pain. Skin: Negative for rash. Neurological: Negative for headaches, weakness or numbness. Psych: No SI or HI  ____________________________________________   PHYSICAL EXAM:  VITAL SIGNS: ED Triage Vitals  Enc Vitals Group     BP 12/31/20 0545 Marland Kitchen)  166/93     Pulse Rate 12/31/20 0545 83     Resp 12/31/20 0545 18     Temp 12/31/20 0545 98 F (36.7 C)     Temp Source 12/31/20 0545 Oral     SpO2 12/31/20 0545 97 %     Weight 12/31/20 0550 165 lb (74.8 kg)     Height 12/31/20 0550 5\' 8"  (1.727 m)     Head Circumference --      Peak Flow --      Pain Score 12/31/20 0549 2     Pain Loc --      Pain Edu? --      Excl. in [place]? --     Constitutional: Alert and oriented. Well appearing and in no apparent distress. HEENT:      Head: Normocephalic and atraumatic.         Eyes: Conjunctivae are normal. Sclera is non-icteric.       Mouth/Throat: Mucous membranes are moist.       Neck: Supple with no signs of meningismus. Cardiovascular: Regular rate and rhythm. No murmurs, gallops, or rubs. 2+ symmetrical distal pulses are present in all extremities. No JVD. Chest wall: patient with tenderness to palpation over the right lateral/posterior rib cage with no bruising or deformity. Respiratory: Normal respiratory effort. Lungs are clear to auscultation bilaterally.  Gastrointestinal: Soft, non tender, and non distended. Genitourinary: No CVA tenderness. Musculoskeletal:  No edema, cyanosis, or  erythema of extremities. Neurologic: Normal speech and language. Face is symmetric. Moving all extremities. No gross focal neurologic deficits are appreciated. Skin: Skin is warm, dry and intact. No rash noted. Psychiatric: Mood and affect are normal. Speech and behavior are normal.  ____________________________________________   LABS (all labs ordered are listed, but only abnormal results are displayed)  Labs Reviewed  CBC  URINALYSIS, COMPLETE (UACMP) WITH MICROSCOPIC  COMPREHENSIVE METABOLIC PANEL   ____________________________________________  EKG  ED ECG REPORT I, Rudene Re, the attending physician, personally viewed and interpreted this ECG.  Sinus rhythm with a rate of 68, first-degree AV block, right bundle branch block, LAFB, LVH, no ST elevations or depressions.  Unchanged from prior.   ____________________________________________  RADIOLOGY  I have personally reviewed the images performed during this visit and I agree with the Radiologist's read.   Interpretation by Radiologist:  DG Ribs Unilateral W/Chest Right  Result Date: 12/31/2020 CLINICAL DATA:  Struck chest wall on handle of treadmill on Wednesday EXAM: RIGHT RIBS AND CHEST - 3+ VIEW COMPARISON:  None. FINDINGS: Some abrupt cortical angulation noted along the posterolateral sixth through eighth right ribs which could reflect some minimally displaced rib fractures in the setting of acute chest wall trauma. No associated pneumothorax or visible effusion. No focal consolidative process or convincing edema. The cardiomediastinal contours are unremarkable. No other acute osseous or soft tissue abnormalities. IMPRESSION: Abrupt angulation of the posterolateral sixth through eighth ribs, could reflect minimally displaced fractures. No associated pneumothorax or effusion. Electronically Signed   By: Lovena Le M.D.   On: 12/31/2020 06:26       ____________________________________________   PROCEDURES  Procedure(s) performed:yes  .1-3 Lead EKG Interpretation Performed by: Rudene Re, MD Authorized by: Rudene Re, MD     Interpretation: abnormal     ECG rate assessment: normal     Rhythm: sinus rhythm     Ectopy: none     Conduction: abnormal     Critical Care performed:  None ____________________________________________   INITIAL IMPRESSION / ASSESSMENT AND PLAN /  ED COURSE  81 y.o. male with a history of CAD status post stent on Plavix, hypertension, hyperlipidemia, kidney stones, GERD who presents for evaluation of right flank pain/ right lateral/posterior rib cage pain.  Pain is reproducible on palpation with no bruising or deformities, lungs are clear to auscultation bilaterally, abdomen is soft and nontender.  Vitals are within normal limits with normal work of breathing, normal sats, no tachypnea, tachycardia or hypoxia.  Ddx contusion, rib fracture, kidney stone, renal trauma, UTI.  Will get EKG, CXR, urinalysis, lab work.  Will treat with Tylenol, tramadol, Lidoderm patch.  Old medical records reviewed.  _________________________ 6:59 AM on 12/31/2020 -----------------------------------------  Chest x-ray with possible posterior lateral sixth through eighth rib fractures.  No signs of pneumothorax or hemothorax.  However since patient is on a blood thinner we will do a CT of the chest/abdomen/pelvis to rule out internal bleed. Patient given incentive spirometer. If CT negatives for any other acute traumatic injuries and pain is well controlled we will plan to discharge home with incentive spirometer, pain control, and follow-up with primary care doctor.  Otherwise patient be admitted to the hospitalist.  Care transferred to Dr. Vladimir Crofts.      _____________________________________________ Please note:  Patient was evaluated in Emergency Department today for the symptoms described in the  history of present illness. Patient was evaluated in the context of the global COVID-19 pandemic, which necessitated consideration that the patient might be at risk for infection with the SARS-CoV-2 virus that causes COVID-19. Institutional protocols and algorithms that pertain to the evaluation of patients at risk for COVID-19 are in a state of rapid change based on information released by regulatory bodies including the CDC and federal and state organizations. These policies and algorithms were followed during the patient's care in the ED.  Some ED evaluations and interventions may be delayed as a result of limited staffing during the pandemic.   [place] Controlled Substance Database was reviewed by me. ____________________________________________   FINAL CLINICAL IMPRESSION(S) / ED DIAGNOSES   Final diagnoses:  Closed fracture of multiple ribs of right side, initial encounter  Chest wall trauma      NEW MEDICATIONS STARTED DURING THIS VISIT:  ED Discharge Orders    None       Note:  This document was prepared using Dragon voice recognition software and may include unintentional dictation errors.    Rudene Re, MD 12/31/20 [phone]

## 2020-12-31 NOTE — ED Provider Notes (Signed)
  Patient received in signout from Dr. Alfred Levins pending CT imaging for evaluation of traumatic injury after a fall.  CT reviewed with isolated rib fracture on the right without evidence of underlying pathology.  I reassessed the patient and he has normal vital signs, well controlled pain and looks well clinically.  We discussed management of rib fractures at home with nonnarcotic multimodal analgesia.  We discussed incentive spirometer use.  We discussed following up with his PCP discussed return precautions for the ED.  Patient stable for outpatient management.   Vladimir Crofts, MD 12/31/20 [phone]

## 2020-12-31 NOTE — ED Notes (Signed)
MD at bedside, no MSE signed.

## 2020-12-31 NOTE — ED Triage Notes (Signed)
Pt states he was working in garage on Wednesday when he bumped into a handle of his treadmill. Pt states he struck his lateral to posterior right rib cage. Pt states area is not painful and is having increased pain with movement. Pt denies shob.

## 2020-12-31 NOTE — ED Notes (Signed)
AAOx3.  Skin warm and dry.  NAD 

## 2021-01-08 ENCOUNTER — Other Ambulatory Visit (INDEPENDENT_AMBULATORY_CARE_PROVIDER_SITE_OTHER): Payer: Medicare HMO

## 2021-01-08 ENCOUNTER — Other Ambulatory Visit: Payer: Self-pay

## 2021-01-08 DIAGNOSIS — R739 Hyperglycemia, unspecified: Secondary | ICD-10-CM | POA: Diagnosis not present

## 2021-01-08 DIAGNOSIS — E7800 Pure hypercholesterolemia, unspecified: Secondary | ICD-10-CM

## 2021-01-08 DIAGNOSIS — I10 Essential (primary) hypertension: Secondary | ICD-10-CM | POA: Diagnosis not present

## 2021-01-08 LAB — LIPID PANEL
Cholesterol: 145 mg/dL (ref 0–200)
HDL: 30.8 mg/dL — ABNORMAL LOW (ref >39.00)
LDL Cholesterol: 77 mg/dL (ref 0–99)
NonHDL: 114.42
Total CHOL/HDL Ratio: 5
Triglycerides: 187 mg/dL — ABNORMAL HIGH (ref 0.0–149.0)
VLDL: 37.4 mg/dL (ref 0.0–40.0)

## 2021-01-08 LAB — HEPATIC FUNCTION PANEL
ALT: 25 U/L (ref 0–53)
AST: 26 U/L (ref 0–37)
Albumin: 4.2 g/dL (ref 3.5–5.2)
Alkaline Phosphatase: 67 U/L (ref 39–117)
Bilirubin, Direct: 0.2 mg/dL (ref 0.0–0.3)
Total Bilirubin: 0.8 mg/dL (ref 0.2–1.2)
Total Protein: 7.4 g/dL (ref 6.0–8.3)

## 2021-01-08 LAB — BASIC METABOLIC PANEL
BUN: 17 mg/dL (ref 6–23)
CO2: 27 mEq/L (ref 19–32)
Calcium: 9.5 mg/dL (ref 8.4–10.5)
Chloride: 102 mEq/L (ref 96–112)
Creatinine, Ser: 1.13 mg/dL (ref 0.40–1.50)
GFR: 61.79 mL/min (ref >60.00)
Glucose, Bld: 112 mg/dL — ABNORMAL HIGH (ref 70–99)
Potassium: 3.9 mEq/L (ref 3.5–5.1)
Sodium: 136 mEq/L (ref 135–145)

## 2021-01-08 LAB — HEMOGLOBIN A1C: Hgb A1c MFr Bld: 5.5 % (ref 4.6–6.5)

## 2021-01-10 ENCOUNTER — Other Ambulatory Visit: Payer: Self-pay

## 2021-01-10 ENCOUNTER — Ambulatory Visit (INDEPENDENT_AMBULATORY_CARE_PROVIDER_SITE_OTHER): Payer: Medicare HMO | Admitting: Internal Medicine

## 2021-01-10 VITALS — BP 128/72 | HR 79 | Temp 97.4°F | Resp 16 | Ht 68.0 in | Wt 171.0 lb

## 2021-01-10 DIAGNOSIS — Z0000 Encounter for general adult medical examination without abnormal findings: Secondary | ICD-10-CM | POA: Diagnosis not present

## 2021-01-10 DIAGNOSIS — K219 Gastro-esophageal reflux disease without esophagitis: Secondary | ICD-10-CM | POA: Diagnosis not present

## 2021-01-10 DIAGNOSIS — Z1159 Encounter for screening for other viral diseases: Secondary | ICD-10-CM | POA: Diagnosis not present

## 2021-01-10 DIAGNOSIS — S2239XD Fracture of one rib, unspecified side, subsequent encounter for fracture with routine healing: Secondary | ICD-10-CM

## 2021-01-10 DIAGNOSIS — I700 Atherosclerosis of aorta: Secondary | ICD-10-CM

## 2021-01-10 DIAGNOSIS — E7800 Pure hypercholesterolemia, unspecified: Secondary | ICD-10-CM | POA: Diagnosis not present

## 2021-01-10 DIAGNOSIS — R739 Hyperglycemia, unspecified: Secondary | ICD-10-CM

## 2021-01-10 DIAGNOSIS — G40909 Epilepsy, unspecified, not intractable, without status epilepticus: Secondary | ICD-10-CM

## 2021-01-10 DIAGNOSIS — I2510 Atherosclerotic heart disease of native coronary artery without angina pectoris: Secondary | ICD-10-CM | POA: Diagnosis not present

## 2021-01-10 DIAGNOSIS — Z9109 Other allergy status, other than to drugs and biological substances: Secondary | ICD-10-CM

## 2021-01-10 MED ORDER — LEVOCETIRIZINE DIHYDROCHLORIDE 5 MG PO TABS: 5.0000 mg | ORAL_TABLET | Freq: Every evening | ORAL | 1 refills | Status: DC

## 2021-01-10 NOTE — Progress Notes (Signed)
Patient ID: Edwin [name], male   DOB: May 20, 1941, 80 y.o.   MRN: [id number]   Subjective:    Patient ID: Edwin [name], male    DOB: 03/23/1941, 80 y.o.   MRN: [id number]  HPI This visit occurred during the SARS-CoV-2 public health emergency.  Safety protocols were in place, including screening questions prior to the visit, additional usage of staff PPE, and extensive cleaning of exam room while observing appropriate contact time as indicated for disinfecting solutions.  Patient here for his physical.  Recently ran into his treadmill.  Evaluated in ER 12/31/20 - diagnosed with rib fracture.  No significant pain now.  Specifically denies any pain with deep breathing.  No increased cough or congestion.  No sob.  No chest pain.  Saw neurology 12/05/20 - keppra changed to XR 1000 q hs.  No further seizures.  No acid reflux or abdominal pain reported.  miralax helps to keep his bowels regular.  Occasionally will take prn dulcolax.  Request rx for xyzal.  Allergies - relatively stable.    Past Medical History:  Diagnosis Date  . Allergy   . Arthritis   . Coronary artery disease   . Diverticulosis of colon   . GERD (gastroesophageal reflux disease)    ESOPHAGEAL REFLUX  . Glaucoma   . Gout   . Hemorrhoids   . History of kidney stones   . Hx of colonic polyp   . Hypercholesterolemia   . Hypertension   . Schatzki's ring   . Seizures ([place])    Past Surgical History:  Procedure Laterality Date  . CARDIAC CATHETERIZATION    . COLONOSCOPY WITH PROPOFOL N/A 05/18/2017   Procedure: COLONOSCOPY WITH PROPOFOL;  Surgeon: Manya Silvas, MD;  Location: [facility] ENDOSCOPY;  Service: Endoscopy;  Laterality: N/A;  . CORONARY ANGIOPLASTY    . EYE SURGERY  07/20/13   cataract  . HERNIA REPAIR Right 2008 & 2012  . VASECTOMY  1984   Family History  Problem Relation Age of Onset  . Arthritis Mother   . Heart disease Mother   . Stroke Mother   . Hypertension Mother   . Arthritis Father   .  Hyperlipidemia Father   . Heart disease Father   . Hypertension Father   . Parkinson's disease Father   . Multiple sclerosis Sister   . Colon cancer Maternal Grandmother   . Arthritis Paternal Grandmother   . Colon cancer Paternal Grandmother    Social History   Socioeconomic History  . Marital status: Married    Spouse name: Not on file  . Number of children: Not on file  . Years of education: Not on file  . Highest education level: Not on file  Occupational History  . Not on file  Tobacco Use  . Smoking status: Never Smoker  . Smokeless tobacco: Never Used  Vaping Use  . Vaping Use: Never used  Substance and Sexual Activity  . Alcohol use: No    Alcohol/week: 0.0 standard drinks  . Drug use: No  . Sexual activity: Never  Other Topics Concern  . Not on file  Social History Narrative  . Not on file   Social Determinants of Health   Financial Resource Strain: Not on file  Food Insecurity: Not on file  Transportation Needs: Not on file  Physical Activity: Not on file  Stress: Not on file  Social Connections: Not on file    Outpatient Encounter Medications as of 01/10/2021  Medication Sig  .  levETIRAcetam (KEPPRA XR) 500 MG 24 hr tablet Take 1,000 mg by mouth daily.  Marland Kitchen levocetirizine (XYZAL) 5 MG tablet Take 1 tablet (5 mg total) by mouth every evening.  Marland Kitchen aspirin 81 MG tablet Take 81 mg by mouth at bedtime.  Marland Kitchen azelastine (ASTELIN) 0.1 % nasal spray Place 1 spray into both nostrils 2 (two) times daily.  . Cholecalciferol (VITAMIN D3) 2000 UNITS TABS Take 2,000 Units by mouth daily.   . clopidogrel (PLAVIX) 75 MG tablet Take 75 mg by mouth at bedtime.   . fluticasone (FLONASE) 50 MCG/ACT nasal spray Place 2 sprays into both nostrils daily.  Marland Kitchen latanoprost (XALATAN) 0.005 % ophthalmic solution Place 1 drop into both eyes at bedtime.  . magnesium oxide (MAG-OX) 400 MG tablet Take 400 mg by mouth at bedtime.  . Multiple Vitamin (MULTIVITAMIN) tablet Take 1 tablet by  mouth daily.  . mupirocin ointment (BACTROBAN) 2 % Apply 1 application topically 2 (two) times daily. Apply in the nose two times per day.  Marland Kitchen omeprazole (PRILOSEC) 20 MG capsule TAKE 1 CAPSULE TWICE DAILY BEFORE MEALS  . polyethylene glycol (MIRALAX / GLYCOLAX) 17 g packet Take 17 g by mouth daily.  . Saw Palmetto 450 MG CAPS Take 450 mg by mouth daily.   . simvastatin (ZOCOR) 40 MG tablet Take 40 mg by mouth every evening.   . triamcinolone (KENALOG) 0.1 % Apply 1 application topically 2 (two) times daily.  . [DISCONTINUED] fexofenadine (ALLEGRA ALLERGY) 180 MG tablet Take 180 mg by mouth daily.  . [DISCONTINUED] levETIRAcetam (KEPPRA) 500 MG tablet Take 1 tablet (500 mg total) by mouth 2 (two) times daily.  . [DISCONTINUED] lidocaine (LIDODERM) 5 % Place 1 patch onto the skin every 12 (twelve) hours. Remove & Discard patch within 12 hours or as directed by MD (Patient not taking: Reported on 01/10/2021)  . [DISCONTINUED] methocarbamol (ROBAXIN) 500 MG tablet Take 1 tablet (500 mg total) by mouth every 8 (eight) hours as needed for muscle spasms (severe pain). (Patient not taking: Reported on 01/10/2021)  . [DISCONTINUED] mupirocin nasal ointment (BACTROBAN NASAL) 2 % Place 1 application into the nose 2 (two) times daily. Apply topically bid prn (Patient not taking: Reported on 01/10/2021)   No facility-administered encounter medications on file as of 01/10/2021.    Review of Systems  Constitutional: Negative for appetite change and unexpected weight change.  HENT: Negative for congestion, sinus pressure and sore throat.   Eyes: Negative for pain and visual disturbance.  Respiratory: Negative for cough, chest tightness and shortness of breath.   Cardiovascular: Negative for chest pain, palpitations and leg swelling.  Gastrointestinal: Negative for abdominal pain, diarrhea, nausea and vomiting.  Genitourinary: Negative for difficulty urinating and dysuria.  Musculoskeletal: Negative for joint  swelling and myalgias.  Skin: Negative for color change and rash.  Neurological: Negative for dizziness, light-headedness and headaches.  Hematological: Negative for adenopathy. Does not bruise/bleed easily.  Psychiatric/Behavioral: Negative for agitation and dysphoric mood.       Objective:    Physical Exam Vitals reviewed.  Constitutional:      General: He is not in acute distress.    Appearance: Normal appearance. He is well-developed.  HENT:     Head: Normocephalic and atraumatic.     Right Ear: External ear normal.     Left Ear: External ear normal.  Eyes:     General: No scleral icterus.       Right eye: No discharge.  Left eye: No discharge.     Conjunctiva/sclera: Conjunctivae normal.  Neck:     Thyroid: No thyromegaly.  Cardiovascular:     Rate and Rhythm: Normal rate and regular rhythm.  Pulmonary:     Effort: No respiratory distress.     Breath sounds: Normal breath sounds. No wheezing.  Abdominal:     General: Bowel sounds are normal.     Palpations: Abdomen is soft.     Tenderness: There is no abdominal tenderness.  Musculoskeletal:        General: No swelling or tenderness.     Cervical back: Neck supple. No tenderness.  Lymphadenopathy:     Cervical: No cervical adenopathy.  Skin:    General: Skin is dry.     Findings: No erythema or rash.  Neurological:     Mental Status: He is alert and oriented to person, place, and time.  Psychiatric:        Mood and Affect: Mood normal.        Behavior: Behavior normal.     BP 128/72   Pulse 79   Temp (!) 97.4 F (36.3 C) (Temporal)   Resp 16   Ht 5' 8"  (1.727 m)   Wt 171 lb (77.6 kg)   SpO2 98%   BMI 26.00 kg/m  Wt Readings from Last 3 Encounters:  01/10/21 171 lb (77.6 kg)  12/31/20 165 lb (74.8 kg)  09/12/20 171 lb (77.6 kg)     Lab Results  Component Value Date   WBC 7.6 12/31/2020   HGB 14.4 12/31/2020   HCT 42.2 12/31/2020   PLT 211 12/31/2020   GLUCOSE 112 (H) 01/08/2021    CHOL 145 01/08/2021   TRIG 187.0 (H) 01/08/2021   HDL 30.80 (L) 01/08/2021   LDLDIRECT 99.1 07/29/2013   LDLCALC 77 01/08/2021   ALT 25 01/08/2021   AST 26 01/08/2021   NA 136 01/08/2021   K 3.9 01/08/2021   CL 102 01/08/2021   CREATININE 1.13 01/08/2021   BUN 17 01/08/2021   CO2 27 01/08/2021   TSH 1.49 09/10/2020   PSA 0.93 09/10/2020   HGBA1C 5.5 01/08/2021    DG Ribs Unilateral W/Chest Right  Result Date: 12/31/2020 CLINICAL DATA:  Struck chest wall on handle of treadmill on Wednesday EXAM: RIGHT RIBS AND CHEST - 3+ VIEW COMPARISON:  None. FINDINGS: Some abrupt cortical angulation noted along the posterolateral sixth through eighth right ribs which could reflect some minimally displaced rib fractures in the setting of acute chest wall trauma. No associated pneumothorax or visible effusion. No focal consolidative process or convincing edema. The cardiomediastinal contours are unremarkable. No other acute osseous or soft tissue abnormalities. IMPRESSION: Abrupt angulation of the posterolateral sixth through eighth ribs, could reflect minimally displaced fractures. No associated pneumothorax or effusion. Electronically Signed   By: Lovena Le M.D.   On: 12/31/2020 06:26   CT CHEST ABDOMEN PELVIS WO CONTRAST  Result Date: 12/31/2020 CLINICAL DATA:  80 year old male status post blunt trauma last week. Increased pain with movement. EXAM: CT CHEST, ABDOMEN AND PELVIS WITHOUT CONTRAST TECHNIQUE: Multidetector CT imaging of the chest, abdomen and pelvis was performed following the standard protocol without IV contrast. COMPARISON:  Chest and right rib series earlier today. CT Abdomen and Pelvis 07/18/2019. FINDINGS: CT CHEST FINDINGS Cardiovascular: Normal caliber of the thoracic aorta with mild calcified atherosclerosis. No evidence of periaortic hematoma on this noncontrast exam. Cardiac size within normal limits. Calcified coronary artery atherosclerosis. No pericardial effusion. Vascular  patency  is not evaluated in the absence of IV contrast. Mediastinum/Nodes: Small chronic hiatal hernia. No mediastinal hematoma or lymphadenopathy in the absence of IV contrast. Lungs/Pleura: Major airways are patent. Improved lung base ventilation compared to 2020. Mild residual scarring or atelectasis on the right. No pneumothorax, pleural effusion, pulmonary contusion. Musculoskeletal: Osteopenia. Minimally displaced fracture right posterolateral 9th rib (series 4, image 122. Otherwise there is only subtle angulation of the right lateral 5th through 8th ribs, indeterminate for nondisplaced fractures. Intact sternum. Visible shoulder osseous structures appear intact. Mild lower thoracic endplate deformities appear stable since 2020. No convincing acute thoracic vertebral fracture. CT ABDOMEN PELVIS FINDINGS Hepatobiliary: Absent gallbladder now. Negative noncontrast liver. No perihepatic fluid. Pancreas: Negative. Spleen: Negative noncontrast spleen.  No perisplenic fluid. Adrenals/Urinary Tract: Normal adrenal glands. Noncontrast kidneys appear stable since 2020 and nonobstructed. Small chronic right renal upper pole cyst. Decompressed and negative ureters. Unremarkable urinary bladder. Stomach/Bowel: Extensive diverticulosis from the descending through the sigmoid colon. No active inflammation. Otherwise redundant large bowel with intermittent retained stool. Mild diverticula in the right colon. Normal appendix on coronal image 78. Decompressed and negative terminal ileum. No dilated small bowel. Decompressed stomach and duodenum. No free air, free fluid, mesenteric inflammation. Vascular/Lymphatic: Aortoiliac calcified atherosclerosis. Abdominal aortic caliber remains within normal limits. Vascular patency is not evaluated in the absence of IV contrast. No lymphadenopathy. Reproductive: Negative. Other: No pelvic free fluid. Musculoskeletal: Lumbar vertebrae, sacrum, SI joints, pelvis and proximal femurs appear  stable and intact. IMPRESSION: 1. Minimally displaced fracture of the right posterolateral 9th rib. Underlying osteopenia. Difficult to exclude nondisplaced rib fractures at several adjacent levels. 2. No other acute traumatic injury identified in the noncontrast chest, abdomen, or pelvis. 3. Cholecystectomy since 2020. Aortic Atherosclerosis (ICD10-I70.0). Small chronic hiatal hernia. Distal large bowel diverticulosis. Electronically Signed   By: Genevie Ann M.D.   On: 12/31/2020 08:08       Assessment & Plan:   Problem List Items Addressed This Visit    Aortic atherosclerosis ([place])    Continue simvastatin.       CAD (coronary artery disease)    Cath as outlined.  Continue risk factor modification.  Currently asymptomatic.        Environmental allergies    Refill xyzal.        GERD (gastroesophageal reflux disease)    Continue omeprazole.  No upper symptoms reported.       Health care maintenance    Physical today 01/10/21.  Colonoscopy 05/2017 - one small rectal and diverticuoosis.  PSA 09/10/20 - .93.       Relevant Orders   SARS-CoV-2 Semi-Quantitative Total Antibody, Spike   Hypercholesterolemia    Continue simvastatin.  Low cholesterol diet and exercise.  Follow lipid panel and liver function tests.       Relevant Orders   Hepatic function panel   Lipid panel   Basic metabolic panel   Hyperglycemia    Low carb diet and exercise.  Follow met b and a1c.       Relevant Orders   Hemoglobin A1c   Rib fracture    Recent injury as outlined.  No pain.  Discussed bone density.  Notify if desires.       Seizure disorder ([place])    Continue keppra - now on keppra XR 1000 qhs.  No further seizures.       Relevant Medications   levETIRAcetam (KEPPRA XR) 500 MG 24 hr tablet    Other Visit Diagnoses    Routine  general medical examination at a health care facility    -  Primary   Need for hepatitis C screening test       Relevant Orders   Hepatitis C antibody (Completed)        Einar Pheasant, MD

## 2021-01-10 NOTE — Assessment & Plan Note (Addendum)
Physical today 01/10/21.  Colonoscopy 05/2017 - one small rectal and diverticuoosis.  PSA 09/10/20 - .93.

## 2021-01-12 ENCOUNTER — Encounter: Payer: Self-pay | Admitting: Internal Medicine

## 2021-01-12 DIAGNOSIS — S2239XA Fracture of one rib, unspecified side, initial encounter for closed fracture: Secondary | ICD-10-CM | POA: Insufficient documentation

## 2021-01-12 DIAGNOSIS — I700 Atherosclerosis of aorta: Secondary | ICD-10-CM | POA: Insufficient documentation

## 2021-01-12 NOTE — Assessment & Plan Note (Signed)
Continue omeprazole.  No upper symptoms reported.

## 2021-01-12 NOTE — Assessment & Plan Note (Signed)
Refill xyzal.

## 2021-01-12 NOTE — Assessment & Plan Note (Signed)
Recent injury as outlined.  No pain.  Discussed bone density.  Notify if desires.

## 2021-01-12 NOTE — Assessment & Plan Note (Signed)
Cath as outlined.  Continue risk factor modification.  Currently asymptomatic.

## 2021-01-12 NOTE — Assessment & Plan Note (Signed)
Low carb diet and exercise.  Follow met b and a1c.  

## 2021-01-12 NOTE — Assessment & Plan Note (Signed)
Continue keppra - now on keppra XR 1000 qhs.  No further seizures.

## 2021-01-12 NOTE — Assessment & Plan Note (Signed)
Continue simvastatin. 

## 2021-01-12 NOTE — Assessment & Plan Note (Signed)
Continue simvastatin.  Low cholesterol diet and exercise.  Follow lipid panel and liver function tests.   

## 2021-01-16 LAB — HEPATITIS C ANTIBODY
Hepatitis C Ab: NONREACTIVE
SIGNAL TO CUT-OFF: 0.01 (ref <1.00)

## 2021-01-16 LAB — SARS-COV-2 SEMI-QUANTITATIVE TOTAL ANTIBODY, SPIKE: SARS COV2 AB, Total Spike Semi QN: 85.1 U/mL — ABNORMAL HIGH (ref <0.8)

## 2021-01-28 ENCOUNTER — Other Ambulatory Visit: Payer: Self-pay | Admitting: Internal Medicine

## 2021-02-14 ENCOUNTER — Ambulatory Visit: Payer: Medicare HMO

## 2021-02-14 ENCOUNTER — Telehealth: Payer: Self-pay

## 2021-02-14 ENCOUNTER — Ambulatory Visit (INDEPENDENT_AMBULATORY_CARE_PROVIDER_SITE_OTHER): Payer: Medicare HMO

## 2021-02-14 VITALS — Ht 68.0 in | Wt 171.0 lb

## 2021-02-14 DIAGNOSIS — Z0000 Encounter for general adult medical examination without abnormal findings: Secondary | ICD-10-CM

## 2021-02-14 NOTE — Patient Instructions (Addendum)
Edwin [name] , Thank you for taking time to come for your Medicare Wellness Visit. I appreciate your ongoing commitment to your health goals. Please review the following plan we discussed and let me know if I can assist you in the future.   These are the goals we discussed:  Goals      Maintain healthy lifestyle     Stay active Stay hydrated Healthy diet         This is a list of the screening recommended for you and due dates:  Health Maintenance  Topic Date Due   COVID-19 Vaccine (3 - Booster for Pfizer series) 03/02/2021*   Flu Shot  04/01/2021   Tetanus Vaccine  07/05/2024   Hepatitis C Screening: USPSTF Recommendation to screen - Ages 18-79 yo.  Completed   Pneumonia vaccines  Completed   Zoster (Shingles) Vaccine  Completed   HPV Vaccine  Aged Out  *Topic was postponed. The date shown is not the original due date.    Advanced directives: End of life planning; Advance aging; Advanced directives discussed.  Copy of current HCPOA/Living Will requested.    Conditions/risks identified: none new  Follow up in one year for your annual wellness visit.   Preventive Care 80 Years and Older, Male Preventive care refers to lifestyle choices and visits with your health care provider that can promote health and wellness. What does preventive care include? A yearly physical exam. This is also called an annual well check. Dental exams once or twice a year. Routine eye exams. Ask your health care provider how often you should have your eyes checked. Personal lifestyle choices, including: Daily care of your teeth and gums. Regular physical activity. Eating a healthy diet. Avoiding tobacco and drug use. Limiting alcohol use. Practicing safe sex. Taking low doses of aspirin every day. Taking vitamin and mineral supplements as recommended by your health care provider. What happens during an annual well check? The services and screenings done by your health care provider during  your annual well check will depend on your age, overall health, lifestyle risk factors, and family history of disease. Counseling  Your health care provider may ask you questions about your: Alcohol use. Tobacco use. Drug use. Emotional well-being. Home and relationship well-being. Sexual activity. Eating habits. History of falls. Memory and ability to understand (cognition). Work and work Statistician. Screening  You may have the following tests or measurements: Height, weight, and BMI. Blood pressure. Lipid and cholesterol levels. These may be checked every 5 years, or more frequently if you are over 27 years old. Skin check. Lung cancer screening. You may have this screening every year starting at age 80 if you have a 30-pack-year history of smoking and currently smoke or have quit within the past 15 years. Fecal occult blood test (FOBT) of the stool. You may have this test every year starting at age 80. Flexible sigmoidoscopy or colonoscopy. You may have a sigmoidoscopy every 5 years or a colonoscopy every 10 years starting at age 80. Prostate cancer screening. Recommendations will vary depending on your family history and other risks. Hepatitis C blood test. Hepatitis B blood test. Sexually transmitted disease (STD) testing. Diabetes screening. This is done by checking your blood sugar (glucose) after you have not eaten for a while (fasting). You may have this done every 1-3 years. Abdominal aortic aneurysm (AAA) screening. You may need this if you are a current or former smoker. Osteoporosis. You may be screened starting at age 80 if if you  are at high risk. Talk with your health care provider about your test results, treatment options, and if necessary, the need for more tests. Vaccines  Your health care provider may recommend certain vaccines, such as: Influenza vaccine. This is recommended every year. Tetanus, diphtheria, and acellular pertussis (Tdap, Td) vaccine. You may need  a Td booster every 10 years. Zoster vaccine. You may need this after age 55. Pneumococcal 13-valent conjugate (PCV13) vaccine. One dose is recommended after age 107. Pneumococcal polysaccharide (PPSV23) vaccine. One dose is recommended after age 54. Talk to your health care provider about which screenings and vaccines you need and how often you need them. This information is not intended to replace advice given to you by your health care provider. Make sure you discuss any questions you have with your health care provider. Document Released: 09/14/2015 Document Revised: 05/07/2016 Document Reviewed: 06/19/2015 Elsevier Interactive Patient Education  2017 [place] Prevention in the Home Falls can cause injuries. They can happen to people of all ages. There are many things you can do to make your home safe and to help prevent falls. What can I do on the outside of my home? Regularly fix the edges of walkways and driveways and fix any cracks. Remove anything that might make you trip as you walk through a door, such as a raised step or threshold. Trim any bushes or trees on the path to your home. Use bright outdoor lighting. Clear any walking paths of anything that might make someone trip, such as rocks or tools. Regularly check to see if handrails are loose or broken. Make sure that both sides of any steps have handrails. Any raised decks and porches should have guardrails on the edges. Have any leaves, snow, or ice cleared regularly. Use sand or salt on walking paths during winter. Clean up any spills in your garage right away. This includes oil or grease spills. What can I do in the bathroom? Use night lights. Install grab bars by the toilet and in the tub and shower. Do not use towel bars as grab bars. Use non-skid mats or decals in the tub or shower. If you need to sit down in the shower, use a plastic, non-slip stool. Keep the floor dry. Clean up any water that spills on the  floor as soon as it happens. Remove soap buildup in the tub or shower regularly. Attach bath mats securely with double-sided non-slip rug tape. Do not have throw rugs and other things on the floor that can make you trip. What can I do in the bedroom? Use night lights. Make sure that you have a light by your bed that is easy to reach. Do not use any sheets or blankets that are too big for your bed. They should not hang down onto the floor. Have a firm chair that has side arms. You can use this for support while you get dressed. Do not have throw rugs and other things on the floor that can make you trip. What can I do in the kitchen? Clean up any spills right away. Avoid walking on wet floors. Keep items that you use a lot in easy-to-reach places. If you need to reach something above you, use a strong step stool that has a grab bar. Keep electrical cords out of the way. Do not use floor polish or wax that makes floors slippery. If you must use wax, use non-skid floor wax. Do not have throw rugs and other things on the  floor that can make you trip. What can I do with my stairs? Do not leave any items on the stairs. Make sure that there are handrails on both sides of the stairs and use them. Fix handrails that are broken or loose. Make sure that handrails are as long as the stairways. Check any carpeting to make sure that it is firmly attached to the stairs. Fix any carpet that is loose or worn. Avoid having throw rugs at the top or bottom of the stairs. If you do have throw rugs, attach them to the floor with carpet tape. Make sure that you have a light switch at the top of the stairs and the bottom of the stairs. If you do not have them, ask someone to add them for you. What else can I do to help prevent falls? Wear shoes that: Do not have high heels. Have rubber bottoms. Are comfortable and fit you well. Are closed at the toe. Do not wear sandals. If you use a stepladder: Make sure that  it is fully opened. Do not climb a closed stepladder. Make sure that both sides of the stepladder are locked into place. Ask someone to hold it for you, if possible. Clearly mark and make sure that you can see: Any grab bars or handrails. First and last steps. Where the edge of each step is. Use tools that help you move around (mobility aids) if they are needed. These include: Canes. Walkers. Scooters. Crutches. Turn on the lights when you go into a dark area. Replace any light bulbs as soon as they burn out. Set up your furniture so you have a clear path. Avoid moving your furniture around. If any of your floors are uneven, fix them. If there are any pets around you, be aware of where they are. Review your medicines with your doctor. Some medicines can make you feel dizzy. This can increase your chance of falling. Ask your doctor what other things that you can do to help prevent falls. This information is not intended to replace advice given to you by your health care provider. Make sure you discuss any questions you have with your health care provider. Document Released: 06/14/2009 Document Revised: 01/24/2016 Document Reviewed: 09/22/2014 Elsevier Interactive Patient Education  2017 [employer].

## 2021-02-14 NOTE — Progress Notes (Signed)
Subjective:   Edwin [name] is a 80 y.o. male who presents for Medicare Annual/Subsequent preventive examination.  Review of Systems    No ROS.  Medicare Wellness Virtual Visit.  Visual/audio telehealth visit, UTA vital signs.   See social history for additional risk factors.   Cardiac Risk Factors include: advanced age (>49men, >109 women);male gender     Objective:    Today's Vitals   02/14/21 1026  Weight: 171 lb (77.6 kg)  Height: 5\' 8"  (1.727 m)   Body mass index is 26 kg/m.  Advanced Directives 02/14/2021 12/31/2020 02/14/2020 08/12/2019 07/19/2019 07/16/2019 10/14/2017  Does Patient Have a Medical Advance Directive? Yes Yes No Yes Yes Yes Yes  Type of Paramedic of [place];Living will Living will - Healthcare Power of [place];Living will  Does patient want to make changes to medical advance directive? No - Patient declined - - - No - Patient declined No - Patient declined No - Patient declined  Copy of [place] in Chart? No - copy requested - - - No - copy requested Yes - validated most recent copy scanned in chart (See row information) No - copy requested  Would patient like information on creating a medical advance directive? - - No - Patient declined - - - -    Current Medications (verified) Outpatient Encounter Medications as of 02/14/2021  Medication Sig   aspirin 81 MG tablet Take 81 mg by mouth at bedtime.   azelastine (ASTELIN) 0.1 % nasal spray Place 1 spray into both nostrils 2 (two) times daily.   Cholecalciferol (VITAMIN D3) 2000 UNITS TABS Take 2,000 Units by mouth daily.    clopidogrel (PLAVIX) 75 MG tablet Take 75 mg by mouth at bedtime.    fluticasone (FLONASE) 50 MCG/ACT nasal spray USE 2 SPRAYS IN EACH NOSTRIL EVERY DAY   latanoprost (XALATAN) 0.005 % ophthalmic solution Place 1 drop into both eyes at bedtime.    levETIRAcetam (KEPPRA XR) 500 MG 24 hr tablet Take 1,000 mg by mouth daily.   levocetirizine (XYZAL) 5 MG tablet Take 1 tablet (5 mg total) by mouth every evening.   magnesium oxide (MAG-OX) 400 MG tablet Take 400 mg by mouth at bedtime.   Multiple Vitamin (MULTIVITAMIN) tablet Take 1 tablet by mouth daily.   mupirocin ointment (BACTROBAN) 2 % Apply 1 application topically 2 (two) times daily. Apply in the nose two times per day.   omeprazole (PRILOSEC) 20 MG capsule TAKE 1 CAPSULE TWICE DAILY BEFORE MEALS   polyethylene glycol (MIRALAX / GLYCOLAX) 17 g packet Take 17 g by mouth daily.   Saw Palmetto 450 MG CAPS Take 450 mg by mouth daily.    simvastatin (ZOCOR) 40 MG tablet Take 40 mg by mouth every evening.    triamcinolone (KENALOG) 0.1 % Apply 1 application topically 2 (two) times daily.   No facility-administered encounter medications on file as of 02/14/2021.    Allergies (verified) Penicillins and Sulfa antibiotics   History: Past Medical History:  Diagnosis Date   Allergy    Arthritis    Coronary artery disease    Diverticulosis of colon    GERD (gastroesophageal reflux disease)    ESOPHAGEAL REFLUX   Glaucoma    Gout    Hemorrhoids    History of kidney stones    Hx of colonic polyp    Hypercholesterolemia    Hypertension    Schatzki's ring  Seizures ([place])    Past Surgical History:  Procedure Laterality Date   CARDIAC CATHETERIZATION     COLONOSCOPY WITH PROPOFOL N/A 05/18/2017   Procedure: COLONOSCOPY WITH PROPOFOL;  Surgeon: Manya Silvas, MD;  Location: [facility] ENDOSCOPY;  Service: Endoscopy;  Laterality: N/A;   CORONARY ANGIOPLASTY     EYE SURGERY  07/20/13   cataract   HERNIA REPAIR Right 2008 & 2012   VASECTOMY  1984   Family History  Problem Relation Age of Onset   Arthritis Mother    Heart disease Mother    Stroke Mother    Hypertension Mother    Arthritis Father    Hyperlipidemia Father    Heart disease Father    Hypertension Father     Parkinson's disease Father    Multiple sclerosis Sister    Colon cancer Maternal Grandmother    Arthritis Paternal Grandmother    Colon cancer Paternal Grandmother    Social History   Socioeconomic History   Marital status: Married    Spouse name: Not on file   Number of children: Not on file   Years of education: Not on file   Highest education level: Not on file  Occupational History   Not on file  Tobacco Use   Smoking status: Never   Smokeless tobacco: Never  Vaping Use   Vaping Use: Never used  Substance and Sexual Activity   Alcohol use: No    Alcohol/week: 0.0 standard drinks   Drug use: No   Sexual activity: Never  Other Topics Concern   Not on file  Social History Narrative   Not on file   Social Determinants of Health   Financial Resource Strain: Low Risk    Difficulty of Paying Living Expenses: Not hard at all  Food Insecurity: No Food Insecurity   Worried About Charity fundraiser in the Last Year: Never true   [place] in the Last Year: Never true  Transportation Needs: No Transportation Needs   Lack of Transportation (Medical): No   Lack of Transportation (Non-Medical): No  Physical Activity: Not on file  Stress: No Stress Concern Present   Feeling of Stress : Not at all  Social Connections: Unknown   Frequency of Communication with Friends and Family: Not on file   Frequency of Social Gatherings with Friends and Family: Not on file   Attends Religious Services: Not on file   Active Member of Clubs or Organizations: Not on file   Attends Archivist Meetings: Not on file   Marital Status: Married    Tobacco Counseling Counseling given: Not Answered   Clinical Intake:  Pre-visit preparation completed: Yes        Diabetes: No  How often do you need to have someone help you when you read instructions, pamphlets, or other written materials from your doctor or pharmacy?: 1 - Never   Interpreter Needed?: No       Activities of Daily Living In your present state of health, do you have any difficulty performing the following activities: 02/14/2021  Vision? N  Difficulty concentrating or making decisions? N  Walking or climbing stairs? N  Dressing or bathing? N  Doing errands, shopping? N  Preparing Food and eating ? N  Using the Toilet? N  In the past six months, have you accidently leaked urine? N  Do you have problems with loss of bowel control? N  Managing your Medications? N  Managing your Finances? N  Housekeeping  or managing your Housekeeping? N  Some recent data might be hidden    Patient Care Team: Einar Pheasant, MD as PCP - General (Internal Medicine)  Indicate any recent Medical Services you may have received from other than [facility] providers in the past year (date may be approximate).     Assessment:   This is a routine wellness examination for [place].  I connected with Avinash today by telephone and verified that I am speaking with the correct person using two identifiers. Location patient: home Location provider: work Persons participating in the virtual visit: patient, Marine scientist.    I discussed the limitations, risks, security and privacy concerns of performing an evaluation and management service by telephone and the availability of in person appointments. The patient expressed understanding and verbally consented to this telephonic visit.    Interactive audio and video telecommunications were attempted between this provider and patient, however failed, due to patient having technical difficulties OR patient did not have access to video capability.  We continued and completed visit with audio only.  Some vital signs may be absent or patient reported.   Hearing/Vision screen Hearing Screening - Comments:: Patient has difficulty hearing conversational tones L ear. He does not wear hearing aids. He does have a headset amplifier. Followed by [facility]. Vision Screening -  Comments:: Followed by [facility]  Wears corrective lenses  Visits every 6 months Glaucoma; drops in use  Cataract extraction, bilateral  Visual acuity not assessed per patient preference since they have regular follow up with the ophthalmologist  Dietary issues and exercise activities discussed: Current Exercise Habits: Home exercise routine, Intensity: Mild Healthy diet Good water intake   Goals Addressed             This Visit's Progress    COMPLETED: Exercise 5x per week (67min per time)       Patient centered goal is to walk up to 90 min daily, 5 days per week to reach target weight of 165 lbs, weather permitting.       Maintain healthy lifestyle       Stay active Stay hydrated Healthy diet        Depression Screen PHQ 2/9 Scores 02/14/2021 09/12/2020 02/14/2020 08/10/2019 08/01/2019 10/14/2017 09/21/2017  PHQ - 2 Score 0 0 0 3 0 0 0  PHQ- 9 Score - - - 12 - - -    Fall Risk Fall Risk  02/14/2021 01/10/2021 02/14/2020 08/09/2019 08/09/2019  Falls in the past year? 0 0 0 0 0  Number falls in past yr: - - 0 0 0  Injury with Fall? - - - 0 0  Follow up - - Falls evaluation completed - -    FALL RISK PREVENTION PERTAINING TO THE HOME: Handrails in use when climbing stairs?Yes Home free of loose throw rugs in walkways, pet beds, electrical cords, etc? Yes  Adequate lighting in your home to reduce risk of falls? Yes   ASSISTIVE DEVICES UTILIZED TO PREVENT FALLS:  Life alert? No  Use of a cane, walker or w/c? No   TIMED UP AND GO: Was the test performed? No .   Cognitive Function: Patient is alert and oriented x3.  Denies difficulty focusing, making decisions, memory loss.  Enjoys reading online.  MMSE/6CIT deferred. Normal by direct communication/observation.  MMSE - Mini Mental State Exam 10/14/2017 10/12/2015  Orientation to time 5 5  Orientation to Place 5 5  Registration 3 3  Attention/ Calculation 5 5  Recall 2  3  Language- name 2 objects 2 2   Language- repeat 1 1  Language- follow 3 step command 3 3  Language- read & follow direction 1 1  Write a sentence 1 1  Copy design 1 1  Total score 29 30     6CIT Screen 02/14/2020 10/13/2016  What Year? - 0 points  What month? - 0 points  What time? - 0 points  Count back from 20 - 0 points  Months in reverse 0 points 0 points  Repeat phrase - 0 points  Total Score - 0    Immunizations Immunization History  Administered Date(s) Administered   Fluad Quad(high Dose 65+) 06/29/2019, 06/04/2020   Influenza, High Dose Seasonal PF 06/12/2016, 06/17/2017, 06/23/2018   Influenza,inj,Quad PF,6+ Mos 07/29/2013, 06/20/2014, 07/18/2015   PFIZER(Purple Top)SARS-COV-2 Vaccination 09/19/2019, 10/10/2019   Pneumococcal Conjugate-13 08/09/2013   Pneumococcal Polysaccharide-23 07/18/2015   Tdap 09/01/2004, 07/05/2014   Zoster Recombinat (Shingrix) 06/14/2019, 12/09/2019   Zoster, Live 08/29/2013   Health Maintenance Health Maintenance  Topic Date Due   COVID-19 Vaccine (3 - Booster for [place] series) 03/02/2021 (Originally 03/08/2020)   INFLUENZA VACCINE  04/01/2021   TETANUS/TDAP  07/05/2024   Hepatitis C Screening  Completed   PNA vac Low Risk Adult  Completed   Zoster Vaccines- Shingrix  Completed   HPV VACCINES  Aged Out   Colonoscopy- no longer required.  Lung Cancer Screening: (Low Dose CT Chest recommended if Age 6-80 years, 30 pack-year currently smoking OR have quit w/in 15years.) does not qualify.   Vision Screening: Recommended annual ophthalmology exams for early detection of glaucoma and other disorders of the eye. Is the patient up to date with their annual eye exam?  Yes . Visits every 6 months.   Dental Screening: Recommended annual dental exams for proper oral hygiene  Community Resource Referral / Chronic Care Management: CRR required this visit?  No   CCM required this visit?  No      Plan:   Keep all routine maintenance appointments.   I have personally  reviewed and noted the following in the patient's chart:   Medical and social history Use of alcohol, tobacco or illicit drugs  Current medications and supplements including opioid prescriptions. Patient is not currently taking opioid prescriptions. Functional ability and status Nutritional status Physical activity Advanced directives List of other physicians Hospitalizations, surgeries, and ER visits in previous 12 months Vitals Screenings to include cognitive, depression, and falls Referrals and appointments  In addition, I have reviewed and discussed with patient certain preventive protocols, quality metrics, and best practice recommendations. A written personalized care plan for preventive services as well as general preventive health recommendations were provided to patient.     Varney Biles, LPN   9/37/9024

## 2021-02-15 NOTE — Telephone Encounter (Signed)
Opened in error

## 2021-04-08 DIAGNOSIS — L570 Actinic keratosis: Secondary | ICD-10-CM | POA: Diagnosis not present

## 2021-04-08 DIAGNOSIS — L821 Other seborrheic keratosis: Secondary | ICD-10-CM | POA: Diagnosis not present

## 2021-05-10 DIAGNOSIS — H9042 Sensorineural hearing loss, unilateral, left ear, with unrestricted hearing on the contralateral side: Secondary | ICD-10-CM | POA: Diagnosis not present

## 2021-05-10 DIAGNOSIS — H6063 Unspecified chronic otitis externa, bilateral: Secondary | ICD-10-CM | POA: Diagnosis not present

## 2021-05-10 DIAGNOSIS — H6123 Impacted cerumen, bilateral: Secondary | ICD-10-CM | POA: Diagnosis not present

## 2021-05-13 ENCOUNTER — Other Ambulatory Visit (INDEPENDENT_AMBULATORY_CARE_PROVIDER_SITE_OTHER): Payer: Medicare HMO

## 2021-05-13 ENCOUNTER — Other Ambulatory Visit: Payer: Self-pay

## 2021-05-13 DIAGNOSIS — E7800 Pure hypercholesterolemia, unspecified: Secondary | ICD-10-CM

## 2021-05-13 DIAGNOSIS — R739 Hyperglycemia, unspecified: Secondary | ICD-10-CM | POA: Diagnosis not present

## 2021-05-13 LAB — HEMOGLOBIN A1C: Hgb A1c MFr Bld: 5.5 % (ref 4.6–6.5)

## 2021-05-13 LAB — BASIC METABOLIC PANEL
BUN: 14 mg/dL (ref 6–23)
CO2: 24 mEq/L (ref 19–32)
Calcium: 9.4 mg/dL (ref 8.4–10.5)
Chloride: 105 mEq/L (ref 96–112)
Creatinine, Ser: 1 mg/dL (ref 0.40–1.50)
GFR: 71.38 mL/min (ref >60.00)
Glucose, Bld: 107 mg/dL — ABNORMAL HIGH (ref 70–99)
Potassium: 3.8 mEq/L (ref 3.5–5.1)
Sodium: 138 mEq/L (ref 135–145)

## 2021-05-13 LAB — HEPATIC FUNCTION PANEL
ALT: 18 U/L (ref 0–53)
AST: 20 U/L (ref 0–37)
Albumin: 4 g/dL (ref 3.5–5.2)
Alkaline Phosphatase: 48 U/L (ref 39–117)
Bilirubin, Direct: 0.1 mg/dL (ref 0.0–0.3)
Total Bilirubin: 1.1 mg/dL (ref 0.2–1.2)
Total Protein: 6.8 g/dL (ref 6.0–8.3)

## 2021-05-13 LAB — LIPID PANEL
Cholesterol: 132 mg/dL (ref 0–200)
HDL: 32.2 mg/dL — ABNORMAL LOW (ref >39.00)
LDL Cholesterol: 72 mg/dL (ref 0–99)
NonHDL: 99.77
Total CHOL/HDL Ratio: 4
Triglycerides: 141 mg/dL (ref 0.0–149.0)
VLDL: 28.2 mg/dL (ref 0.0–40.0)

## 2021-05-15 ENCOUNTER — Ambulatory Visit (INDEPENDENT_AMBULATORY_CARE_PROVIDER_SITE_OTHER): Payer: Medicare HMO | Admitting: Internal Medicine

## 2021-05-15 ENCOUNTER — Other Ambulatory Visit: Payer: Self-pay

## 2021-05-15 VITALS — BP 122/72 | HR 77 | Temp 97.8°F | Resp 16 | Ht 68.0 in | Wt 171.2 lb

## 2021-05-15 DIAGNOSIS — Z23 Encounter for immunization: Secondary | ICD-10-CM | POA: Diagnosis not present

## 2021-05-15 DIAGNOSIS — K219 Gastro-esophageal reflux disease without esophagitis: Secondary | ICD-10-CM | POA: Diagnosis not present

## 2021-05-15 DIAGNOSIS — Z125 Encounter for screening for malignant neoplasm of prostate: Secondary | ICD-10-CM

## 2021-05-15 DIAGNOSIS — R739 Hyperglycemia, unspecified: Secondary | ICD-10-CM | POA: Diagnosis not present

## 2021-05-15 DIAGNOSIS — E7800 Pure hypercholesterolemia, unspecified: Secondary | ICD-10-CM

## 2021-05-15 DIAGNOSIS — I2510 Atherosclerotic heart disease of native coronary artery without angina pectoris: Secondary | ICD-10-CM

## 2021-05-15 DIAGNOSIS — S2239XD Fracture of one rib, unspecified side, subsequent encounter for fracture with routine healing: Secondary | ICD-10-CM | POA: Diagnosis not present

## 2021-05-15 DIAGNOSIS — G40909 Epilepsy, unspecified, not intractable, without status epilepticus: Secondary | ICD-10-CM | POA: Diagnosis not present

## 2021-05-15 DIAGNOSIS — I700 Atherosclerosis of aorta: Secondary | ICD-10-CM

## 2021-05-15 DIAGNOSIS — F419 Anxiety disorder, unspecified: Secondary | ICD-10-CM | POA: Diagnosis not present

## 2021-05-15 NOTE — Progress Notes (Signed)
Patient ID: Edwin [name], male   DOB: March 13, 1941, 80 y.o.   MRN: [id number]   Subjective:    Patient ID: Edwin [name], male    DOB: 1941-08-09, 80 y.o.   MRN: [id number]  This visit occurred during the SARS-CoV-2 public health emergency.  Safety protocols were in place, including screening questions prior to the visit, additional usage of staff PPE, and extensive cleaning of exam room while observing appropriate contact time as indicated for disinfecting solutions.   Patient here for a scheduled follow up.   Chief Complaint  Patient presents with   Gastroesophageal Reflux   Hyperlipidemia   Hyperglycemia   .   HPI He reports he is doing relatively well.  Trying to stay active.  No further falls.  Recent rib fracture per previous note.  No residual pain.  Discussed bone density.  No chest pain or sob reported.  No abdominal pain or bowel change reported.  Seeing neurology - f/u seizures.  On keppra.     Past Medical History:  Diagnosis Date   Allergy    Arthritis    Coronary artery disease    Diverticulosis of colon    GERD (gastroesophageal reflux disease)    ESOPHAGEAL REFLUX   Glaucoma    Gout    Hemorrhoids    History of kidney stones    Hx of colonic polyp    Hypercholesterolemia    Hypertension    Schatzki's ring    Seizures ([place])    Past Surgical History:  Procedure Laterality Date   CARDIAC CATHETERIZATION     COLONOSCOPY WITH PROPOFOL N/A 05/18/2017   Procedure: COLONOSCOPY WITH PROPOFOL;  Surgeon: Manya Silvas, MD;  Location: [facility] ENDOSCOPY;  Service: Endoscopy;  Laterality: N/A;   CORONARY ANGIOPLASTY     EYE SURGERY  07/20/13   cataract   HERNIA REPAIR Right 2008 & 2012   VASECTOMY  1984   Family History  Problem Relation Age of Onset   Arthritis Mother    Heart disease Mother    Stroke Mother    Hypertension Mother    Arthritis Father    Hyperlipidemia Father    Heart disease Father    Hypertension Father    Parkinson's disease  Father    Multiple sclerosis Sister    Colon cancer Maternal Grandmother    Arthritis Paternal Grandmother    Colon cancer Paternal Grandmother    Social History   Socioeconomic History   Marital status: Married    Spouse name: Not on file   Number of children: Not on file   Years of education: Not on file   Highest education level: Not on file  Occupational History   Not on file  Tobacco Use   Smoking status: Never   Smokeless tobacco: Never  Vaping Use   Vaping Use: Never used  Substance and Sexual Activity   Alcohol use: No    Alcohol/week: 0.0 standard drinks   Drug use: No   Sexual activity: Never  Other Topics Concern   Not on file  Social History Narrative   Not on file   Social Determinants of Health   Financial Resource Strain: Low Risk    Difficulty of Paying Living Expenses: Not hard at all  Food Insecurity: No Food Insecurity   Worried About Charity fundraiser in the Last Year: Never true   [place] in the Last Year: Never true  Transportation Needs: No Transportation Needs   Lack of  Transportation (Medical): No   Lack of Transportation (Non-Medical): No  Physical Activity: Not on file  Stress: No Stress Concern Present   Feeling of Stress : Not at all  Social Connections: Unknown   Frequency of Communication with Friends and Family: Not on file   Frequency of Social Gatherings with Friends and Family: Not on file   Attends Religious Services: Not on file   Active Member of Clubs or Organizations: Not on file   Attends Archivist Meetings: Not on file   Marital Status: Married    Review of Systems  Constitutional:  Negative for appetite change and unexpected weight change.  HENT:  Negative for congestion and sinus pressure.   Respiratory:  Negative for cough, chest tightness and shortness of breath.   Cardiovascular:  Negative for chest pain, palpitations and leg swelling.  Gastrointestinal:  Negative for abdominal pain,  diarrhea, nausea and vomiting.  Genitourinary:  Negative for difficulty urinating and dysuria.  Musculoskeletal:  Negative for joint swelling and myalgias.  Skin:  Negative for color change and rash.  Neurological:  Negative for dizziness, light-headedness and headaches.  Psychiatric/Behavioral:  Negative for agitation and dysphoric mood.       Objective:     BP 122/72   Pulse 77   Temp 97.8 F (36.6 C)   Resp 16   Ht _0  (1.727 m)   Wt 171 lb 3.2 oz (77.7 kg)   SpO2 98%   BMI 26.03 kg/m  Wt Readings from Last 3 Encounters:  05/15/21 171 lb 3.2 oz (77.7 kg)  02/14/21 171 lb (77.6 kg)  01/10/21 171 lb (77.6 kg)    Physical Exam Constitutional:      General: He is not in acute distress.    Appearance: Normal appearance. He is well-developed.  HENT:     Head: Normocephalic and atraumatic.     Right Ear: External ear normal.     Left Ear: External ear normal.  Eyes:     General: No scleral icterus.       Right eye: No discharge.        Left eye: No discharge.  Cardiovascular:     Rate and Rhythm: Normal rate and regular rhythm.  Pulmonary:     Effort: Pulmonary effort is normal. No respiratory distress.     Breath sounds: Normal breath sounds.  Abdominal:     General: Bowel sounds are normal.     Palpations: Abdomen is soft.     Tenderness: There is no abdominal tenderness.  Musculoskeletal:        General: No swelling or tenderness.     Cervical back: Neck supple. No tenderness.  Lymphadenopathy:     Cervical: No cervical adenopathy.  Skin:    Findings: No erythema or rash.  Neurological:     Mental Status: He is alert.  Psychiatric:        Mood and Affect: Mood normal.        Behavior: Behavior normal.     Outpatient Encounter Medications as of 05/15/2021  Medication Sig   aspirin 81 MG tablet Take 81 mg by mouth at bedtime.   azelastine (ASTELIN) 0.1 % nasal spray Place 1 spray into both nostrils 2 (two) times daily.   Cholecalciferol (VITAMIN D3)  2000 UNITS TABS Take 2,000 Units by mouth daily.    clopidogrel (PLAVIX) 75 MG tablet Take 75 mg by mouth at bedtime.    fluticasone (FLONASE) 50 MCG/ACT nasal spray USE 2 SPRAYS IN  EACH NOSTRIL EVERY DAY   latanoprost (XALATAN) 0.005 % ophthalmic solution Place 1 drop into both eyes at bedtime.   levETIRAcetam (KEPPRA XR) 500 MG 24 hr tablet Take 1,000 mg by mouth daily.   levocetirizine (XYZAL) 5 MG tablet Take 1 tablet (5 mg total) by mouth every evening.   magnesium oxide (MAG-OX) 400 MG tablet Take 400 mg by mouth at bedtime.   Multiple Vitamin (MULTIVITAMIN) tablet Take 1 tablet by mouth daily.   mupirocin ointment (BACTROBAN) 2 % Apply 1 application topically 2 (two) times daily. Apply in the nose two times per day.   omeprazole (PRILOSEC) 20 MG capsule TAKE 1 CAPSULE TWICE DAILY BEFORE MEALS   polyethylene glycol (MIRALAX / GLYCOLAX) 17 g packet Take 17 g by mouth daily.   Saw Palmetto 450 MG CAPS Take 450 mg by mouth daily.    simvastatin (ZOCOR) 40 MG tablet Take 40 mg by mouth every evening.    triamcinolone (KENALOG) 0.1 % Apply 1 application topically 2 (two) times daily.   No facility-administered encounter medications on file as of 05/15/2021.     Lab Results  Component Value Date   WBC 7.6 12/31/2020   HGB 14.4 12/31/2020   HCT 42.2 12/31/2020   PLT 211 12/31/2020   GLUCOSE 107 (H) 05/13/2021   CHOL 132 05/13/2021   TRIG 141.0 05/13/2021   HDL 32.20 (L) 05/13/2021   LDLDIRECT 99.1 07/29/2013   LDLCALC 72 05/13/2021   ALT 18 05/13/2021   AST 20 05/13/2021   NA 138 05/13/2021   K 3.8 05/13/2021   CL 105 05/13/2021   CREATININE 1.00 05/13/2021   BUN 14 05/13/2021   CO2 24 05/13/2021   TSH 1.49 09/10/2020   PSA 0.93 09/10/2020   HGBA1C 5.5 05/13/2021    DG Ribs Unilateral W/Chest Right  Result Date: 12/31/2020 CLINICAL DATA:  Struck chest wall on handle of treadmill on Wednesday EXAM: RIGHT RIBS AND CHEST - 3+ VIEW COMPARISON:  None. FINDINGS: Some abrupt  cortical angulation noted along the posterolateral sixth through eighth right ribs which could reflect some minimally displaced rib fractures in the setting of acute chest wall trauma. No associated pneumothorax or visible effusion. No focal consolidative process or convincing edema. The cardiomediastinal contours are unremarkable. No other acute osseous or soft tissue abnormalities. IMPRESSION: Abrupt angulation of the posterolateral sixth through eighth ribs, could reflect minimally displaced fractures. No associated pneumothorax or effusion. Electronically Signed   By: Lovena Le M.D.   On: 12/31/2020 06:26   CT CHEST ABDOMEN PELVIS WO CONTRAST  Result Date: 12/31/2020 CLINICAL DATA:  80 year old male status post blunt trauma last week. Increased pain with movement. EXAM: CT CHEST, ABDOMEN AND PELVIS WITHOUT CONTRAST TECHNIQUE: Multidetector CT imaging of the chest, abdomen and pelvis was performed following the standard protocol without IV contrast. COMPARISON:  Chest and right rib series earlier today. CT Abdomen and Pelvis 07/18/2019. FINDINGS: CT CHEST FINDINGS Cardiovascular: Normal caliber of the thoracic aorta with mild calcified atherosclerosis. No evidence of periaortic hematoma on this noncontrast exam. Cardiac size within normal limits. Calcified coronary artery atherosclerosis. No pericardial effusion. Vascular patency is not evaluated in the absence of IV contrast. Mediastinum/Nodes: Small chronic hiatal hernia. No mediastinal hematoma or lymphadenopathy in the absence of IV contrast. Lungs/Pleura: Major airways are patent. Improved lung base ventilation compared to 2020. Mild residual scarring or atelectasis on the right. No pneumothorax, pleural effusion, pulmonary contusion. Musculoskeletal: Osteopenia. Minimally displaced fracture right posterolateral 9th rib (series 4, image 122.  Otherwise there is only subtle angulation of the right lateral 5th through 8th ribs, indeterminate for  nondisplaced fractures. Intact sternum. Visible shoulder osseous structures appear intact. Mild lower thoracic endplate deformities appear stable since 2020. No convincing acute thoracic vertebral fracture. CT ABDOMEN PELVIS FINDINGS Hepatobiliary: Absent gallbladder now. Negative noncontrast liver. No perihepatic fluid. Pancreas: Negative. Spleen: Negative noncontrast spleen.  No perisplenic fluid. Adrenals/Urinary Tract: Normal adrenal glands. Noncontrast kidneys appear stable since 2020 and nonobstructed. Small chronic right renal upper pole cyst. Decompressed and negative ureters. Unremarkable urinary bladder. Stomach/Bowel: Extensive diverticulosis from the descending through the sigmoid colon. No active inflammation. Otherwise redundant large bowel with intermittent retained stool. Mild diverticula in the right colon. Normal appendix on coronal image 78. Decompressed and negative terminal ileum. No dilated small bowel. Decompressed stomach and duodenum. No free air, free fluid, mesenteric inflammation. Vascular/Lymphatic: Aortoiliac calcified atherosclerosis. Abdominal aortic caliber remains within normal limits. Vascular patency is not evaluated in the absence of IV contrast. No lymphadenopathy. Reproductive: Negative. Other: No pelvic free fluid. Musculoskeletal: Lumbar vertebrae, sacrum, SI joints, pelvis and proximal femurs appear stable and intact. IMPRESSION: 1. Minimally displaced fracture of the right posterolateral 9th rib. Underlying osteopenia. Difficult to exclude nondisplaced rib fractures at several adjacent levels. 2. No other acute traumatic injury identified in the noncontrast chest, abdomen, or pelvis. 3. Cholecystectomy since 2020. Aortic Atherosclerosis (ICD10-I70.0). Small chronic hiatal hernia. Distal large bowel diverticulosis. Electronically Signed   By: Genevie Ann M.D.   On: 12/31/2020 08:08       Assessment & Plan:   Problem List Items Addressed This Visit     Anxiety    Doing  well.  Off lorazepam.        Aortic atherosclerosis (HCC)    Continue simvastatin.  Low cholesterol diet and exercise.  Follow lipid panel and liver function tests.       CAD (coronary artery disease)    Cath as outlined.  Continue risk factor modification.  Currently asymptomatic.        GERD (gastroesophageal reflux disease)    Continue omeprazole.  No upper symptoms reported.       Hypercholesterolemia    Continue simvastatin.  Low cholesterol diet and exercise.  Follow lipid panel and liver function tests.       Relevant Orders   TSH   Lipid panel   Basic metabolic panel   Hepatic function panel   Hyperglycemia    Low carb diet and exercise.  Follow met b and a1c.       Relevant Orders   Hemoglobin A1c   Rib fracture    Recent injury.  No pain.  Agreeable to bone density.        Relevant Orders   DG Bone Density   Seizure disorder ([place])    Doing well on keppra.  Followed by neurology.       Other Visit Diagnoses     Prostate cancer screening    -  Primary   Relevant Orders   PSA, Medicare   Need for immunization against influenza       Relevant Orders   Flu Vaccine QUAD High Dose(Fluad) (Completed)        Einar Pheasant, MD

## 2021-05-20 DIAGNOSIS — I2510 Atherosclerotic heart disease of native coronary artery without angina pectoris: Secondary | ICD-10-CM | POA: Diagnosis not present

## 2021-05-20 DIAGNOSIS — G4733 Obstructive sleep apnea (adult) (pediatric): Secondary | ICD-10-CM | POA: Diagnosis not present

## 2021-05-20 DIAGNOSIS — I10 Essential (primary) hypertension: Secondary | ICD-10-CM | POA: Diagnosis not present

## 2021-05-20 DIAGNOSIS — E7800 Pure hypercholesterolemia, unspecified: Secondary | ICD-10-CM | POA: Diagnosis not present

## 2021-05-20 DIAGNOSIS — Z9861 Coronary angioplasty status: Secondary | ICD-10-CM | POA: Diagnosis not present

## 2021-05-21 ENCOUNTER — Encounter: Payer: Self-pay | Admitting: Internal Medicine

## 2021-05-21 NOTE — Assessment & Plan Note (Signed)
Recent injury.  No pain.  Agreeable to bone density.

## 2021-05-21 NOTE — Assessment & Plan Note (Signed)
Low carb diet and exercise.  Follow met b and a1c.

## 2021-05-21 NOTE — Assessment & Plan Note (Signed)
Continue simvastatin.  Low cholesterol diet and exercise.  Follow lipid panel and liver function tests.   

## 2021-05-21 NOTE — Assessment & Plan Note (Signed)
Doing well.  Off lorazepam.

## 2021-05-21 NOTE — Assessment & Plan Note (Signed)
Cath as outlined.  Continue risk factor modification.  Currently asymptomatic.

## 2021-05-21 NOTE — Assessment & Plan Note (Signed)
Doing well on keppra.  Followed by neurology.  

## 2021-05-21 NOTE — Assessment & Plan Note (Signed)
Continue omeprazole.  No upper symptoms reported.

## 2021-06-10 DIAGNOSIS — G4733 Obstructive sleep apnea (adult) (pediatric): Secondary | ICD-10-CM | POA: Diagnosis not present

## 2021-06-25 DIAGNOSIS — H40003 Preglaucoma, unspecified, bilateral: Secondary | ICD-10-CM | POA: Diagnosis not present

## 2021-06-26 ENCOUNTER — Other Ambulatory Visit: Payer: Self-pay | Admitting: Internal Medicine

## 2021-07-10 ENCOUNTER — Other Ambulatory Visit: Payer: Self-pay | Admitting: Internal Medicine

## 2021-07-30 DIAGNOSIS — L570 Actinic keratosis: Secondary | ICD-10-CM | POA: Diagnosis not present

## 2021-07-30 DIAGNOSIS — D225 Melanocytic nevi of trunk: Secondary | ICD-10-CM | POA: Diagnosis not present

## 2021-07-30 DIAGNOSIS — Z859 Personal history of malignant neoplasm, unspecified: Secondary | ICD-10-CM | POA: Diagnosis not present

## 2021-07-30 DIAGNOSIS — L821 Other seborrheic keratosis: Secondary | ICD-10-CM | POA: Diagnosis not present

## 2021-07-30 DIAGNOSIS — L578 Other skin changes due to chronic exposure to nonionizing radiation: Secondary | ICD-10-CM | POA: Diagnosis not present

## 2021-07-30 DIAGNOSIS — L218 Other seborrheic dermatitis: Secondary | ICD-10-CM | POA: Diagnosis not present

## 2021-08-09 DIAGNOSIS — G4733 Obstructive sleep apnea (adult) (pediatric): Secondary | ICD-10-CM | POA: Diagnosis not present

## 2021-09-03 DIAGNOSIS — G4733 Obstructive sleep apnea (adult) (pediatric): Secondary | ICD-10-CM | POA: Diagnosis not present

## 2021-09-13 ENCOUNTER — Other Ambulatory Visit: Payer: Self-pay

## 2021-09-13 ENCOUNTER — Other Ambulatory Visit (INDEPENDENT_AMBULATORY_CARE_PROVIDER_SITE_OTHER): Payer: Medicare HMO

## 2021-09-13 DIAGNOSIS — R739 Hyperglycemia, unspecified: Secondary | ICD-10-CM | POA: Diagnosis not present

## 2021-09-13 DIAGNOSIS — Z125 Encounter for screening for malignant neoplasm of prostate: Secondary | ICD-10-CM

## 2021-09-13 DIAGNOSIS — E7800 Pure hypercholesterolemia, unspecified: Secondary | ICD-10-CM | POA: Diagnosis not present

## 2021-09-13 LAB — BASIC METABOLIC PANEL
BUN: 15 mg/dL (ref 6–23)
CO2: 25 mEq/L (ref 19–32)
Calcium: 9.3 mg/dL (ref 8.4–10.5)
Chloride: 105 mEq/L (ref 96–112)
Creatinine, Ser: 1.01 mg/dL (ref 0.40–1.50)
GFR: 70.36 mL/min (ref >60.00)
Glucose, Bld: 104 mg/dL — ABNORMAL HIGH (ref 70–99)
Potassium: 4.1 mEq/L (ref 3.5–5.1)
Sodium: 139 mEq/L (ref 135–145)

## 2021-09-13 LAB — PSA, MEDICARE: PSA: 1.19 ng/ml (ref 0.10–4.00)

## 2021-09-13 LAB — HEPATIC FUNCTION PANEL
ALT: 18 U/L (ref 0–53)
AST: 23 U/L (ref 0–37)
Albumin: 4.2 g/dL (ref 3.5–5.2)
Alkaline Phosphatase: 62 U/L (ref 39–117)
Bilirubin, Direct: 0.2 mg/dL (ref 0.0–0.3)
Total Bilirubin: 1 mg/dL (ref 0.2–1.2)
Total Protein: 6.9 g/dL (ref 6.0–8.3)

## 2021-09-13 LAB — TSH: TSH: 2.72 u[IU]/mL (ref 0.35–5.50)

## 2021-09-13 LAB — LIPID PANEL
Cholesterol: 144 mg/dL (ref 0–200)
HDL: 34.6 mg/dL — ABNORMAL LOW (ref >39.00)
LDL Cholesterol: 79 mg/dL (ref 0–99)
NonHDL: 109.09
Total CHOL/HDL Ratio: 4
Triglycerides: 152 mg/dL — ABNORMAL HIGH (ref 0.0–149.0)
VLDL: 30.4 mg/dL (ref 0.0–40.0)

## 2021-09-13 LAB — HEMOGLOBIN A1C: Hgb A1c MFr Bld: 5.6 % (ref 4.6–6.5)

## 2021-09-16 ENCOUNTER — Other Ambulatory Visit: Payer: Self-pay

## 2021-09-16 ENCOUNTER — Ambulatory Visit (INDEPENDENT_AMBULATORY_CARE_PROVIDER_SITE_OTHER): Payer: Medicare HMO | Admitting: Internal Medicine

## 2021-09-16 ENCOUNTER — Encounter: Payer: Self-pay | Admitting: Internal Medicine

## 2021-09-16 DIAGNOSIS — R739 Hyperglycemia, unspecified: Secondary | ICD-10-CM

## 2021-09-16 DIAGNOSIS — I2510 Atherosclerotic heart disease of native coronary artery without angina pectoris: Secondary | ICD-10-CM

## 2021-09-16 DIAGNOSIS — G4730 Sleep apnea, unspecified: Secondary | ICD-10-CM | POA: Diagnosis not present

## 2021-09-16 DIAGNOSIS — E7800 Pure hypercholesterolemia, unspecified: Secondary | ICD-10-CM

## 2021-09-16 DIAGNOSIS — F419 Anxiety disorder, unspecified: Secondary | ICD-10-CM | POA: Diagnosis not present

## 2021-09-16 DIAGNOSIS — I700 Atherosclerosis of aorta: Secondary | ICD-10-CM | POA: Diagnosis not present

## 2021-09-16 DIAGNOSIS — K219 Gastro-esophageal reflux disease without esophagitis: Secondary | ICD-10-CM

## 2021-09-16 DIAGNOSIS — G40909 Epilepsy, unspecified, not intractable, without status epilepticus: Secondary | ICD-10-CM

## 2021-09-16 NOTE — Progress Notes (Signed)
Patient ID: Edwin [name], male   DOB: 12-03-1940, 81 y.o.   MRN: [id number]   Subjective:    Patient ID: Edwin [name], male    DOB: 10-Nov-1940, 81 y.o.   MRN: [id number]  This visit occurred during the SARS-CoV-2 public health emergency.  Safety protocols were in place, including screening questions prior to the visit, additional usage of staff PPE, and extensive cleaning of exam room while observing appropriate contact time as indicated for disinfecting solutions.   Patient here for a scheduled follow up.   HPI Here to follow up regarding blood pressure and cholesterol.  Recently evaluated by neurology.  Home sleep study showing moderate, positional Obstructive Sleep Apnea. Insufficient non-supine study time to assess severity of non-positional Obstructive Sleep Apnea. Per note, initiated a referral for PAP therapy initiation (AutoPAP 5 to 20 cm H2O).  Pt had questions regarding sleep study and was unaware of above recommendation.  Tries to stay active.  No chest pain or sob reported.  No abdominal pain or bowel change reported.  No seizures.  Saw cardiology 05/20/21 - f/u CAD s/p PTCA/PCI - on aspirin and plavix.  They recommended stopping plavix and continuing only aspirin daily.  Blood pressure ok.     Past Medical History:  Diagnosis Date   Allergy    Arthritis    Coronary artery disease    Diverticulosis of colon    GERD (gastroesophageal reflux disease)    ESOPHAGEAL REFLUX   Glaucoma    Gout    Hemorrhoids    History of kidney stones    Hx of colonic polyp    Hypercholesterolemia    Hypertension    Schatzki's ring    Seizures ([place])    Past Surgical History:  Procedure Laterality Date   CARDIAC CATHETERIZATION     COLONOSCOPY WITH PROPOFOL N/A 05/18/2017   Procedure: COLONOSCOPY WITH PROPOFOL;  Surgeon: Manya Silvas, MD;  Location: [facility] ENDOSCOPY;  Service: Endoscopy;  Laterality: N/A;   CORONARY ANGIOPLASTY     EYE SURGERY  07/20/13   cataract   HERNIA  REPAIR Right 2008 & 2012   VASECTOMY  1984   Family History  Problem Relation Age of Onset   Arthritis Mother    Heart disease Mother    Stroke Mother    Hypertension Mother    Arthritis Father    Hyperlipidemia Father    Heart disease Father    Hypertension Father    Parkinson's disease Father    Multiple sclerosis Sister    Colon cancer Maternal Grandmother    Arthritis Paternal Grandmother    Colon cancer Paternal Grandmother    Social History   Socioeconomic History   Marital status: Married    Spouse name: Not on file   Number of children: Not on file   Years of education: Not on file   Highest education level: Not on file  Occupational History   Not on file  Tobacco Use   Smoking status: Never   Smokeless tobacco: Never  Vaping Use   Vaping Use: Never used  Substance and Sexual Activity   Alcohol use: No    Alcohol/week: 0.0 standard drinks   Drug use: No   Sexual activity: Never  Other Topics Concern   Not on file  Social History Narrative   Not on file   Social Determinants of Health   Financial Resource Strain: Low Risk    Difficulty of Paying Living Expenses: Not hard at all  Food Insecurity:  No Food Insecurity   Worried About Charity fundraiser in the Last Year: Never true   Ran Out of Food in the Last Year: Never true  Transportation Needs: No Transportation Needs   Lack of Transportation (Medical): No   Lack of Transportation (Non-Medical): No  Physical Activity: Not on file  Stress: No Stress Concern Present   Feeling of Stress : Not at all  Social Connections: Unknown   Frequency of Communication with Friends and Family: Not on file   Frequency of Social Gatherings with Friends and Family: Not on file   Attends Religious Services: Not on file   Active Member of Clubs or Organizations: Not on file   Attends Archivist Meetings: Not on file   Marital Status: Married     Review of Systems  Constitutional:  Negative for  appetite change and unexpected weight change.  HENT:  Negative for congestion and sinus pressure.   Respiratory:  Negative for cough, chest tightness and shortness of breath.   Cardiovascular:  Negative for chest pain, palpitations and leg swelling.  Gastrointestinal:  Negative for abdominal pain, diarrhea, nausea and vomiting.  Genitourinary:  Negative for difficulty urinating and dysuria.  Musculoskeletal:  Negative for joint swelling and myalgias.  Skin:  Negative for color change and rash.  Neurological:  Negative for dizziness, light-headedness and headaches.  Psychiatric/Behavioral:  Negative for agitation and dysphoric mood.       Objective:     BP 118/72    Pulse 72    Ht 5' 7.99" (1.727 m)    Wt 173 lb 9.6 oz (78.7 kg)    SpO2 98%    BMI 26.40 kg/m  Wt Readings from Last 3 Encounters:  09/16/21 173 lb 9.6 oz (78.7 kg)  05/15/21 171 lb 3.2 oz (77.7 kg)  02/14/21 171 lb (77.6 kg)    Physical Exam Constitutional:      General: He is not in acute distress.    Appearance: Normal appearance. He is well-developed.  HENT:     Head: Normocephalic and atraumatic.     Right Ear: External ear normal.     Left Ear: External ear normal.  Eyes:     General: No scleral icterus.       Right eye: No discharge.        Left eye: No discharge.  Cardiovascular:     Rate and Rhythm: Normal rate and regular rhythm.  Pulmonary:     Effort: Pulmonary effort is normal. No respiratory distress.     Breath sounds: Normal breath sounds.  Abdominal:     General: Bowel sounds are normal.     Palpations: Abdomen is soft.     Tenderness: There is no abdominal tenderness.  Musculoskeletal:        General: No swelling or tenderness.     Cervical back: Neck supple. No tenderness.  Lymphadenopathy:     Cervical: No cervical adenopathy.  Skin:    Findings: No erythema or rash.  Neurological:     Mental Status: He is alert.  Psychiatric:        Mood and Affect: Mood normal.        Behavior:  Behavior normal.     Outpatient Encounter Medications as of 09/16/2021  Medication Sig   aspirin 81 MG tablet Take 81 mg by mouth at bedtime.   azelastine (ASTELIN) 0.1 % nasal spray Place 1 spray into both nostrils 2 (two) times daily.   Cholecalciferol (VITAMIN D3) 2000  UNITS TABS Take 2,000 Units by mouth daily.    clopidogrel (PLAVIX) 75 MG tablet Take 75 mg by mouth at bedtime.    latanoprost (XALATAN) 0.005 % ophthalmic solution Place 1 drop into both eyes at bedtime.   levETIRAcetam (KEPPRA XR) 500 MG 24 hr tablet Take 1,000 mg by mouth daily.   levocetirizine (XYZAL) 5 MG tablet TAKE 1 TABLET EVERY EVENING   magnesium oxide (MAG-OX) 400 MG tablet Take 400 mg by mouth at bedtime.   Multiple Vitamin (MULTIVITAMIN) tablet Take 1 tablet by mouth daily.   mupirocin ointment (BACTROBAN) 2 % Apply 1 application topically 2 (two) times daily. Apply in the nose two times per day.   omeprazole (PRILOSEC) 20 MG capsule TAKE 1 CAPSULE TWICE DAILY BEFORE MEALS   polyethylene glycol (MIRALAX / GLYCOLAX) 17 g packet Take 17 g by mouth daily.   Saw Palmetto 450 MG CAPS Take 450 mg by mouth daily.    simvastatin (ZOCOR) 40 MG tablet Take 40 mg by mouth every evening.    triamcinolone (KENALOG) 0.1 % Apply 1 application topically 2 (two) times daily.   [DISCONTINUED] fluticasone (FLONASE) 50 MCG/ACT nasal spray USE 2 SPRAYS IN EACH NOSTRIL EVERY DAY   No facility-administered encounter medications on file as of 09/16/2021.     Lab Results  Component Value Date   WBC 7.6 12/31/2020   HGB 14.4 12/31/2020   HCT 42.2 12/31/2020   PLT 211 12/31/2020   GLUCOSE 104 (H) 09/13/2021   CHOL 144 09/13/2021   TRIG 152.0 (H) 09/13/2021   HDL 34.60 (L) 09/13/2021   LDLDIRECT 99.1 07/29/2013   LDLCALC 79 09/13/2021   ALT 18 09/13/2021   AST 23 09/13/2021   NA 139 09/13/2021   K 4.1 09/13/2021   CL 105 09/13/2021   CREATININE 1.01 09/13/2021   BUN 15 09/13/2021   CO2 25 09/13/2021   TSH 2.72  09/13/2021   PSA 1.19 09/13/2021   HGBA1C 5.6 09/13/2021    DG Ribs Unilateral W/Chest Right  Result Date: 12/31/2020 CLINICAL DATA:  Struck chest wall on handle of treadmill on Wednesday EXAM: RIGHT RIBS AND CHEST - 3+ VIEW COMPARISON:  None. FINDINGS: Some abrupt cortical angulation noted along the posterolateral sixth through eighth right ribs which could reflect some minimally displaced rib fractures in the setting of acute chest wall trauma. No associated pneumothorax or visible effusion. No focal consolidative process or convincing edema. The cardiomediastinal contours are unremarkable. No other acute osseous or soft tissue abnormalities. IMPRESSION: Abrupt angulation of the posterolateral sixth through eighth ribs, could reflect minimally displaced fractures. No associated pneumothorax or effusion. Electronically Signed   By: Lovena Le M.D.   On: 12/31/2020 06:26   CT CHEST ABDOMEN PELVIS WO CONTRAST  Result Date: 12/31/2020 CLINICAL DATA:  81 year old male status post blunt trauma last week. Increased pain with movement. EXAM: CT CHEST, ABDOMEN AND PELVIS WITHOUT CONTRAST TECHNIQUE: Multidetector CT imaging of the chest, abdomen and pelvis was performed following the standard protocol without IV contrast. COMPARISON:  Chest and right rib series earlier today. CT Abdomen and Pelvis 07/18/2019. FINDINGS: CT CHEST FINDINGS Cardiovascular: Normal caliber of the thoracic aorta with mild calcified atherosclerosis. No evidence of periaortic hematoma on this noncontrast exam. Cardiac size within normal limits. Calcified coronary artery atherosclerosis. No pericardial effusion. Vascular patency is not evaluated in the absence of IV contrast. Mediastinum/Nodes: Small chronic hiatal hernia. No mediastinal hematoma or lymphadenopathy in the absence of IV contrast. Lungs/Pleura: Major airways are patent. Improved  lung base ventilation compared to 2020. Mild residual scarring or atelectasis on the right. No  pneumothorax, pleural effusion, pulmonary contusion. Musculoskeletal: Osteopenia. Minimally displaced fracture right posterolateral 9th rib (series 4, image 122. Otherwise there is only subtle angulation of the right lateral 5th through 8th ribs, indeterminate for nondisplaced fractures. Intact sternum. Visible shoulder osseous structures appear intact. Mild lower thoracic endplate deformities appear stable since 2020. No convincing acute thoracic vertebral fracture. CT ABDOMEN PELVIS FINDINGS Hepatobiliary: Absent gallbladder now. Negative noncontrast liver. No perihepatic fluid. Pancreas: Negative. Spleen: Negative noncontrast spleen.  No perisplenic fluid. Adrenals/Urinary Tract: Normal adrenal glands. Noncontrast kidneys appear stable since 2020 and nonobstructed. Small chronic right renal upper pole cyst. Decompressed and negative ureters. Unremarkable urinary bladder. Stomach/Bowel: Extensive diverticulosis from the descending through the sigmoid colon. No active inflammation. Otherwise redundant large bowel with intermittent retained stool. Mild diverticula in the right colon. Normal appendix on coronal image 78. Decompressed and negative terminal ileum. No dilated small bowel. Decompressed stomach and duodenum. No free air, free fluid, mesenteric inflammation. Vascular/Lymphatic: Aortoiliac calcified atherosclerosis. Abdominal aortic caliber remains within normal limits. Vascular patency is not evaluated in the absence of IV contrast. No lymphadenopathy. Reproductive: Negative. Other: No pelvic free fluid. Musculoskeletal: Lumbar vertebrae, sacrum, SI joints, pelvis and proximal femurs appear stable and intact. IMPRESSION: 1. Minimally displaced fracture of the right posterolateral 9th rib. Underlying osteopenia. Difficult to exclude nondisplaced rib fractures at several adjacent levels. 2. No other acute traumatic injury identified in the noncontrast chest, abdomen, or pelvis. 3. Cholecystectomy since 2020.  Aortic Atherosclerosis (ICD10-I70.0). Small chronic hiatal hernia. Distal large bowel diverticulosis. Electronically Signed   By: Genevie Ann M.D.   On: 12/31/2020 08:08       Assessment & Plan:   Problem List Items Addressed This Visit     Anxiety    Appears to be doing well.  Off lorazepam.        Aortic atherosclerosis (HCC)    Continue simvastatin.  Low cholesterol diet and exercise.  Follow lipid panel and liver function tests.       CAD (coronary artery disease)    Cath as outlined. Saw cardiology 05/20/21 - f/u CAD s/p PTCA/PCI.  Had been on aspirin and plavix.  Recommended aspirin only.  Off plavix now.  Continue risk factor modification.  Currently asymptomatic.        GERD (gastroesophageal reflux disease)    Continue omeprazole.  No upper symptoms reported.       Hypercholesterolemia    Continue simvastatin.  Low cholesterol diet and exercise.  Follow lipid panel and liver function tests.       Hyperglycemia    Low carb diet and exercise.  Follow met b and a1c.       Seizure disorder ([place])    Doing well on keppra.  Followed by neurology.       Sleep apnea    Saw neurology.  Ordered HST. Home sleep study showing moderate, positional Obstructive Sleep Apnea. Insufficient non-supine study time to assess severity of non-positional Obstructive Sleep Apnea. Per note, initiated a referral for PAP therapy initiation (AutoPAP 5 to 20 cm H2O).  Pt had questions regarding sleep study and was unaware of above recommendation.  Contact neurology for f/u.         Einar Pheasant, MD

## 2021-09-20 ENCOUNTER — Other Ambulatory Visit: Payer: Self-pay | Admitting: Internal Medicine

## 2021-09-22 ENCOUNTER — Telehealth: Payer: Self-pay | Admitting: Internal Medicine

## 2021-09-22 ENCOUNTER — Encounter: Payer: Self-pay | Admitting: Internal Medicine

## 2021-09-22 DIAGNOSIS — G4730 Sleep apnea, unspecified: Secondary | ICD-10-CM | POA: Insufficient documentation

## 2021-09-22 NOTE — Assessment & Plan Note (Signed)
Doing well on keppra.  Followed by neurology.  

## 2021-09-22 NOTE — Assessment & Plan Note (Signed)
Low carb diet and exercise.  Follow met b and a1c.

## 2021-09-22 NOTE — Telephone Encounter (Signed)
Bone density ordered 05/2021.  Needs to be scheduled.  Thanks.  Also, saw neurology and home sleep test performed.  He had questions.  Per neurology review, Home sleep study showing moderate, positional Obstructive Sleep Apnea. Per note, initiated a referral for PAP therapy initiation (AutoPAP 5 to 20 cm H2O).  Pt had questions regarding sleep study and was unaware of above recommendation.  Please schedule f/u with neurology to discuss.  Saw [facility]  (Dr Manuella Ghazi).  [facility] is no longer there, but Dr Manuella Ghazi and Verline Lema are there.

## 2021-09-22 NOTE — Assessment & Plan Note (Addendum)
Cath as outlined. Saw cardiology 05/20/21 - f/u CAD s/p PTCA/PCI.  Had been on aspirin and plavix.  Recommended aspirin only.  Off plavix now.  Continue risk factor modification.  Currently asymptomatic.

## 2021-09-22 NOTE — Assessment & Plan Note (Signed)
Appears to be doing well.  Off lorazepam.

## 2021-09-22 NOTE — Assessment & Plan Note (Signed)
Continue omeprazole.  No upper symptoms reported.

## 2021-09-22 NOTE — Assessment & Plan Note (Signed)
Saw neurology.  Ordered HST. Home sleep study showing moderate, positional Obstructive Sleep Apnea. Insufficient non-supine study time to assess severity of non-positional Obstructive Sleep Apnea. Per note, initiated a referral for PAP therapy initiation (AutoPAP 5 to 20 cm H2O).  Pt had questions regarding sleep study and was unaware of above recommendation.  Contact neurology for f/u.

## 2021-09-22 NOTE — Assessment & Plan Note (Signed)
Continue simvastatin.  Low cholesterol diet and exercise.  Follow lipid panel and liver function tests.   

## 2021-09-25 NOTE — Telephone Encounter (Signed)
Patient is going to schedule his own bone density. Number provided. Scheduled neurology appt for 1/27 at 2:15. Patient is aware.

## 2021-09-27 DIAGNOSIS — G4733 Obstructive sleep apnea (adult) (pediatric): Secondary | ICD-10-CM | POA: Diagnosis not present

## 2021-09-27 DIAGNOSIS — R569 Unspecified convulsions: Secondary | ICD-10-CM | POA: Diagnosis not present

## 2021-11-05 ENCOUNTER — Other Ambulatory Visit: Payer: Self-pay

## 2021-11-05 ENCOUNTER — Ambulatory Visit
Admission: RE | Admit: 2021-11-05 | Discharge: 2021-11-05 | Disposition: A | Discharge status: Home / Self Care | Payer: Medicare HMO | Source: Ambulatory Visit | Attending: Internal Medicine | Admitting: Internal Medicine

## 2021-11-05 DIAGNOSIS — Z13820 Encounter for screening for osteoporosis: Secondary | ICD-10-CM | POA: Insufficient documentation

## 2021-11-05 DIAGNOSIS — M810 Age-related osteoporosis without current pathological fracture: Secondary | ICD-10-CM | POA: Insufficient documentation

## 2021-11-05 DIAGNOSIS — X58XXXD Exposure to other specified factors, subsequent encounter: Secondary | ICD-10-CM | POA: Diagnosis not present

## 2021-11-05 DIAGNOSIS — S2239XD Fracture of one rib, unspecified side, subsequent encounter for fracture with routine healing: Secondary | ICD-10-CM | POA: Insufficient documentation

## 2021-11-05 DIAGNOSIS — M8588 Other specified disorders of bone density and structure, other site: Secondary | ICD-10-CM | POA: Diagnosis not present

## 2021-11-08 ENCOUNTER — Other Ambulatory Visit: Payer: Self-pay

## 2021-11-08 ENCOUNTER — Ambulatory Visit (INDEPENDENT_AMBULATORY_CARE_PROVIDER_SITE_OTHER): Payer: Medicare HMO | Admitting: Internal Medicine

## 2021-11-08 DIAGNOSIS — R739 Hyperglycemia, unspecified: Secondary | ICD-10-CM

## 2021-11-08 DIAGNOSIS — E7800 Pure hypercholesterolemia, unspecified: Secondary | ICD-10-CM

## 2021-11-08 DIAGNOSIS — Z860100 Personal history of colon polyps, unspecified: Secondary | ICD-10-CM

## 2021-11-08 DIAGNOSIS — I2510 Atherosclerotic heart disease of native coronary artery without angina pectoris: Secondary | ICD-10-CM | POA: Diagnosis not present

## 2021-11-08 DIAGNOSIS — M810 Age-related osteoporosis without current pathological fracture: Secondary | ICD-10-CM | POA: Diagnosis not present

## 2021-11-08 DIAGNOSIS — K219 Gastro-esophageal reflux disease without esophagitis: Secondary | ICD-10-CM | POA: Diagnosis not present

## 2021-11-08 DIAGNOSIS — G40909 Epilepsy, unspecified, not intractable, without status epilepticus: Secondary | ICD-10-CM

## 2021-11-08 DIAGNOSIS — I700 Atherosclerosis of aorta: Secondary | ICD-10-CM | POA: Diagnosis not present

## 2021-11-08 MED ORDER — ALENDRONATE SODIUM 70 MG PO TABS: 70.0000 mg | ORAL_TABLET | ORAL | 11 refills | Status: DC

## 2021-11-08 NOTE — Progress Notes (Signed)
Patient ID: GONSALO CUTHBERTSON, male   DOB: 02-11-1941, 81 y.o.   MRN: [id number]   Subjective:    Patient ID: WITT PLITT, male    DOB: Jan 25, 1941, 81 y.o.   MRN: [id number]  This visit occurred during the SARS-CoV-2 public health emergency.  Safety protocols were in place, including screening questions prior to the visit, additional usage of staff PPE, and extensive cleaning of exam room while observing appropriate contact time as indicated for disinfecting solutions.   Patient here for work in to discuss bone density results.   Chief Complaint  Patient presents with   Osteoporosis   .   HPI Here for work in to discuss bone density.  He is accompanied by his wife.  Discussed with both of them and questions answered.  Bone density - osteoporosis.  Discussed results.  Discussed treatment options, including bisphosphonates (oral), reclast and prolia.  Discussed taking calcium and vitamin D and weight bearing exercise.  Questions answered.     Past Medical History:  Diagnosis Date   Allergy    Arthritis    Coronary artery disease    Diverticulosis of colon    GERD (gastroesophageal reflux disease)    ESOPHAGEAL REFLUX   Glaucoma    Gout    Hemorrhoids    History of kidney stones    Hx of colonic polyp    Hypercholesterolemia    Hypertension    Schatzki's ring    Seizures ([place])    Past Surgical History:  Procedure Laterality Date   CARDIAC CATHETERIZATION     COLONOSCOPY WITH PROPOFOL N/A 05/18/2017   Procedure: COLONOSCOPY WITH PROPOFOL;  Surgeon: Manya Silvas, MD;  Location: [facility] ENDOSCOPY;  Service: Endoscopy;  Laterality: N/A;   CORONARY ANGIOPLASTY     EYE SURGERY  07/20/13   cataract   HERNIA REPAIR Right 2008 & 2012   VASECTOMY  1984   Family History  Problem Relation Age of Onset   Arthritis Mother    Heart disease Mother    Stroke Mother    Hypertension Mother    Arthritis Father    Hyperlipidemia Father    Heart disease Father    Hypertension  Father    Parkinson's disease Father    Multiple sclerosis Sister    Colon cancer Maternal Grandmother    Arthritis Paternal Grandmother    Colon cancer Paternal Grandmother    Social History   Socioeconomic History   Marital status: Married    Spouse name: Not on file   Number of children: Not on file   Years of education: Not on file   Highest education level: Not on file  Occupational History   Not on file  Tobacco Use   Smoking status: Never   Smokeless tobacco: Never  Vaping Use   Vaping Use: Never used  Substance and Sexual Activity   Alcohol use: No    Alcohol/week: 0.0 standard drinks   Drug use: No   Sexual activity: Never  Other Topics Concern   Not on file  Social History Narrative   Not on file   Social Determinants of Health   Financial Resource Strain: Low Risk    Difficulty of Paying Living Expenses: Not hard at all  Food Insecurity: No Food Insecurity   Worried About Charity fundraiser in the Last Year: Never true   [place] in the Last Year: Never true  Transportation Needs: No Transportation Needs   Lack of Transportation (Medical): No  Lack of Transportation (Non-Medical): No  Physical Activity: Not on file  Stress: No Stress Concern Present   Feeling of Stress : Not at all  Social Connections: Unknown   Frequency of Communication with Friends and Family: Not on file   Frequency of Social Gatherings with Friends and Family: Not on file   Attends Religious Services: Not on file   Active Member of Clubs or Organizations: Not on file   Attends Archivist Meetings: Not on file   Marital Status: Married     Review of Systems  Constitutional:  Negative for appetite change and unexpected weight change.  HENT:  Negative for congestion and sinus pressure.   Respiratory:  Negative for cough, chest tightness and shortness of breath.   Cardiovascular:  Negative for chest pain, palpitations and leg swelling.  Gastrointestinal:   Negative for abdominal pain, diarrhea, nausea and vomiting.  Genitourinary:  Negative for difficulty urinating and dysuria.  Musculoskeletal:  Negative for joint swelling and myalgias.  Skin:  Negative for color change and rash.  Neurological:  Negative for dizziness, light-headedness and headaches.  Psychiatric/Behavioral:  Negative for agitation and dysphoric mood.       Objective:     BP 130/70    Pulse 89    Temp 98 F (36.7 C)    Resp 16    Ht 5' 8"  (1.727 m)    Wt 174 lb 3.2 oz (79 kg)    SpO2 97%    BMI 26.49 kg/m  Wt Readings from Last 3 Encounters:  11/08/21 174 lb 3.2 oz (79 kg)  09/16/21 173 lb 9.6 oz (78.7 kg)  05/15/21 171 lb 3.2 oz (77.7 kg)    Physical Exam Constitutional:      General: He is not in acute distress.    Appearance: Normal appearance. He is well-developed.  HENT:     Head: Normocephalic and atraumatic.     Right Ear: External ear normal.     Left Ear: External ear normal.  Eyes:     General: No scleral icterus.       Right eye: No discharge.        Left eye: No discharge.  Cardiovascular:     Rate and Rhythm: Normal rate and regular rhythm.  Pulmonary:     Effort: Pulmonary effort is normal. No respiratory distress.     Breath sounds: Normal breath sounds.  Abdominal:     General: Bowel sounds are normal.     Palpations: Abdomen is soft.     Tenderness: There is no abdominal tenderness.  Musculoskeletal:        General: No swelling or tenderness.     Cervical back: Neck supple. No tenderness.  Lymphadenopathy:     Cervical: No cervical adenopathy.  Skin:    Findings: No erythema or rash.  Neurological:     Mental Status: He is alert.  Psychiatric:        Mood and Affect: Mood normal.        Behavior: Behavior normal.     Outpatient Encounter Medications as of 11/08/2021  Medication Sig   alendronate (FOSAMAX) 70 MG tablet Take 1 tablet (70 mg total) by mouth every 7 (seven) days. Take with a full glass of water on an empty stomach.    aspirin 81 MG tablet Take 81 mg by mouth at bedtime.   azelastine (ASTELIN) 0.1 % nasal spray Place 1 spray into both nostrils 2 (two) times daily.   Cholecalciferol (VITAMIN D3)  2000 UNITS TABS Take 2,000 Units by mouth daily.    fluticasone (FLONASE) 50 MCG/ACT nasal spray USE 2 SPRAYS IN EACH NOSTRIL EVERY DAY   latanoprost (XALATAN) 0.005 % ophthalmic solution Place 1 drop into both eyes at bedtime.   levETIRAcetam (KEPPRA XR) 500 MG 24 hr tablet Take 1,000 mg by mouth daily.   levocetirizine (XYZAL) 5 MG tablet TAKE 1 TABLET EVERY EVENING   magnesium oxide (MAG-OX) 400 MG tablet Take 400 mg by mouth at bedtime.   Multiple Vitamin (MULTIVITAMIN) tablet Take 1 tablet by mouth daily.   mupirocin ointment (BACTROBAN) 2 % Apply 1 application topically 2 (two) times daily. Apply in the nose two times per day.   omeprazole (PRILOSEC) 20 MG capsule TAKE 1 CAPSULE TWICE DAILY BEFORE MEALS   polyethylene glycol (MIRALAX / GLYCOLAX) 17 g packet Take 17 g by mouth daily.   Saw Palmetto 450 MG CAPS Take 450 mg by mouth daily.    simvastatin (ZOCOR) 40 MG tablet Take 40 mg by mouth every evening.    triamcinolone (KENALOG) 0.1 % Apply 1 application topically 2 (two) times daily.   [DISCONTINUED] clopidogrel (PLAVIX) 75 MG tablet Take 75 mg by mouth at bedtime.    No facility-administered encounter medications on file as of 11/08/2021.     Lab Results  Component Value Date   WBC 7.6 12/31/2020   HGB 14.4 12/31/2020   HCT 42.2 12/31/2020   PLT 211 12/31/2020   GLUCOSE 104 (H) 09/13/2021   CHOL 144 09/13/2021   TRIG 152.0 (H) 09/13/2021   HDL 34.60 (L) 09/13/2021   LDLDIRECT 99.1 07/29/2013   LDLCALC 79 09/13/2021   ALT 18 09/13/2021   AST 23 09/13/2021   NA 139 09/13/2021   K 4.1 09/13/2021   CL 105 09/13/2021   CREATININE 1.01 09/13/2021   BUN 15 09/13/2021   CO2 25 09/13/2021   TSH 2.72 09/13/2021   PSA 1.19 09/13/2021   HGBA1C 5.6 09/13/2021    DG Bone Density  Result Date:  11/05/2021 EXAM: DUAL X-RAY ABSORPTIOMETRY (DXA) FOR BONE MINERAL DENSITY IMPRESSION: Your patient Hashem Goynes completed a BMD test on 11/05/2021 using the [place] (software version: 14.10) manufactured by [employer]. The following summarizes the results of our evaluation. Technologist:VLM PATIENT BIOGRAPHICAL: Name: Thurmon, Mizell Patient ID: [id number] Birth Date: [birth year] Height: 67.0 in. Gender: Male Exam Date: 11/05/2021 Weight: 168.0 lbs. Indications: Caucasian, Seizures Fractures:  Ribs       Treatments: DENSITOMETRY RESULTS: Site      Region     Measured Date Measured Age WHO Classification Young Adult T-score BMD         %Change vs. Previous Significant Change (*) AP Spine L1-L4 11/05/20[age].4 Osteopenia -2.4 0.939 g/cm2 - - DualFemur Neck Right 11/05/2021 80.4 Osteoporosis -3.2 0.659 g/cm2 - - ASSESSMENT: The BMD measured at Femur Neck Right is 0.659 g/cm2 with a T-score of -3.2. This patient is considered osteoporotic according to [facility]) criteria. The scan quality is good. World Pharmacologist [facility]) criteria for post-menopausal, Caucasian Women: Normal:                   T-score at or above -1 SD Osteopenia/low bone mass: T-score between -1 and -2.5 SD Osteoporosis:             T-score at or below -2.5 SD RECOMMENDATIONS: 1. All patients should optimize calcium and vitamin D intake. 2. Consider FDA-approved medical therapies in  postmenopausal women and men aged 22 years and older, based on the following: a. A hip or vertebral(clinical or morphometric) fracture b. T-score < -2.5 at the femoral neck or spine after appropriate evaluation to exclude secondary causes c. Low bone mass (T-score between -1.0 and -2.5 at the femoral neck or spine) and a 10-year probability of a hip fracture > 3% or a 10-year probability of a major osteoporosis-related fracture > 20% based on the [place]-adapted WHO algorithm 3. Clinician judgment and/or patient  preferences may indicate treatment for people with 10-year fracture probabilities above or below these levels FOLLOW-UP: People with diagnosed cases of osteoporosis or osteopenia should be regularly tested for bone mineral density. For patients eligible for Medicare, routine testing is allowed once every 2 years. The testing frequency can be increased to one year for patients who have rapidly progressing disease, or for those who are receiving medical therapy to restore bone mass. I have reviewed this report, and agree with the above findings. [facility] Electronically Signed   By: Elmer Picker M.D.   On: 11/05/2021 10:16       Assessment & Plan:   Problem List Items Addressed This Visit     Aortic atherosclerosis ([place])    Continue simvastatin.  Low cholesterol diet and exercise.       CAD (coronary artery disease)    Cath as outlined. Saw cardiology 05/20/21 - f/u CAD s/p PTCA/PCI.  Had been on aspirin and plavix.  Recommended aspirin only.  Off plavix now.  Continue risk factor modification.  Currently asymptomatic.        GERD (gastroesophageal reflux disease)    Continue omeprazole.  No upper symptoms reported. No swallowing problems.  Follow.       History of colonic polyps    Colonoscopy 05/2017 - pathology - moderate active colitis, tubular adenoma.       Hypercholesterolemia    Continue simvastatin.  Low cholesterol diet and exercise.  Follow lipid panel and liver function tests.       Hyperglycemia    Low carb diet and exercise.  Follow met b and a1c.       Osteoporosis    Discussed bone density.  Discussed treatment options as outlined.  Elected to start fosamax q week.  Discussed proper way to take the medication and possible side effects.  Follow.       Relevant Medications   alendronate (FOSAMAX) 70 MG tablet   Seizure disorder ([facility])    Doing well on keppra.  Followed by neurology.         Einar Pheasant, MD

## 2021-11-11 ENCOUNTER — Encounter: Payer: Self-pay | Admitting: Internal Medicine

## 2021-11-11 DIAGNOSIS — M810 Age-related osteoporosis without current pathological fracture: Secondary | ICD-10-CM | POA: Insufficient documentation

## 2021-11-11 NOTE — Assessment & Plan Note (Signed)
Discussed bone density.  Discussed treatment options as outlined.  Elected to start fosamax q week.  Discussed proper way to take the medication and possible side effects.  Follow.  ?

## 2021-11-11 NOTE — Assessment & Plan Note (Signed)
Continue simvastatin.  Low cholesterol diet and exercise.  Follow lipid panel and liver function tests.   

## 2021-11-11 NOTE — Assessment & Plan Note (Signed)
Cath as outlined. Saw cardiology 05/20/21 - f/u CAD s/p PTCA/PCI.  Had been on aspirin and plavix.  Recommended aspirin only.  Off plavix now.  Continue risk factor modification.  Currently asymptomatic.   ?

## 2021-11-11 NOTE — Assessment & Plan Note (Signed)
Colonoscopy 05/2017 - pathology - moderate active colitis, tubular adenoma.  ?

## 2021-11-11 NOTE — Assessment & Plan Note (Signed)
Continue omeprazole.  No upper symptoms reported. No swallowing problems.  Follow.  

## 2021-11-11 NOTE — Assessment & Plan Note (Signed)
Continue simvastatin.  Low cholesterol diet and exercise.  

## 2021-11-11 NOTE — Assessment & Plan Note (Signed)
Doing well on keppra.  Followed by neurology.  

## 2021-11-11 NOTE — Assessment & Plan Note (Signed)
Low carb diet and exercise.  Follow met b and a1c.  ?

## 2021-11-18 DIAGNOSIS — I10 Essential (primary) hypertension: Secondary | ICD-10-CM | POA: Diagnosis not present

## 2021-11-18 DIAGNOSIS — I2510 Atherosclerotic heart disease of native coronary artery without angina pectoris: Secondary | ICD-10-CM | POA: Diagnosis not present

## 2021-11-18 DIAGNOSIS — Z9861 Coronary angioplasty status: Secondary | ICD-10-CM | POA: Diagnosis not present

## 2021-12-23 DIAGNOSIS — H40003 Preglaucoma, unspecified, bilateral: Secondary | ICD-10-CM | POA: Diagnosis not present

## 2021-12-23 DIAGNOSIS — Z961 Presence of intraocular lens: Secondary | ICD-10-CM | POA: Diagnosis not present

## 2022-01-01 ENCOUNTER — Telehealth: Payer: Self-pay | Admitting: Internal Medicine

## 2022-01-01 DIAGNOSIS — R739 Hyperglycemia, unspecified: Secondary | ICD-10-CM

## 2022-01-01 DIAGNOSIS — I10 Essential (primary) hypertension: Secondary | ICD-10-CM

## 2022-01-01 DIAGNOSIS — E7800 Pure hypercholesterolemia, unspecified: Secondary | ICD-10-CM

## 2022-01-01 NOTE — Telephone Encounter (Signed)
Patient has a lab appt 01/09/2022, there are no orders in. ?

## 2022-01-02 NOTE — Telephone Encounter (Signed)
Orders placed for labs

## 2022-01-02 NOTE — Addendum Note (Signed)
Addended by: Alisa Graff on: 01/02/2022 04:03 AM ? ? Modules accepted: Orders ? ?

## 2022-01-08 ENCOUNTER — Other Ambulatory Visit (INDEPENDENT_AMBULATORY_CARE_PROVIDER_SITE_OTHER): Payer: Medicare HMO

## 2022-01-08 DIAGNOSIS — I10 Essential (primary) hypertension: Secondary | ICD-10-CM | POA: Diagnosis not present

## 2022-01-08 DIAGNOSIS — E7800 Pure hypercholesterolemia, unspecified: Secondary | ICD-10-CM

## 2022-01-08 DIAGNOSIS — R739 Hyperglycemia, unspecified: Secondary | ICD-10-CM | POA: Diagnosis not present

## 2022-01-08 LAB — BASIC METABOLIC PANEL
BUN: 15 mg/dL (ref 6–23)
CO2: 25 mEq/L (ref 19–32)
Calcium: 9.3 mg/dL (ref 8.4–10.5)
Chloride: 104 mEq/L (ref 96–112)
Creatinine, Ser: 1.14 mg/dL (ref 0.40–1.50)
GFR: 60.71 mL/min (ref >60.00)
Glucose, Bld: 108 mg/dL — ABNORMAL HIGH (ref 70–99)
Potassium: 3.9 mEq/L (ref 3.5–5.1)
Sodium: 138 mEq/L (ref 135–145)

## 2022-01-08 LAB — CBC WITH DIFFERENTIAL/PLATELET
Basophils Absolute: 0 10*3/uL (ref 0.0–0.1)
Basophils Relative: 0.7 % (ref 0.0–3.0)
Eosinophils Absolute: 0.3 10*3/uL (ref 0.0–0.7)
Eosinophils Relative: 4.3 % (ref 0.0–5.0)
HCT: 40.9 % (ref 39.0–52.0)
Hemoglobin: 14.2 g/dL (ref 13.0–17.0)
Lymphocytes Relative: 28.2 % (ref 12.0–46.0)
Lymphs Abs: 1.7 10*3/uL (ref 0.7–4.0)
MCHC: 34.7 g/dL (ref 30.0–36.0)
MCV: 84.8 fl (ref 78.0–100.0)
Monocytes Absolute: 0.6 10*3/uL (ref 0.1–1.0)
Monocytes Relative: 10.1 % (ref 3.0–12.0)
Neutro Abs: 3.3 10*3/uL (ref 1.4–7.7)
Neutrophils Relative %: 56.7 % (ref 43.0–77.0)
Platelets: 205 10*3/uL (ref 150.0–400.0)
RBC: 4.83 Mil/uL (ref 4.22–5.81)
RDW: 13.6 % (ref 11.5–15.5)
WBC: 5.9 10*3/uL (ref 4.0–10.5)

## 2022-01-08 LAB — HEPATIC FUNCTION PANEL
ALT: 17 U/L (ref 0–53)
AST: 21 U/L (ref 0–37)
Albumin: 4.1 g/dL (ref 3.5–5.2)
Alkaline Phosphatase: 52 U/L (ref 39–117)
Bilirubin, Direct: 0.2 mg/dL (ref 0.0–0.3)
Total Bilirubin: 1 mg/dL (ref 0.2–1.2)
Total Protein: 7.1 g/dL (ref 6.0–8.3)

## 2022-01-08 LAB — HEMOGLOBIN A1C: Hgb A1c MFr Bld: 5.5 % (ref 4.6–6.5)

## 2022-01-08 LAB — LIPID PANEL
Cholesterol: 125 mg/dL (ref 0–200)
HDL: 31.6 mg/dL — ABNORMAL LOW (ref >39.00)
LDL Cholesterol: 67 mg/dL (ref 0–99)
NonHDL: 93.02
Total CHOL/HDL Ratio: 4
Triglycerides: 128 mg/dL (ref 0.0–149.0)
VLDL: 25.6 mg/dL (ref 0.0–40.0)

## 2022-01-09 ENCOUNTER — Other Ambulatory Visit: Payer: Medicare HMO

## 2022-01-14 ENCOUNTER — Ambulatory Visit (INDEPENDENT_AMBULATORY_CARE_PROVIDER_SITE_OTHER): Payer: Medicare HMO | Admitting: Internal Medicine

## 2022-01-14 ENCOUNTER — Encounter: Payer: Self-pay | Admitting: Internal Medicine

## 2022-01-14 DIAGNOSIS — Z9109 Other allergy status, other than to drugs and biological substances: Secondary | ICD-10-CM

## 2022-01-14 DIAGNOSIS — E7800 Pure hypercholesterolemia, unspecified: Secondary | ICD-10-CM | POA: Diagnosis not present

## 2022-01-14 DIAGNOSIS — I700 Atherosclerosis of aorta: Secondary | ICD-10-CM

## 2022-01-14 DIAGNOSIS — Z86010 Personal history of colonic polyps: Secondary | ICD-10-CM

## 2022-01-14 DIAGNOSIS — M810 Age-related osteoporosis without current pathological fracture: Secondary | ICD-10-CM | POA: Diagnosis not present

## 2022-01-14 DIAGNOSIS — G4730 Sleep apnea, unspecified: Secondary | ICD-10-CM | POA: Diagnosis not present

## 2022-01-14 DIAGNOSIS — F419 Anxiety disorder, unspecified: Secondary | ICD-10-CM

## 2022-01-14 DIAGNOSIS — R200 Anesthesia of skin: Secondary | ICD-10-CM

## 2022-01-14 DIAGNOSIS — K649 Unspecified hemorrhoids: Secondary | ICD-10-CM

## 2022-01-14 DIAGNOSIS — G40909 Epilepsy, unspecified, not intractable, without status epilepticus: Secondary | ICD-10-CM | POA: Diagnosis not present

## 2022-01-14 DIAGNOSIS — K219 Gastro-esophageal reflux disease without esophagitis: Secondary | ICD-10-CM

## 2022-01-14 DIAGNOSIS — R739 Hyperglycemia, unspecified: Secondary | ICD-10-CM

## 2022-01-14 DIAGNOSIS — I2510 Atherosclerotic heart disease of native coronary artery without angina pectoris: Secondary | ICD-10-CM | POA: Diagnosis not present

## 2022-01-14 MED ORDER — HYDROCORTISONE ACETATE 25 MG RE SUPP: 25.0000 mg | Freq: Two times a day (BID) | RECTAL | 0 refills | Status: DC

## 2022-01-14 NOTE — Progress Notes (Signed)
Patient ID: Edwin [name], male   DOB: Feb 07, 1941, 81 y.o.   MRN: [id number]   Subjective:    Patient ID: Edwin [name], male    DOB: 1940/10/19, 81 y.o.   MRN: [id number]   Patient here for a scheduled follow up     HPI Here to follow up regarding CAD, hypercholesterolemia, allergies and his blood pressure.  Saw cardiology 11/18/21 - continue aspirin, simvastatin.  No changes made. Having issues with right carpal tunnel syndrome.  Wants to hold on any further intervention at this time.  No chest pain.  Breathing stable.  Does report increased drainage and nasal congestion.  Persistent.  Was on allegra.  Discussed xyzal.  Astelin and flonase as directed.  Given persistent issues despite medication, discussed ENT referral.  Sleeping ok.  No using cpap.  Neurology discussed seeing dentist for MAD.  No acid reflux.  No abdominal pain.  Bowels stable.  Family history of colon cancer -(grandmother x 2).  Discussed bone density.  Also, issues with hemorrhoids.  Intermittent.     Past Medical History:  Diagnosis Date   Allergy    Arthritis    Coronary artery disease    Diverticulosis of colon    GERD (gastroesophageal reflux disease)    ESOPHAGEAL REFLUX   Glaucoma    Gout    Hemorrhoids    History of kidney stones    Hx of colonic polyp    Hypercholesterolemia    Hypertension    Schatzki's ring    Seizures ([place])    Past Surgical History:  Procedure Laterality Date   CARDIAC CATHETERIZATION     COLONOSCOPY WITH PROPOFOL N/A 05/18/2017   Procedure: COLONOSCOPY WITH PROPOFOL;  Surgeon: Manya Silvas, MD;  Location: [facility] ENDOSCOPY;  Service: Endoscopy;  Laterality: N/A;   CORONARY ANGIOPLASTY     EYE SURGERY  07/20/13   cataract   HERNIA REPAIR Right 2008 & 2012   VASECTOMY  1984   Family History  Problem Relation Age of Onset   Arthritis Mother    Heart disease Mother    Stroke Mother    Hypertension Mother    Arthritis Father    Hyperlipidemia Father    Heart  disease Father    Hypertension Father    Parkinson's disease Father    Multiple sclerosis Sister    Colon cancer Maternal Grandmother    Arthritis Paternal Grandmother    Colon cancer Paternal Grandmother    Social History   Socioeconomic History   Marital status: Married    Spouse name: Not on file   Number of children: Not on file   Years of education: Not on file   Highest education level: Not on file  Occupational History   Not on file  Tobacco Use   Smoking status: Never   Smokeless tobacco: Never  Vaping Use   Vaping Use: Never used  Substance and Sexual Activity   Alcohol use: No    Alcohol/week: 0.0 standard drinks   Drug use: No   Sexual activity: Never  Other Topics Concern   Not on file  Social History Narrative   Not on file   Social Determinants of Health   Financial Resource Strain: Low Risk    Difficulty of Paying Living Expenses: Not hard at all  Food Insecurity: No Food Insecurity   Worried About Charity fundraiser in the Last Year: Never true   [place] in the Last Year: Never true  Transportation  Needs: No Transportation Needs   Lack of Transportation (Medical): No   Lack of Transportation (Non-Medical): No  Physical Activity: Not on file  Stress: No Stress Concern Present   Feeling of Stress : Not at all  Social Connections: Unknown   Frequency of Communication with Friends and Family: Not on file   Frequency of Social Gatherings with Friends and Family: Not on file   Attends Religious Services: Not on file   Active Member of Clubs or Organizations: Not on file   Attends Archivist Meetings: Not on file   Marital Status: Married     Review of Systems  Constitutional:  Negative for appetite change and unexpected weight change.  HENT:  Positive for congestion and postnasal drip. Negative for sinus pressure.   Respiratory:  Negative for cough, chest tightness and shortness of breath.   Cardiovascular:  Negative for chest  pain, palpitations and leg swelling.  Gastrointestinal:  Negative for abdominal pain, diarrhea, nausea and vomiting.  Genitourinary:  Negative for difficulty urinating and dysuria.  Musculoskeletal:  Negative for joint swelling and myalgias.  Skin:  Negative for color change and rash.  Neurological:  Negative for dizziness, light-headedness and headaches.  Psychiatric/Behavioral:  Negative for agitation and dysphoric mood.       Objective:     BP 130/72 (BP Location: Left Arm, Patient Position: Sitting, Cuff Size: Small)   Pulse 74   Temp 98.1 F (36.7 C) (Temporal)   Resp 16   Ht _0  (1.727 m)   Wt 173 lb 9.6 oz (78.7 kg)   SpO2 95%   BMI 26.40 kg/m  Wt Readings from Last 3 Encounters:  01/14/22 173 lb 9.6 oz (78.7 kg)  11/08/21 174 lb 3.2 oz (79 kg)  09/16/21 173 lb 9.6 oz (78.7 kg)    Physical Exam Constitutional:      General: He is not in acute distress.    Appearance: Normal appearance. He is well-developed.  HENT:     Head: Normocephalic and atraumatic.     Right Ear: External ear normal.     Left Ear: External ear normal.  Eyes:     General: No scleral icterus.       Right eye: No discharge.        Left eye: No discharge.  Cardiovascular:     Rate and Rhythm: Normal rate and regular rhythm.  Pulmonary:     Effort: Pulmonary effort is normal. No respiratory distress.     Breath sounds: Normal breath sounds.  Abdominal:     General: Bowel sounds are normal.     Palpations: Abdomen is soft.     Tenderness: There is no abdominal tenderness.  Musculoskeletal:        General: No swelling or tenderness.     Cervical back: Neck supple. No tenderness.  Lymphadenopathy:     Cervical: No cervical adenopathy.  Skin:    Findings: No erythema or rash.  Neurological:     Mental Status: He is alert.  Psychiatric:        Mood and Affect: Mood normal.        Behavior: Behavior normal.     Outpatient Encounter Medications as of 01/14/2022  Medication Sig    alendronate (FOSAMAX) 70 MG tablet Take 1 tablet (70 mg total) by mouth every 7 (seven) days. Take with a full glass of water on an empty stomach.   aspirin 81 MG tablet Take 81 mg by mouth at bedtime.  azelastine (ASTELIN) 0.1 % nasal spray Place 1 spray into both nostrils 2 (two) times daily.   Cholecalciferol (VITAMIN D3) 2000 UNITS TABS Take 2,000 Units by mouth daily.    fluticasone (FLONASE) 50 MCG/ACT nasal spray USE 2 SPRAYS IN EACH NOSTRIL EVERY DAY   hydrocortisone (ANUSOL-HC) 25 MG suppository Place 1 suppository (25 mg total) rectally 2 (two) times daily.   latanoprost (XALATAN) 0.005 % ophthalmic solution Place 1 drop into both eyes at bedtime.   levETIRAcetam (KEPPRA XR) 500 MG 24 hr tablet Take 1,000 mg by mouth daily.   levocetirizine (XYZAL) 5 MG tablet TAKE 1 TABLET EVERY EVENING   magnesium oxide (MAG-OX) 400 MG tablet Take 400 mg by mouth at bedtime.   Multiple Vitamin (MULTIVITAMIN) tablet Take 1 tablet by mouth daily.   mupirocin ointment (BACTROBAN) 2 % Apply 1 application topically 2 (two) times daily. Apply in the nose two times per day.   omeprazole (PRILOSEC) 20 MG capsule TAKE 1 CAPSULE TWICE DAILY BEFORE MEALS   polyethylene glycol (MIRALAX / GLYCOLAX) 17 g packet Take 17 g by mouth daily.   Saw Palmetto 450 MG CAPS Take 450 mg by mouth daily.    simvastatin (ZOCOR) 40 MG tablet Take 40 mg by mouth every evening.    triamcinolone (KENALOG) 0.1 % Apply 1 application topically 2 (two) times daily.   No facility-administered encounter medications on file as of 01/14/2022.     Lab Results  Component Value Date   WBC 5.9 01/08/2022   HGB 14.2 01/08/2022   HCT 40.9 01/08/2022   PLT 205.0 01/08/2022   GLUCOSE 108 (H) 01/08/2022   CHOL 125 01/08/2022   TRIG 128.0 01/08/2022   HDL 31.60 (L) 01/08/2022   LDLDIRECT 99.1 07/29/2013   LDLCALC 67 01/08/2022   ALT 17 01/08/2022   AST 21 01/08/2022   NA 138 01/08/2022   K 3.9 01/08/2022   CL 104 01/08/2022    CREATININE 1.14 01/08/2022   BUN 15 01/08/2022   CO2 25 01/08/2022   TSH 2.72 09/13/2021   PSA 1.19 09/13/2021   HGBA1C 5.5 01/08/2022    DG Bone Density  Result Date: 11/05/2021 EXAM: DUAL X-RAY ABSORPTIOMETRY (DXA) FOR BONE MINERAL DENSITY IMPRESSION: Your patient Shamell Suarez completed a BMD test on 11/05/2021 using the [facility] (software version: 14.10) manufactured by [employer]. The following summarizes the results of our evaluation. Technologist:VLM PATIENT BIOGRAPHICAL: Name: Berry, Godsey Patient ID: [id number] Birth Date: [birth year] Height: 67.0 in. Gender: Male Exam Date: 11/05/2021 Weight: 168.0 lbs. Indications: Caucasian, Seizures Fractures:  Ribs       Treatments: DENSITOMETRY RESULTS: Site      Region     Measured Date Measured Age WHO Classification Young Adult T-score BMD         %Change vs. Previous Significant Change (*) AP Spine L1-L4 11/05/20[age].4 Osteopenia -2.4 0.939 g/cm2 - - DualFemur Neck Right 11/05/2021 80.4 Osteoporosis -3.2 0.659 g/cm2 - - ASSESSMENT: The BMD measured at Femur Neck Right is 0.659 g/cm2 with a T-score of -3.2. This patient is considered osteoporotic according to [facility]) criteria. The scan quality is good. World Health Organization [facility]) criteria for post-menopausal, Caucasian Women: Normal:                   T-score at or above -1 SD Osteopenia/low bone mass: T-score between -1 and -2.5 SD Osteoporosis:             T-score  at or below -2.5 SD RECOMMENDATIONS: 1. All patients should optimize calcium and vitamin D intake. 2. Consider FDA-approved medical therapies in postmenopausal women and men aged 67 years and older, based on the following: a. A hip or vertebral(clinical or morphometric) fracture b. T-score < -2.5 at the femoral neck or spine after appropriate evaluation to exclude secondary causes c. Low bone mass (T-score between -1.0 and -2.5 at the femoral neck or spine) and a 10-year probability  of a hip fracture > 3% or a 10-year probability of a major osteoporosis-related fracture > 20% based on the [place]-adapted WHO algorithm 3. Clinician judgment and/or patient preferences may indicate treatment for people with 10-year fracture probabilities above or below these levels FOLLOW-UP: People with diagnosed cases of osteoporosis or osteopenia should be regularly tested for bone mineral density. For patients eligible for Medicare, routine testing is allowed once every 2 years. The testing frequency can be increased to one year for patients who have rapidly progressing disease, or for those who are receiving medical therapy to restore bone mass. I have reviewed this report, and agree with the above findings. [facility] Electronically Signed   By: Elmer Picker M.D.   On: 11/05/2021 10:16       Assessment & Plan:   Problem List Items Addressed This Visit     Anxiety    Appears to be doing well.  Off lorazepam.         Aortic atherosclerosis (HCC)    Continue simvastatin.  Low cholesterol diet and exercise.        CAD (coronary artery disease)    Cath as outlined. Saw cardiology 10/2021 - f/u CAD s/p PTCA/PCI.  Taking aspirin daily. Continue risk factor modification.  Currently asymptomatic.         Environmental allergies    Continue xyzal.  astelin and flonase.  Given persistent symptoms, ENT evaluation.        Relevant Orders   Ambulatory referral to ENT   GERD (gastroesophageal reflux disease)    Continue omeprazole.  No upper symptoms reported. No swallowing problems.  Follow.        Hand numbness    Issues with CTS - right.  Discussed further w/up and evaluation.  Wants to hold on any further intervention.  Follow.        Hemorrhoid    Intermittent. Will notify me if desires any further intervention.        History of colonic polyps    Colonoscopy 05/2017 - pathology - moderate active colitis, tubular adenoma. With family history of colon cancer  and previous colonoscopy, refer back to GI for f/u and question of need for f/u colonoscopy.        Relevant Orders   Ambulatory referral to Gastroenterology   Hypercholesterolemia    Continue simvastatin.  Low cholesterol diet and exercise.  Follow lipid panel and liver function tests.        Relevant Orders   Lipid panel   Hepatic function panel   Basic metabolic panel   Hyperglycemia    Low carb diet and exercise.  Follow met b and a1c.        Relevant Orders   Hemoglobin A1c   Osteoporosis    Discussed bone density.  Fosamax.  Weight bearing exercise.  Follow.        Seizure disorder ([place])    Doing well on keppra.  Followed by neurology.        Sleep apnea  Has known sleep apnea.  Not using cpap.  Neurology discussed evaluation by dentist for MAD.         I spent 45 minutes with the patient and more than 50% of the time was spent in consultation regarding the above.  Time spent discussing her current concerns and symptoms.  Time specifically spent discussing his persistent concerns regarding increased nasal congestion, drainage and ENT referral. Also, discussed issues with his hemorrhoid, need for GI f/u and need for f/u colonoscopy.  Time also spent discussing further w/up, evaluation and treatment.   Einar Pheasant, MD

## 2022-01-26 DIAGNOSIS — K649 Unspecified hemorrhoids: Secondary | ICD-10-CM | POA: Insufficient documentation

## 2022-01-26 NOTE — Assessment & Plan Note (Signed)
Intermittent. Will notify me if desires any further intervention.

## 2022-01-26 NOTE — Assessment & Plan Note (Signed)
Cath as outlined. Saw cardiology 10/2021 - f/u CAD s/p PTCA/PCI.  Taking aspirin daily. Continue risk factor modification.  Currently asymptomatic.   

## 2022-01-26 NOTE — Assessment & Plan Note (Signed)
Continue omeprazole.  No upper symptoms reported. No swallowing problems.  Follow.  

## 2022-01-26 NOTE — Assessment & Plan Note (Signed)
Issues with CTS - right.  Discussed further w/up and evaluation.  Wants to hold on any further intervention.  Follow.

## 2022-01-26 NOTE — Assessment & Plan Note (Signed)
Continue simvastatin.  Low cholesterol diet and exercise.  Follow lipid panel and liver function tests.   

## 2022-01-26 NOTE — Assessment & Plan Note (Addendum)
Colonoscopy 05/2017 - pathology - moderate active colitis, tubular adenoma. With family history of colon cancer and previous colonoscopy, refer back to GI for f/u and question of need for f/u colonoscopy.

## 2022-01-26 NOTE — Assessment & Plan Note (Signed)
Discussed bone density.  Fosamax.  Weight bearing exercise.  Follow.

## 2022-01-26 NOTE — Assessment & Plan Note (Signed)
Low carb diet and exercise.  Follow met b and a1c.  

## 2022-01-26 NOTE — Assessment & Plan Note (Signed)
Continue xyzal.  astelin and flonase.  Given persistent symptoms, ENT evaluation.

## 2022-01-26 NOTE — Assessment & Plan Note (Signed)
Has known sleep apnea.  Not using cpap.  Neurology discussed evaluation by dentist for MAD.

## 2022-01-26 NOTE — Assessment & Plan Note (Signed)
Appears to be doing well.  Off lorazepam.

## 2022-01-26 NOTE — Assessment & Plan Note (Signed)
Doing well on keppra.  Followed by neurology.  

## 2022-01-26 NOTE — Assessment & Plan Note (Signed)
Continue simvastatin.  Low cholesterol diet and exercise.  

## 2022-02-19 ENCOUNTER — Ambulatory Visit (INDEPENDENT_AMBULATORY_CARE_PROVIDER_SITE_OTHER): Payer: Medicare HMO

## 2022-02-19 VITALS — Ht 68.0 in | Wt 173.0 lb

## 2022-02-19 DIAGNOSIS — Z0000 Encounter for general adult medical examination without abnormal findings: Secondary | ICD-10-CM

## 2022-02-19 NOTE — Patient Instructions (Addendum)
  [name] , Thank you for taking time to come for your Medicare Wellness Visit. I appreciate your ongoing commitment to your health goals. Please review the following plan we discussed and let me know if I can assist you in the future.   These are the goals we discussed:  Goals      Maintain healthy lifestyle     Stay active Healthy diet        This is a list of the screening recommended for you and due dates:  Health Maintenance  Topic Date Due   COVID-19 Vaccine (3 - Pfizer series) 03/07/2022*   Flu Shot  04/01/2022   Tetanus Vaccine  07/05/2024   Pneumonia Vaccine  Completed   Zoster (Shingles) Vaccine  Completed   HPV Vaccine  Aged Out  *Topic was postponed. The date shown is not the original due date.

## 2022-02-19 NOTE — Progress Notes (Signed)
Subjective:   Edwin [name] is a 81 y.o. male who presents for Medicare Annual/Subsequent preventive examination.  Review of Systems    No ROS.  Medicare Wellness Virtual Visit.  Visual/audio telehealth visit, UTA vital signs.   See social history for additional risk factors.   Cardiac Risk Factors include: advanced age (>2mn, >>66women);male gender;hypertension     Objective:    Today's Vitals   02/19/22 1239  Weight: 173 lb (78.5 kg)  Height: '5\' 8"'$  (1.727 m)   Body mass index is 26.3 kg/m.     02/19/2022   12:40 PM 02/14/2021   10:03 AM 12/31/2020    5:51 AM 02/14/2020   10:53 AM 08/12/2019   12:34 PM 07/19/2019    1:20 AM 07/16/2019   12:22 AM  Advanced Directives  Does Patient Have a Medical Advance Directive? Yes Yes Yes No Yes Yes Yes  Type of [place] will Living will  Healthcare Power of [place] Does patient want to make changes to medical advance directive? No - Patient declined No - Patient declined    No - Patient declined No - Patient declined  Copy of [place] Chart? No - copy requested No - copy requested    No - copy requested Yes - validated most recent copy scanned in chart (See row information)  Would patient like information on creating a medical advance directive?    No - Patient declined       Current Medications (verified) Outpatient Encounter Medications as of 02/19/2022  Medication Sig   alendronate (FOSAMAX) 70 MG tablet Take 1 tablet (70 mg total) by mouth every 7 (seven) days. Take with a full glass of water on an empty stomach.   aspirin 81 MG tablet Take 81 mg by mouth at bedtime.   azelastine (ASTELIN) 0.1 % nasal spray Place 1 spray into both nostrils 2 (two) times daily.   Cholecalciferol (VITAMIN D3) 2000 UNITS TABS Take 2,000 Units by mouth daily.    fluticasone (FLONASE) 50  MCG/ACT nasal spray USE 2 SPRAYS IN EACH NOSTRIL EVERY DAY   hydrocortisone (ANUSOL-HC) 25 MG suppository Place 1 suppository (25 mg total) rectally 2 (two) times daily.   latanoprost (XALATAN) 0.005 % ophthalmic solution Place 1 drop into both eyes at bedtime.   levETIRAcetam (KEPPRA XR) 500 MG 24 hr tablet Take 1,000 mg by mouth daily.   levocetirizine (XYZAL) 5 MG tablet TAKE 1 TABLET EVERY EVENING   magnesium oxide (MAG-OX) 400 MG tablet Take 400 mg by mouth at bedtime.   Multiple Vitamin (MULTIVITAMIN) tablet Take 1 tablet by mouth daily.   mupirocin ointment (BACTROBAN) 2 % Apply 1 application topically 2 (two) times daily. Apply in the nose two times per day.   omeprazole (PRILOSEC) 20 MG capsule TAKE 1 CAPSULE TWICE DAILY BEFORE MEALS   polyethylene glycol (MIRALAX / GLYCOLAX) 17 g packet Take 17 g by mouth daily.   Saw Palmetto 450 MG CAPS Take 450 mg by mouth daily.    simvastatin (ZOCOR) 40 MG tablet Take 40 mg by mouth every evening.    triamcinolone (KENALOG) 0.1 % Apply 1 application topically 2 (two) times daily.   No facility-administered encounter medications on file as of 02/19/2022.    Allergies (verified) Penicillins and Sulfa antibiotics   History: Past Medical History:  Diagnosis Date   Allergy    Arthritis  Coronary artery disease    Diverticulosis of colon    GERD (gastroesophageal reflux disease)    ESOPHAGEAL REFLUX   Glaucoma    Gout    Hemorrhoids    History of kidney stones    Hx of colonic polyp    Hypercholesterolemia    Hypertension    Schatzki's ring    Seizures ([place])    Past Surgical History:  Procedure Laterality Date   CARDIAC CATHETERIZATION     COLONOSCOPY WITH PROPOFOL N/A 05/18/2017   Procedure: COLONOSCOPY WITH PROPOFOL;  Surgeon: Manya Silvas, MD;  Location: [facility] ENDOSCOPY;  Service: Endoscopy;  Laterality: N/A;   CORONARY ANGIOPLASTY     EYE SURGERY  07/20/13   cataract   HERNIA REPAIR Right 2008 & 2012   VASECTOMY  1984    Family History  Problem Relation Age of Onset   Arthritis Mother    Heart disease Mother    Stroke Mother    Hypertension Mother    Arthritis Father    Hyperlipidemia Father    Heart disease Father    Hypertension Father    Parkinson's disease Father    Multiple sclerosis Sister    Colon cancer Maternal Grandmother    Arthritis Paternal Grandmother    Colon cancer Paternal Grandmother    Social History   Socioeconomic History   Marital status: Married    Spouse name: Not on file   Number of children: Not on file   Years of education: Not on file   Highest education level: Not on file  Occupational History   Not on file  Tobacco Use   Smoking status: Never   Smokeless tobacco: Never  Vaping Use   Vaping Use: Never used  Substance and Sexual Activity   Alcohol use: No    Alcohol/week: 0.0 standard drinks of alcohol   Drug use: No   Sexual activity: Never  Other Topics Concern   Not on file  Social History Narrative   Not on file   Social Determinants of Health   Financial Resource Strain: Low Risk  (02/19/2022)   Overall Financial Resource Strain (CARDIA)    Difficulty of Paying Living Expenses: Not hard at all  Food Insecurity: No Food Insecurity (02/19/2022)   Hunger Vital Sign    Worried About Running Out of Food in the Last Year: Never true    Ran Out of Food in the Last Year: Never true  Transportation Needs: No Transportation Needs (02/19/2022)   PRAPARE - Hydrologist (Medical): No    Lack of Transportation (Non-Medical): No  Physical Activity: Not on file  Stress: No Stress Concern Present (02/19/2022)   [place]    Feeling of Stress : Not at all  Social Connections: Unknown (02/19/2022)   Social Connection and Isolation Panel [NHANES]    Frequency of Communication with Friends and Family: Not on file    Frequency of Social Gatherings with Friends and  Family: Not on file    Attends Religious Services: Not on file    Active Member of Clubs or Organizations: Not on file    Attends Archivist Meetings: Not on file    Marital Status: Married    Tobacco Counseling Counseling given: Not Answered   Clinical Intake:  Pre-visit preparation completed: Yes        Diabetes: No  How often do you need to have someone help you when you read  instructions, pamphlets, or other written materials from your doctor or pharmacy?: 1 - Never  Interpreter Needed?: No      Activities of Daily Living    02/19/2022   12:51 PM  In your present state of health, do you have any difficulty performing the following activities:  Hearing? 1  Vision? 0  Difficulty concentrating or making decisions? 0  Walking or climbing stairs? 0  Dressing or bathing? 0  Doing errands, shopping? 0  Preparing Food and eating ? N  Using the Toilet? N  In the past six months, have you accidently leaked urine? N  Do you have problems with loss of bowel control? N  Managing your Medications? N  Managing your Finances? N  Housekeeping or managing your Housekeeping? N    Patient Care Team: Einar Pheasant, MD as PCP - General (Internal Medicine)  Indicate any recent Medical Services you may have received from other than [facility] providers in the past year (date may be approximate).     Assessment:   This is a routine wellness examination for Edwin [name].  Virtual Visit via Telephone Note  I connected with  Edwin [name] on 02/19/22 at  1:00 PM EDT by telephone and verified that I am speaking with the correct person using two identifiers.  Persons participating in the virtual visit: patient/Nurse Health Advisor   I discussed the limitations of performing an evaluation and management service by telehealth. We continued and completed visit with audio only. Some vital signs may be absent or patient reported.   Hearing/Vision screen Hearing Screening -  Comments:: Patient has difficulty hearing conversational tones L ear. He does not wear hearing aids. He does have a headset amplifier. Followed by [facility]NT. Vision Screening - Comments:: Followed by [facility]  Wears corrective lenses  Visits every 6 months Glaucoma; drops in use  Cataract extraction, bilateral They have regular follow up with the ophthalmologist  Dietary issues and exercise activities discussed: Current Exercise Habits: Home exercise routine, Intensity: Mild Healthy diet  Good water intake   Goals Addressed             This Visit's Progress    Maintain healthy lifestyle       Stay active Healthy diet       Depression Screen    02/19/2022   12:45 PM 01/14/2022    9:58 AM 09/16/2021    9:10 AM 02/14/2021   10:01 AM 09/12/2020   10:16 AM 02/14/2020   10:48 AM 08/10/2019    9:46 AM  PHQ 2/9 Scores  PHQ - 2 Score 0 0 0 0 0 0 3  PHQ- 9 Score       12    Fall Risk    02/19/2022   12:51 PM 01/14/2022    9:58 AM 09/16/2021    9:10 AM 02/14/2021   10:28 AM 01/10/2021   10:42 AM  Fall Risk   Falls in the past year? 0 0 0 0 0  Number falls in past yr:   0    Injury with Fall?   0    Risk for fall due to :  No Fall Risks     Follow up Falls evaluation completed Falls evaluation completed Falls evaluation completed      [place]: Home free of loose throw rugs in walkways, pet beds, electrical cords, etc? Yes  Adequate lighting in your home to reduce risk of falls? Yes   ASSISTIVE DEVICES  UTILIZED TO PREVENT FALLS: Life alert? No  Use of a cane, walker or w/c? No   TIMED UP AND GO: Was the test performed? No .   Cognitive Function: Patient is alert and oriented x3.      10/14/2017   10:51 AM 10/12/2015    9:52 AM  MMSE - Mini Mental State Exam  Orientation to time 5 5  Orientation to Place 5 5  Registration 3 3  Attention/ Calculation 5 5  Recall 2 3  Language- name 2 objects 2 2  Language- repeat 1 1   Language- follow 3 step command 3 3  Language- read & follow direction 1 1  Write a sentence 1 1  Copy design 1 1  Total score 29 30        02/14/2020   10:56 AM 10/13/2016   10:47 AM  6CIT Screen  What Year?  0 points  What month?  0 points  What time?  0 points  Count back from 20  0 points  Months in reverse 0 points 0 points  Repeat phrase  0 points  Total Score  0 points    Immunizations Immunization History  Administered Date(s) Administered   Fluad Quad(high Dose 65+) 06/29/2019, 06/04/2020, 05/15/2021   Influenza, High Dose Seasonal PF 06/12/2016, 06/17/2017, 06/23/2018   Influenza,inj,Quad PF,6+ Mos 07/29/2013, 06/20/2014, 07/18/2015   PFIZER(Purple Top)SARS-COV-2 Vaccination 09/19/2019, 10/10/2019   Pneumococcal Conjugate-13 08/09/2013   Pneumococcal Polysaccharide-23 07/18/2015   Tdap 09/01/2004, 07/05/2014   Zoster Recombinat (Shingrix) 06/14/2019, 12/09/2019   Zoster, Live 08/29/2013   Screening Tests Health Maintenance  Topic Date Due   COVID-19 Vaccine (3 - Pfizer series) 03/07/2022 (Originally 12/05/2019)   INFLUENZA VACCINE  04/01/2022   TETANUS/TDAP  07/05/2024   Pneumonia Vaccine 17+ Years old  Completed   Zoster Vaccines- Shingrix  Completed   HPV VACCINES  Aged Out   Health Maintenance There are no preventive care reminders to display for this patient.  Lung Cancer Screening: (Low Dose CT Chest recommended if Age 70-80 years, 30 pack-year currently smoking OR have quit w/in 15years.) does not qualify.   Hepatitis C Screening: does not qualify  Vision Screening: Recommended annual ophthalmology exams for early detection of glaucoma and other disorders of the eye.  Dental Screening: Recommended annual dental exams for proper oral hygiene  Community Resource Referral / Chronic Care Management: CRR required this visit?  No   CCM required this visit?  No      Plan:   Keep all routine maintenance appointments.   I have personally reviewed  and noted the following in the patient's chart:   Medical and social history Use of alcohol, tobacco or illicit drugs  Current medications and supplements including opioid prescriptions. Patient is not currently taking opioid prescriptions. Functional ability and status Nutritional status Physical activity Advanced directives List of other physicians Hospitalizations, surgeries, and ER visits in previous 12 months Vitals Screenings to include cognitive, depression, and falls Referrals and appointments  In addition, I have reviewed and discussed with patient certain preventive protocols, quality metrics, and best practice recommendations. A written personalized care plan for preventive services as well as general preventive health recommendations were provided to patient.     Varney Biles, LPN   3/33/5456

## 2022-02-25 DIAGNOSIS — G3184 Mild cognitive impairment, so stated: Secondary | ICD-10-CM | POA: Diagnosis not present

## 2022-02-25 DIAGNOSIS — R030 Elevated blood-pressure reading, without diagnosis of hypertension: Secondary | ICD-10-CM | POA: Diagnosis not present

## 2022-02-25 DIAGNOSIS — G4733 Obstructive sleep apnea (adult) (pediatric): Secondary | ICD-10-CM | POA: Diagnosis not present

## 2022-02-25 DIAGNOSIS — R569 Unspecified convulsions: Secondary | ICD-10-CM | POA: Diagnosis not present

## 2022-02-26 ENCOUNTER — Other Ambulatory Visit: Payer: Self-pay | Admitting: Internal Medicine

## 2022-05-12 DIAGNOSIS — I700 Atherosclerosis of aorta: Secondary | ICD-10-CM | POA: Diagnosis not present

## 2022-05-12 DIAGNOSIS — I10 Essential (primary) hypertension: Secondary | ICD-10-CM | POA: Diagnosis not present

## 2022-05-12 DIAGNOSIS — I2510 Atherosclerotic heart disease of native coronary artery without angina pectoris: Secondary | ICD-10-CM | POA: Diagnosis not present

## 2022-05-12 DIAGNOSIS — E7800 Pure hypercholesterolemia, unspecified: Secondary | ICD-10-CM | POA: Diagnosis not present

## 2022-05-12 DIAGNOSIS — G4733 Obstructive sleep apnea (adult) (pediatric): Secondary | ICD-10-CM | POA: Diagnosis not present

## 2022-05-20 ENCOUNTER — Other Ambulatory Visit (INDEPENDENT_AMBULATORY_CARE_PROVIDER_SITE_OTHER): Payer: Medicare HMO

## 2022-05-20 DIAGNOSIS — R739 Hyperglycemia, unspecified: Secondary | ICD-10-CM

## 2022-05-20 DIAGNOSIS — E7800 Pure hypercholesterolemia, unspecified: Secondary | ICD-10-CM

## 2022-05-20 LAB — LIPID PANEL
Cholesterol: 122 mg/dL (ref 0–200)
HDL: 33.1 mg/dL — ABNORMAL LOW (ref >39.00)
LDL Cholesterol: 67 mg/dL (ref 0–99)
NonHDL: 88.55
Total CHOL/HDL Ratio: 4
Triglycerides: 106 mg/dL (ref 0.0–149.0)
VLDL: 21.2 mg/dL (ref 0.0–40.0)

## 2022-05-20 LAB — HEMOGLOBIN A1C: Hgb A1c MFr Bld: 5.7 % (ref 4.6–6.5)

## 2022-05-20 LAB — HEPATIC FUNCTION PANEL
ALT: 16 U/L (ref 0–53)
AST: 19 U/L (ref 0–37)
Albumin: 3.9 g/dL (ref 3.5–5.2)
Alkaline Phosphatase: 53 U/L (ref 39–117)
Bilirubin, Direct: 0.2 mg/dL (ref 0.0–0.3)
Total Bilirubin: 0.9 mg/dL (ref 0.2–1.2)
Total Protein: 6.7 g/dL (ref 6.0–8.3)

## 2022-05-20 LAB — BASIC METABOLIC PANEL
BUN: 17 mg/dL (ref 6–23)
CO2: 24 mEq/L (ref 19–32)
Calcium: 9.1 mg/dL (ref 8.4–10.5)
Chloride: 105 mEq/L (ref 96–112)
Creatinine, Ser: 1.09 mg/dL (ref 0.40–1.50)
GFR: 63.91 mL/min (ref >60.00)
Glucose, Bld: 100 mg/dL — ABNORMAL HIGH (ref 70–99)
Potassium: 3.9 mEq/L (ref 3.5–5.1)
Sodium: 138 mEq/L (ref 135–145)

## 2022-05-23 ENCOUNTER — Ambulatory Visit (INDEPENDENT_AMBULATORY_CARE_PROVIDER_SITE_OTHER): Payer: Medicare HMO | Admitting: Internal Medicine

## 2022-05-23 ENCOUNTER — Encounter: Payer: Self-pay | Admitting: Internal Medicine

## 2022-05-23 VITALS — BP 130/70 | HR 70 | Temp 98.5°F | Ht 68.0 in | Wt 171.4 lb

## 2022-05-23 DIAGNOSIS — Z0000 Encounter for general adult medical examination without abnormal findings: Secondary | ICD-10-CM | POA: Diagnosis not present

## 2022-05-23 DIAGNOSIS — Z23 Encounter for immunization: Secondary | ICD-10-CM | POA: Diagnosis not present

## 2022-05-23 DIAGNOSIS — I2510 Atherosclerotic heart disease of native coronary artery without angina pectoris: Secondary | ICD-10-CM

## 2022-05-23 DIAGNOSIS — E7800 Pure hypercholesterolemia, unspecified: Secondary | ICD-10-CM

## 2022-05-23 DIAGNOSIS — G40909 Epilepsy, unspecified, not intractable, without status epilepticus: Secondary | ICD-10-CM

## 2022-05-23 DIAGNOSIS — I700 Atherosclerosis of aorta: Secondary | ICD-10-CM

## 2022-05-23 DIAGNOSIS — R739 Hyperglycemia, unspecified: Secondary | ICD-10-CM | POA: Diagnosis not present

## 2022-05-23 DIAGNOSIS — K219 Gastro-esophageal reflux disease without esophagitis: Secondary | ICD-10-CM

## 2022-05-23 DIAGNOSIS — I10 Essential (primary) hypertension: Secondary | ICD-10-CM | POA: Diagnosis not present

## 2022-05-23 NOTE — Assessment & Plan Note (Signed)
Physical today 05/23/22.  Colonoscopy 05/2017 .  psa 09/13/21 - 1.19.

## 2022-05-23 NOTE — Progress Notes (Signed)
Patient ID: Edwin [name], male   DOB: Aug 31, 1941, 81 y.o.   MRN: [id number]   Subjective:    Patient ID: Edwin [name], male    DOB: 1941/08/29, 81 y.o.   MRN: [id number]   Patient here for  Chief Complaint  Patient presents with   Follow-up    Physical   .   HPI Recently saw cardiology.  Recommended continue aspirin and simvastatin.  Stable.  With sleep apnea.  Trying to get used to wearing cpap.  History of seizures.  Stable on keppra.  No chest pain or sob reported.  No abdominal pain.  Bowels moving.  Blood pressure 128-130/62.    Past Medical History:  Diagnosis Date   Allergy    Arthritis    Coronary artery disease    Diverticulosis of colon    GERD (gastroesophageal reflux disease)    ESOPHAGEAL REFLUX   Glaucoma    Gout    Hemorrhoids    History of kidney stones    Hx of colonic polyp    Hypercholesterolemia    Hypertension    Schatzki's ring    Seizures ([place])    Past Surgical History:  Procedure Laterality Date   CARDIAC CATHETERIZATION     COLONOSCOPY WITH PROPOFOL N/A 05/18/2017   Procedure: COLONOSCOPY WITH PROPOFOL;  Surgeon: Manya Silvas, MD;  Location: [facility] ENDOSCOPY;  Service: Endoscopy;  Laterality: N/A;   CORONARY ANGIOPLASTY     EYE SURGERY  07/20/13   cataract   HERNIA REPAIR Right 2008 & 2012   VASECTOMY  1984   Family History  Problem Relation Age of Onset   Arthritis Mother    Heart disease Mother    Stroke Mother    Hypertension Mother    Arthritis Father    Hyperlipidemia Father    Heart disease Father    Hypertension Father    Parkinson's disease Father    Multiple sclerosis Sister    Colon cancer Maternal Grandmother    Arthritis Paternal Grandmother    Colon cancer Paternal Grandmother    Social History   Socioeconomic History   Marital status: Married    Spouse name: Not on file   Number of children: Not on file   Years of education: Not on file   Highest education level: Not on file  Occupational History    Not on file  Tobacco Use   Smoking status: Never   Smokeless tobacco: Never  Vaping Use   Vaping Use: Never used  Substance and Sexual Activity   Alcohol use: No    Alcohol/week: 0.0 standard drinks of alcohol   Drug use: No   Sexual activity: Never  Other Topics Concern   Not on file  Social History Narrative   Not on file   Social Determinants of Health   Financial Resource Strain: Low Risk  (02/19/2022)   Overall Financial Resource Strain (CARDIA)    Difficulty of Paying Living Expenses: Not hard at all  Food Insecurity: No Food Insecurity (02/19/2022)   Hunger Vital Sign    Worried About Running Out of Food in the Last Year: Never true    Ran Out of Food in the Last Year: Never true  Transportation Needs: No Transportation Needs (02/19/2022)   PRAPARE - Hydrologist (Medical): No    Lack of Transportation (Non-Medical): No  Physical Activity: Not on file  Stress: No Stress Concern Present (02/19/2022)   [place]  Stress Questionnaire    Feeling of Stress : Not at all  Social Connections: Unknown (02/19/2022)   Social Connection and Isolation Panel [NHANES]    Frequency of Communication with Friends and Family: Not on file    Frequency of Social Gatherings with Friends and Family: Not on file    Attends Religious Services: Not on file    Active Member of Clubs or Organizations: Not on file    Attends Archivist Meetings: Not on file    Marital Status: Married     Review of Systems  Constitutional:  Negative for appetite change and unexpected weight change.  HENT:  Negative for congestion, sinus pressure and sore throat.   Eyes:  Negative for pain and visual disturbance.  Respiratory:  Negative for cough, chest tightness and shortness of breath.   Cardiovascular:  Negative for chest pain, palpitations and leg swelling.  Gastrointestinal:  Negative for abdominal pain, diarrhea, nausea  and vomiting.  Genitourinary:  Negative for difficulty urinating and dysuria.  Musculoskeletal:  Negative for joint swelling and myalgias.  Skin:  Negative for color change and rash.  Neurological:  Negative for dizziness, light-headedness and headaches.  Hematological:  Negative for adenopathy. Does not bruise/bleed easily.  Psychiatric/Behavioral:  Negative for agitation and dysphoric mood.        Objective:     BP 130/70 (BP Location: Left Arm, Patient Position: Sitting, Cuff Size: Normal)   Pulse 70   Temp 98.5 F (36.9 C) (Oral)   Ht 5' 8"  (1.727 m)   Wt 171 lb 6.4 oz (77.7 kg)   SpO2 95%   BMI 26.06 kg/m  Wt Readings from Last 3 Encounters:  05/23/22 171 lb 6.4 oz (77.7 kg)  02/19/22 173 lb (78.5 kg)  01/14/22 173 lb 9.6 oz (78.7 kg)    Physical Exam Constitutional:      General: He is not in acute distress.    Appearance: Normal appearance. He is well-developed.  HENT:     Head: Normocephalic and atraumatic.     Right Ear: External ear normal.     Left Ear: External ear normal.  Eyes:     General: No scleral icterus.       Right eye: No discharge.        Left eye: No discharge.     Conjunctiva/sclera: Conjunctivae normal.  Neck:     Thyroid: No thyromegaly.  Cardiovascular:     Rate and Rhythm: Normal rate and regular rhythm.  Pulmonary:     Effort: No respiratory distress.     Breath sounds: Normal breath sounds. No wheezing.  Abdominal:     General: Bowel sounds are normal.     Palpations: Abdomen is soft.     Tenderness: There is no abdominal tenderness.  Musculoskeletal:        General: No swelling or tenderness.     Cervical back: Neck supple. No tenderness.  Lymphadenopathy:     Cervical: No cervical adenopathy.  Skin:    Findings: No erythema or rash.  Neurological:     Mental Status: He is alert and oriented to person, place, and time.  Psychiatric:        Mood and Affect: Mood normal.        Behavior: Behavior normal.      Outpatient  Encounter Medications as of 05/23/2022  Medication Sig   alendronate (FOSAMAX) 70 MG tablet Take 1 tablet (70 mg total) by mouth every 7 (seven) days. Take with a full  glass of water on an empty stomach.   aspirin 81 MG tablet Take 81 mg by mouth at bedtime.   azelastine (ASTELIN) 0.1 % nasal spray Place 1 spray into both nostrils 2 (two) times daily.   Cholecalciferol (VITAMIN D3) 2000 UNITS TABS Take 2,000 Units by mouth daily.    fluticasone (FLONASE) 50 MCG/ACT nasal spray USE 2 SPRAYS IN EACH NOSTRIL EVERY DAY   hydrocortisone (ANUSOL-HC) 25 MG suppository Place 1 suppository (25 mg total) rectally 2 (two) times daily.   latanoprost (XALATAN) 0.005 % ophthalmic solution Place 1 drop into both eyes at bedtime.   levocetirizine (XYZAL) 5 MG tablet TAKE 1 TABLET EVERY EVENING   magnesium oxide (MAG-OX) 400 MG tablet Take 400 mg by mouth at bedtime.   Multiple Vitamin (MULTIVITAMIN) tablet Take 1 tablet by mouth daily.   mupirocin ointment (BACTROBAN) 2 % Apply 1 application topically 2 (two) times daily. Apply in the nose two times per day.   omeprazole (PRILOSEC) 20 MG capsule TAKE 1 CAPSULE TWICE DAILY BEFORE MEALS   polyethylene glycol (MIRALAX / GLYCOLAX) 17 g packet Take 17 g by mouth daily.   Saw Palmetto 450 MG CAPS Take 450 mg by mouth daily.    simvastatin (ZOCOR) 40 MG tablet Take 40 mg by mouth every evening.    triamcinolone (KENALOG) 0.1 % Apply 1 application topically 2 (two) times daily.   levETIRAcetam (KEPPRA XR) 500 MG 24 hr tablet Take 1,000 mg by mouth daily.   No facility-administered encounter medications on file as of 05/23/2022.     Lab Results  Component Value Date   WBC 5.9 01/08/2022   HGB 14.2 01/08/2022   HCT 40.9 01/08/2022   PLT 205.0 01/08/2022   GLUCOSE 100 (H) 05/20/2022   CHOL 122 05/20/2022   TRIG 106.0 05/20/2022   HDL 33.10 (L) 05/20/2022   LDLDIRECT 99.1 07/29/2013   LDLCALC 67 05/20/2022   ALT 16 05/20/2022   AST 19 05/20/2022   NA 138  05/20/2022   K 3.9 05/20/2022   CL 105 05/20/2022   CREATININE 1.09 05/20/2022   BUN 17 05/20/2022   CO2 24 05/20/2022   TSH 2.72 09/13/2021   PSA 1.19 09/13/2021   HGBA1C 5.7 05/20/2022    DG Bone Density  Result Date: 11/05/2021 EXAM: DUAL X-RAY ABSORPTIOMETRY (DXA) FOR BONE MINERAL DENSITY IMPRESSION: Your patient Herndon Grill completed a BMD test on 11/05/2021 using the [employer] (software version: 14.10) manufactured by [employer]. The following summarizes the results of our evaluation. Technologist:VLM PATIENT BIOGRAPHICAL: Name: Muriel, Wilber Patient ID: [id number] Birth Date: [birth year] Height: 67.0 in. Gender: Male Exam Date: 11/05/2021 Weight: 168.0 lbs. Indications: Caucasian, Seizures Fractures:  Ribs       Treatments: DENSITOMETRY RESULTS: Site      Region     Measured Date Measured Age WHO Classification Young Adult T-score BMD         %Change vs. Previous Significant Change (*) AP Spine L1-L4 11/05/2021 80.4 Osteopenia -2.4 0.939 g/cm2 - - DualFemur Neck Right 11/05/2021 80.4 Osteoporosis -3.2 0.659 g/cm2 - - ASSESSMENT: The BMD measured at Femur Neck Right is 0.659 g/cm2 with a T-score of -3.2. This patient is considered osteoporotic according to [facility]) criteria. The scan quality is good. World Health Organization [facility]) criteria for post-menopausal, Caucasian Women: Normal:                   T-score at or above -1 SD Osteopenia/low  bone mass: T-score between -1 and -2.5 SD Osteoporosis:             T-score at or below -2.5 SD RECOMMENDATIONS: 1. All patients should optimize calcium and vitamin D intake. 2. Consider FDA-approved medical therapies in postmenopausal women and men aged 48 years and older, based on the following: a. A hip or vertebral(clinical or morphometric) fracture b. T-score < -2.5 at the femoral neck or spine after appropriate evaluation to exclude secondary causes c. Low bone mass (T-score between -1.0 and -2.5  at the femoral neck or spine) and a 10-year probability of a hip fracture > 3% or a 10-year probability of a major osteoporosis-related fracture > 20% based on the [place]-adapted WHO algorithm 3. Clinician judgment and/or patient preferences may indicate treatment for people with 10-year fracture probabilities above or below these levels FOLLOW-UP: People with diagnosed cases of osteoporosis or osteopenia should be regularly tested for bone mineral density. For patients eligible for Medicare, routine testing is allowed once every 2 years. The testing frequency can be increased to one year for patients who have rapidly progressing disease, or for those who are receiving medical therapy to restore bone mass. I have reviewed this report, and agree with the above findings. [facility] Electronically Signed   By: Elmer Picker M.D.   On: 11/05/2021 10:16       Assessment & Plan:   Problem List Items Addressed This Visit     Aortic atherosclerosis ([place])    Continue simvastatin.  Low cholesterol diet and exercise.       CAD (coronary artery disease)    Cath as outlined. Saw cardiology 10/2021 - f/u CAD s/p PTCA/PCI.  Taking aspirin daily. Continue risk factor modification.  Currently asymptomatic.        Essential hypertension, benign    Blood pressure as outlined.  On no medication.  Follow pressures.  Follow metabolic panel.       Relevant Orders   Basic metabolic panel   GERD (gastroesophageal reflux disease)    Continue omeprazole.  No upper symptoms reported. No swallowing problems.  Follow.       Health care maintenance    Physical today 05/23/22.  Colonoscopy 05/2017 .  psa 09/13/21 - 1.19.        Hypercholesterolemia    Continue simvastatin.  Low cholesterol diet and exercise.  Follow lipid panel and liver function tests.       Relevant Orders   Hepatic function panel   Lipid panel   Hyperglycemia    Low carb diet and exercise.  Follow met b and a1c.        Relevant Orders   Hemoglobin A1c   Seizure disorder ([place])    Doing well on keppra.  Followed by neurology.       Other Visit Diagnoses     Routine general medical examination at a health care facility    -  Primary   Need for immunization against influenza       Relevant Orders   Flu Vaccine QUAD High Dose(Fluad) (Completed)        Einar Pheasant, MD

## 2022-05-31 ENCOUNTER — Encounter: Payer: Self-pay | Admitting: Internal Medicine

## 2022-05-31 NOTE — Assessment & Plan Note (Signed)
Doing well on keppra.  Followed by neurology.  

## 2022-05-31 NOTE — Assessment & Plan Note (Signed)
Cath as outlined. Saw cardiology 10/2021 - f/u CAD s/p PTCA/PCI.  Taking aspirin daily. Continue risk factor modification.  Currently asymptomatic.

## 2022-05-31 NOTE — Assessment & Plan Note (Signed)
Continue omeprazole.  No upper symptoms reported. No swallowing problems.  Follow.  

## 2022-05-31 NOTE — Assessment & Plan Note (Signed)
Continue simvastatin.  Low cholesterol diet and exercise.  

## 2022-05-31 NOTE — Assessment & Plan Note (Signed)
Continue simvastatin.  Low cholesterol diet and exercise.  Follow lipid panel and liver function tests.   

## 2022-05-31 NOTE — Assessment & Plan Note (Signed)
Blood pressure as outlined.  On no medication.  Follow pressures.  Follow metabolic panel.  

## 2022-05-31 NOTE — Assessment & Plan Note (Signed)
Low carb diet and exercise.  Follow met b and a1c.  

## 2022-06-10 DIAGNOSIS — G4733 Obstructive sleep apnea (adult) (pediatric): Secondary | ICD-10-CM | POA: Diagnosis not present

## 2022-06-10 DIAGNOSIS — R030 Elevated blood-pressure reading, without diagnosis of hypertension: Secondary | ICD-10-CM | POA: Diagnosis not present

## 2022-06-10 DIAGNOSIS — R569 Unspecified convulsions: Secondary | ICD-10-CM | POA: Diagnosis not present

## 2022-06-10 DIAGNOSIS — G3184 Mild cognitive impairment, so stated: Secondary | ICD-10-CM | POA: Diagnosis not present

## 2022-06-10 DIAGNOSIS — G939 Disorder of brain, unspecified: Secondary | ICD-10-CM | POA: Diagnosis not present

## 2022-06-23 DIAGNOSIS — Z0100 Encounter for examination of eyes and vision without abnormal findings: Secondary | ICD-10-CM | POA: Diagnosis not present

## 2022-06-23 DIAGNOSIS — H40003 Preglaucoma, unspecified, bilateral: Secondary | ICD-10-CM | POA: Diagnosis not present

## 2022-06-25 ENCOUNTER — Other Ambulatory Visit: Payer: Self-pay | Admitting: Internal Medicine

## 2022-06-26 DIAGNOSIS — H6063 Unspecified chronic otitis externa, bilateral: Secondary | ICD-10-CM | POA: Diagnosis not present

## 2022-06-26 DIAGNOSIS — J343 Hypertrophy of nasal turbinates: Secondary | ICD-10-CM | POA: Diagnosis not present

## 2022-06-26 DIAGNOSIS — H6123 Impacted cerumen, bilateral: Secondary | ICD-10-CM | POA: Diagnosis not present

## 2022-07-10 DIAGNOSIS — Z83719 Family history of colon polyps, unspecified: Secondary | ICD-10-CM | POA: Diagnosis not present

## 2022-07-10 DIAGNOSIS — K5901 Slow transit constipation: Secondary | ICD-10-CM | POA: Diagnosis not present

## 2022-07-10 DIAGNOSIS — Z86010 Personal history of colonic polyps: Secondary | ICD-10-CM | POA: Diagnosis not present

## 2022-07-10 DIAGNOSIS — Z800 Family history of malignant neoplasm of digestive organs: Secondary | ICD-10-CM | POA: Diagnosis not present

## 2022-09-12 DIAGNOSIS — Z859 Personal history of malignant neoplasm, unspecified: Secondary | ICD-10-CM | POA: Diagnosis not present

## 2022-09-12 DIAGNOSIS — L821 Other seborrheic keratosis: Secondary | ICD-10-CM | POA: Diagnosis not present

## 2022-09-12 DIAGNOSIS — L578 Other skin changes due to chronic exposure to nonionizing radiation: Secondary | ICD-10-CM | POA: Diagnosis not present

## 2022-09-12 DIAGNOSIS — L570 Actinic keratosis: Secondary | ICD-10-CM | POA: Diagnosis not present

## 2022-09-12 DIAGNOSIS — D225 Melanocytic nevi of trunk: Secondary | ICD-10-CM | POA: Diagnosis not present

## 2022-09-17 ENCOUNTER — Other Ambulatory Visit: Payer: Self-pay | Admitting: Internal Medicine

## 2022-09-19 ENCOUNTER — Other Ambulatory Visit (INDEPENDENT_AMBULATORY_CARE_PROVIDER_SITE_OTHER): Payer: Medicare HMO

## 2022-09-19 DIAGNOSIS — I10 Essential (primary) hypertension: Secondary | ICD-10-CM

## 2022-09-19 DIAGNOSIS — E7800 Pure hypercholesterolemia, unspecified: Secondary | ICD-10-CM

## 2022-09-19 DIAGNOSIS — R739 Hyperglycemia, unspecified: Secondary | ICD-10-CM | POA: Diagnosis not present

## 2022-09-19 LAB — HEPATIC FUNCTION PANEL
ALT: 21 U/L (ref 0–53)
AST: 25 U/L (ref 0–37)
Albumin: 4.2 g/dL (ref 3.5–5.2)
Alkaline Phosphatase: 56 U/L (ref 39–117)
Bilirubin, Direct: 0.2 mg/dL (ref 0.0–0.3)
Total Bilirubin: 1 mg/dL (ref 0.2–1.2)
Total Protein: 7.1 g/dL (ref 6.0–8.3)

## 2022-09-19 LAB — BASIC METABOLIC PANEL
BUN: 15 mg/dL (ref 6–23)
CO2: 28 mEq/L (ref 19–32)
Calcium: 9.2 mg/dL (ref 8.4–10.5)
Chloride: 104 mEq/L (ref 96–112)
Creatinine, Ser: 1.16 mg/dL (ref 0.40–1.50)
GFR: 59.17 mL/min — ABNORMAL LOW (ref >60.00)
Glucose, Bld: 99 mg/dL (ref 70–99)
Potassium: 4.2 mEq/L (ref 3.5–5.1)
Sodium: 141 mEq/L (ref 135–145)

## 2022-09-19 LAB — LIPID PANEL
Cholesterol: 142 mg/dL (ref 0–200)
HDL: 35.2 mg/dL — ABNORMAL LOW (ref >39.00)
LDL Cholesterol: 78 mg/dL (ref 0–99)
NonHDL: 106.76
Total CHOL/HDL Ratio: 4
Triglycerides: 143 mg/dL (ref 0.0–149.0)
VLDL: 28.6 mg/dL (ref 0.0–40.0)

## 2022-09-19 LAB — HEMOGLOBIN A1C: Hgb A1c MFr Bld: 5.7 % (ref 4.6–6.5)

## 2022-09-24 ENCOUNTER — Ambulatory Visit (INDEPENDENT_AMBULATORY_CARE_PROVIDER_SITE_OTHER): Payer: Medicare HMO | Admitting: Internal Medicine

## 2022-09-24 ENCOUNTER — Encounter: Payer: Self-pay | Admitting: Internal Medicine

## 2022-09-24 VITALS — BP 112/70 | HR 88 | Temp 97.9°F | Resp 16 | Ht 68.0 in | Wt 173.6 lb

## 2022-09-24 DIAGNOSIS — I700 Atherosclerosis of aorta: Secondary | ICD-10-CM

## 2022-09-24 DIAGNOSIS — Z125 Encounter for screening for malignant neoplasm of prostate: Secondary | ICD-10-CM

## 2022-09-24 DIAGNOSIS — K219 Gastro-esophageal reflux disease without esophagitis: Secondary | ICD-10-CM

## 2022-09-24 DIAGNOSIS — F419 Anxiety disorder, unspecified: Secondary | ICD-10-CM | POA: Diagnosis not present

## 2022-09-24 DIAGNOSIS — R739 Hyperglycemia, unspecified: Secondary | ICD-10-CM | POA: Diagnosis not present

## 2022-09-24 DIAGNOSIS — I10 Essential (primary) hypertension: Secondary | ICD-10-CM | POA: Diagnosis not present

## 2022-09-24 DIAGNOSIS — M810 Age-related osteoporosis without current pathological fracture: Secondary | ICD-10-CM

## 2022-09-24 DIAGNOSIS — Z9109 Other allergy status, other than to drugs and biological substances: Secondary | ICD-10-CM | POA: Diagnosis not present

## 2022-09-24 DIAGNOSIS — I2510 Atherosclerotic heart disease of native coronary artery without angina pectoris: Secondary | ICD-10-CM | POA: Diagnosis not present

## 2022-09-24 DIAGNOSIS — Z86010 Personal history of colonic polyps: Secondary | ICD-10-CM

## 2022-09-24 DIAGNOSIS — E7800 Pure hypercholesterolemia, unspecified: Secondary | ICD-10-CM

## 2022-09-24 DIAGNOSIS — G40909 Epilepsy, unspecified, not intractable, without status epilepticus: Secondary | ICD-10-CM

## 2022-09-24 NOTE — Progress Notes (Signed)
Subjective:    Patient ID: Edwin [name], male    DOB: 1940-10-29, 82 y.o.   MRN: [id number]  Patient here for  Chief Complaint  Patient presents with   Medical Management of Chronic Issues    HPI Here to follow up regarding his blood pressure, cholesterol and GERD.  Followed by neurology - history of seizures.  Last evaluated 06/10/22.  Continues on keppra.  Reports no seizures.  Has OSA.  Using cpap.  Planning for colonoscopy 12/02/22.  States he is doing well.  No chest pain or sob reported.  No abdominal pain or bowel change reported.  Dr Ubaldo Glassing retired.  Request to schedule appt with another cardiologist.     Past Medical History:  Diagnosis Date   Allergy    Arthritis    Coronary artery disease    Diverticulosis of colon    GERD (gastroesophageal reflux disease)    ESOPHAGEAL REFLUX   Glaucoma    Gout    Hemorrhoids    History of kidney stones    Hx of colonic polyp    Hypercholesterolemia    Hypertension    Schatzki's ring    Seizures ([place])    Past Surgical History:  Procedure Laterality Date   CARDIAC CATHETERIZATION     COLONOSCOPY WITH PROPOFOL N/A 05/18/2017   Procedure: COLONOSCOPY WITH PROPOFOL;  Surgeon: Manya Silvas, MD;  Location: [facility] END[facility]COPY;  Service: Endoscopy;  Laterality: N/A;   CORONARY ANGIOPLASTY     EYE SURGERY  07/20/13   cataract   HERNIA REPAIR Right 2008 & 2012   VASECTOMY  1984   Family History  Problem Relation Age of Onset   Arthritis Mother    Heart disease Mother    Stroke Mother    Hypertension Mother    Arthritis Father    Hyperlipidemia Father    Heart disease Father    Hypertension Father    Parkinson's disease Father    Multiple sclerosis Sister    Colon cancer Maternal Grandmother    Arthritis Paternal Grandmother    Colon cancer Paternal Grandmother    Social History   Socioeconomic History   Marital status: Married    Spouse name: Not on file   Number of children: Not on file   Years of education: Not  on file   Highest education level: Not on file  Occupational History   Not on file  Tobacco Use   Smoking status: Never   Smokeless tobacco: Never  Vaping Use   Vaping Use: Never used  Substance and Sexual Activity   Alcohol use: No    Alcohol/week: 0.0 standard drinks of alcohol   Drug use: No   Sexual activity: Never  Other Topics Concern   Not on file  Social History Narrative   Not on file   Social Determinants of Health   Financial Resource Strain: Low Risk  (02/19/2022)   Overall Financial Resource Strain (CARDIA)    Difficulty of Paying Living Expenses: Not hard at all  Food Insecurity: No Food Insecurity (02/19/2022)   Hunger Vital Sign    Worried About Running Out of Food in the Last Year: Never true    Ran Out of Food in the Last Year: Never true  Transportation Needs: No Transportation Needs (02/19/2022)   PRAPARE - Hydrologist (Medical): No    Lack of Transportation (Non-Medical): No  Physical Activity: Not on file  Stress: No Stress Concern Present (02/19/2022)  [place] Questionnaire    Feeling of Stress : Not at all  Social Connections: Unknown (02/19/2022)   Social Connection and Isolation Panel [NHANES]    Frequency of Communication with Friends and Family: Not on file    Frequency of Social Gatherings with Friends and Family: Not on file    Attends Religious Services: Not on file    Active Member of Clubs or Organizations: Not on file    Attends Archivist Meetings: Not on file    Marital Status: Married     Review of Systems  Constitutional:  Negative for appetite change and unexpected weight change.  HENT:  Negative for congestion and sinus pressure.   Respiratory:  Negative for cough, chest tightness and shortness of breath.   Cardiovascular:  Negative for chest pain, palpitations and leg swelling.  Gastrointestinal:  Negative for abdominal pain, diarrhea,  nausea and vomiting.  Genitourinary:  Negative for difficulty urinating and dysuria.  Musculoskeletal:  Negative for joint swelling and myalgias.  Skin:  Negative for color change and rash.  Neurological:  Negative for dizziness and headaches.  Psychiatric/Behavioral:  Negative for agitation and dysphoric mood.        Objective:     BP 112/70   Pulse 88   Temp 97.9 F (36.6 C)   Resp 16   Ht '5\' 8"'$  (1.727 m)   Wt 173 lb 9.6 oz (78.7 kg)   SpO2 97%   BMI 26.40 kg/m  Wt Readings from Last 3 Encounters:  09/24/22 173 lb 9.6 oz (78.7 kg)  05/23/22 171 lb 6.4 oz (77.7 kg)  02/19/22 173 lb (78.5 kg)    Physical Exam Vitals reviewed.  Constitutional:      General: He is not in acute distress.    Appearance: Normal appearance. He is well-developed.  HENT:     Head: Normocephalic and atraumatic.     Right Ear: External ear normal.     Left Ear: External ear normal.  Eyes:     General: No scleral icterus.       Right eye: No discharge.        Left eye: No discharge.     Conjunctiva/sclera: Conjunctivae normal.  Cardiovascular:     Rate and Rhythm: Normal rate and regular rhythm.  Pulmonary:     Effort: Pulmonary effort is normal. No respiratory distress.     Breath sounds: Normal breath sounds.  Abdominal:     General: Bowel sounds are normal.     Palpations: Abdomen is soft.     Tenderness: There is no abdominal tenderness.  Musculoskeletal:        General: No swelling or tenderness.     Cervical back: Neck supple. No tenderness.  Lymphadenopathy:     Cervical: No cervical adenopathy.  Skin:    Findings: No erythema or rash.  Neurological:     Mental Status: He is alert.  Psychiatric:        Mood and Affect: Mood normal.        Behavior: Behavior normal.      Outpatient Encounter Medications as of 09/24/2022  Medication Sig   alendronate (FOSAMAX) 70 MG tablet Take 1 tablet (70 mg total) by mouth every 7 (seven) days. Take with a full glass of water on an  empty stomach.   aspirin 81 MG tablet Take 81 mg by mouth at bedtime.   azelastine (ASTELIN) 0.1 % nasal spray Place 1 spray into both  nostrils 2 (two) times daily.   Cholecalciferol (VITAMIN D3) 2000 UNITS TABS Take 2,000 Units by mouth daily.    fluticasone (FLONASE) 50 MCG/ACT nasal spray USE 2 SPRAYS IN EACH NOSTRIL EVERY DAY   hydrocortisone (ANUSOL-HC) 25 MG suppository Place 1 suppository (25 mg total) rectally 2 (two) times daily.   latanoprost (XALATAN) 0.005 % ophthalmic solution Place 1 drop into both eyes at bedtime.   levETIRAcetam (KEPPRA XR) 500 MG 24 hr tablet Take 1,000 mg by mouth daily.   levocetirizine (XYZAL) 5 MG tablet TAKE 1 TABLET EVERY EVENING   magnesium oxide (MAG-OX) 400 MG tablet Take 400 mg by mouth at bedtime.   Multiple Vitamin (MULTIVITAMIN) tablet Take 1 tablet by mouth daily.   mupirocin ointment (BACTROBAN) 2 % Apply 1 application topically 2 (two) times daily. Apply in the nose two times per day.   omeprazole (PRILOSEC) 20 MG capsule TAKE 1 CAPSULE TWICE DAILY BEFORE MEALS   polyethylene glycol (MIRALAX / GLYCOLAX) 17 g packet Take 17 g by mouth daily.   Saw [employer] 450 MG CAPS Take 450 mg by mouth daily.    simvastatin (ZOCOR) 40 MG tablet Take 40 mg by mouth every evening.    triamcinolone (KENALOG) 0.1 % Apply 1 application topically 2 (two) times daily.   No facility-administered encounter medications on file as of 09/24/2022.     Lab Results  Component Value Date   WBC 5.9 01/08/2022   HGB 14.2 01/08/2022   HCT 40.9 01/08/2022   PLT 205.0 01/08/2022   GLUCOSE 99 09/19/2022   CHOL 142 09/19/2022   TRIG 143.0 09/19/2022   HDL 35.20 (L) 09/19/2022   LDLDIRECT 99.1 07/29/2013   LDLCALC 78 09/19/2022   ALT 21 09/19/2022   AST 25 09/19/2022   NA 141 09/19/2022   K 4.2 09/19/2022   CL 104 09/19/2022   CREATININE 1.16 09/19/2022   BUN 15 09/19/2022   CO2 28 09/19/2022   TSH 2.72 09/13/2021   PSA 1.19 09/13/2021   HGBA1C 5.7 09/19/2022        Assessment & Plan:  Essential hypertension, benign Assessment & Plan: Blood pressure as outlined.  On no medication.  Follow pressures.  Follow metabolic panel.   Orders: -     Basic metabolic panel; Future -     TSH; Future  Hyperglycemia Assessment & Plan: Low carb diet and exercise.  Follow met b and a1c.   Orders: -     Hemoglobin A1c; Future  Hypercholesterolemia Assessment & Plan: Continue simvastatin.  Low cholesterol diet and exercise.  Follow lipid panel and liver function tests.   Orders: -     CBC with Differential/Platelet; Future -     Hepatic function panel; Future -     Lipid panel; Future  Prostate cancer screening -     PSA, Medicare; Future  Aortic atherosclerosis (HCC) Assessment & Plan: Continue simvastatin.  Low cholesterol diet and exercise.    Anxiety Assessment & Plan: Appears to be doing well.  Follow.     Coronary artery disease involving native coronary artery of native heart without angina pectoris Assessment & Plan: Cath as outlined. Saw cardiology 10/2021 - f/u CAD s/p PTCA/PCI.  Taking aspirin daily. Continue risk factor modification.  Currently asymptomatic.  Was seeing Dr Ubaldo Glassing.  Since Dr Ubaldo Glassing has retired, he request to see Dr Rockey Situ.   Orders: -     Ambulatory referral to Cardiology  Environmental allergies Assessment & Plan: Continue xyzal.  astelin and flonase.  Stable. Has seen ENT.    Gastroesophageal reflux disease, unspecified whether esophagitis present Assessment & Plan: Continue omeprazole.  No upper symptoms reported. No swallowing problems.  Follow.    History of colonic polyps Assessment & Plan: Colonoscopy 05/2017 - pathology - moderate active colitis, tubular adenoma. With family history of colon cancer and previous colonoscopy, was referred back to GI.  Scheduled in 12/2022 for colonoscopy.    Osteoporosis without current pathological fracture, unspecified osteoporosis type Assessment & Plan: Fosamax.   Weight bearing exercise.  Follow.    Seizure disorder [facility]) Assessment & Plan: Doing well on keppra.  Followed by neurology.       Einar Pheasant, MD

## 2022-09-28 ENCOUNTER — Encounter: Payer: Self-pay | Admitting: Internal Medicine

## 2022-09-28 NOTE — Assessment & Plan Note (Signed)
Continue simvastatin.  Low cholesterol diet and exercise.

## 2022-09-28 NOTE — Assessment & Plan Note (Signed)
Cath as outlined. Saw cardiology 10/2021 - f/u CAD s/p PTCA/PCI.  Taking aspirin daily. Continue risk factor modification.  Currently asymptomatic.  Was seeing Dr Ubaldo Glassing.  Since Dr Ubaldo Glassing has retired, he request to see Dr Rockey Situ.

## 2022-09-28 NOTE — Assessment & Plan Note (Signed)
Continue xyzal.  astelin and flonase. Stable. Has seen ENT.

## 2022-09-28 NOTE — Assessment & Plan Note (Signed)
Appears to be doing well.  Follow.

## 2022-09-28 NOTE — Assessment & Plan Note (Signed)
Colonoscopy 05/2017 - pathology - moderate active colitis, tubular adenoma. With family history of colon cancer and previous colonoscopy, was referred back to GI.  Scheduled in 12/2022 for colonoscopy.

## 2022-09-28 NOTE — Assessment & Plan Note (Signed)
Low carb diet and exercise.  Follow met b and a1c.  

## 2022-09-28 NOTE — Assessment & Plan Note (Signed)
Continue omeprazole.  No upper symptoms reported. No swallowing problems.  Follow.

## 2022-09-28 NOTE — Assessment & Plan Note (Signed)
Continue simvastatin.  Low cholesterol diet and exercise.  Follow lipid panel and liver function tests.   

## 2022-09-28 NOTE — Assessment & Plan Note (Signed)
Doing well on keppra.  Followed by neurology.

## 2022-09-28 NOTE — Assessment & Plan Note (Signed)
Fosamax.  Weight bearing exercise.  Follow.

## 2022-09-28 NOTE — Assessment & Plan Note (Signed)
Blood pressure as outlined.  On no medication.  Follow pressures.  Follow metabolic panel.

## 2022-11-19 ENCOUNTER — Other Ambulatory Visit: Payer: Self-pay | Admitting: Internal Medicine

## 2022-11-24 NOTE — Progress Notes (Unsigned)
Cardiology Office Note  Date:  11/25/2022   ID:  SACHIN GASSNER, DOB 01/02/1941, MRN [id number]  PCP:  Einar Pheasant, MD   Chief Complaint  Patient presents with   New Patient (Initial Visit)    Establish care to Dr. Rockey Situ from [facility] cardiology.  Doing well today.  Mediations reviewed verbally    HPI:  [name] is a 82 year old gentleman with past medical history of coronary artery disease  s/p PCI to the LAD with a 3.0 x 28 mm Xience Alpine DES on 01/26/2014,  obstructive sleep apnea not on CPAP,  hypercholesterolemia,  hypertension,  seizure disorder Referred by Einar Pheasant for evaluation of his coronary disease  Previously seen by [facility] cardiology  New atrial fibrillation today, reports he is relatively asymptomatic Rates in the 90s No prior history of tachyarrhythmia Review of prior EKGs showed normal sinus rhythm, last EKG September 2023  stress test completed in March 2022 was low risk.  -Lipid panel 09/13/2021- TC 128, LDL 67, HDL 31.6   No recent echocardiogram available Denies significant leg swelling, no abdominal distention  Episode of chest pain [place], happened last year, happened again this spring He is concerned for angina  EKG personally reviewed by myself on todays visit Atrial fibrillation ventricular rate 90 bpm, right bundle branch block, left anterior fascicular block  PMH:   has a past medical history of Allergy, Arthritis, Coronary artery disease, Diverticulosis of colon, GERD (gastroesophageal reflux disease), Glaucoma, Gout, Hemorrhoids, History of kidney stones, colonic polyp, Hypercholesterolemia, Hypertension, Schatzki's ring, and Seizures ([place]).  PSH:    Past Surgical History:  Procedure Laterality Date   CARDIAC CATHETERIZATION     COLONOSCOPY WITH PROPOFOL N/A 05/18/2017   Procedure: COLONOSCOPY WITH PROPOFOL;  Surgeon: Manya Silvas, MD;  Location: [facility] ENDOSCOPY;  Service:  Endoscopy;  Laterality: N/A;   CORONARY ANGIOPLASTY     EYE SURGERY  07/20/13   cataract   HERNIA REPAIR Right 2008 & 2012   VASECTOMY  1984    Current Outpatient Medications  Medication Sig Dispense Refill   aspirin 81 MG tablet Take 81 mg by mouth at bedtime.     azelastine (ASTELIN) 0.1 % nasal spray Place 1 spray into both nostrils 2 (two) times daily. 90 mL 1   Cholecalciferol (VITAMIN D3) 2000 UNITS TABS Take 2,000 Units by mouth daily.      fluticasone (FLONASE) 50 MCG/ACT nasal spray USE 2 SPRAYS IN EACH NOSTRIL EVERY DAY 48 g 10   hydrocortisone (ANUSOL-HC) 25 MG suppository Place 1 suppository (25 mg total) rectally 2 (two) times daily. 12 suppository 0   latanoprost (XALATAN) 0.005 % ophthalmic solution Place 1 drop into both eyes at bedtime.     levETIRAcetam (KEPPRA XR) 500 MG 24 hr tablet Take 1,000 mg by mouth daily.     levocetirizine (XYZAL) 5 MG tablet TAKE 1 TABLET EVERY EVENING 90 tablet 3   Magnesium Oxide 250 MG TABS Take 250 mg by mouth daily.     Multiple Vitamin (MULTIVITAMIN) tablet Take 1 tablet by mouth daily.     mupirocin ointment (BACTROBAN) 2 % Apply 1 application topically 2 (two) times daily. Apply in the nose two times per day. 22 g 0   omeprazole (PRILOSEC) 20 MG capsule TAKE 1 CAPSULE TWICE DAILY BEFORE MEALS 180 capsule 3   polyethylene glycol (MIRALAX / GLYCOLAX) 17 g packet Take 17 g by mouth daily.     Saw [employer]  450 MG CAPS Take 450 mg by mouth daily.      simvastatin (ZOCOR) 40 MG tablet Take 40 mg by mouth every evening.      alendronate (FOSAMAX) 70 MG tablet Take 1 tablet (70 mg total) by mouth every 7 (seven) days. Take with a full glass of water on an empty stomach. (Patient not taking: Reported on 11/25/2022) 4 tablet 11   triamcinolone (KENALOG) 0.1 % Apply 1 application topically 2 (two) times daily. (Patient not taking: Reported on 11/25/2022) 45 each 0   No current facility-administered medications for this visit.     Allergies:    Ketamine, Penicillins, and Sulfa antibiotics   Social History:  The patient  reports that he has never smoked. He has never used smokeless tobacco. He reports that he does not drink alcohol and does not use drugs.   Family History:   family history includes Arthritis in his father, mother, and paternal grandmother; Colon cancer in his maternal grandmother and paternal grandmother; Heart disease in his father and mother; Hyperlipidemia in his father; Hypertension in his father and mother; Multiple sclerosis in his sister; Parkinson's disease in his father; Stroke in his mother.    Review of Systems: Review of Systems  Constitutional: Negative.   HENT: Negative.    Respiratory: Negative.    Cardiovascular:  Positive for chest pain.  Gastrointestinal: Negative.   Musculoskeletal: Negative.   Neurological: Negative.   Psychiatric/Behavioral: Negative.    All other systems reviewed and are negative.    PHYSICAL EXAM: VS:  BP 118/68 (BP Location: Left Arm, Patient Position: Sitting)   Pulse 90   Ht 5\' 8"  (1.727 m)   Wt 176 lb 12.8 oz (80.2 kg)   SpO2 98%   BMI 26.88 kg/m  , BMI Body mass index is 26.88 kg/m. GEN: Well nourished, well developed, in no acute distress HEENT: normal Neck: no JVD, carotid bruits, or masses Cardiac: RRR; no murmurs, rubs, or gallops,no edema  Respiratory:  clear to auscultation bilaterally, normal work of breathing GI: soft, nontender, nondistended, + BS MS: no deformity or atrophy Skin: warm and dry, no rash Neuro:  Strength and sensation are intact Psych: euthymic mood, full affect   Recent Labs: 01/08/2022: Hemoglobin 14.2; Platelets 205.0 09/19/2022: ALT 21; BUN 15; Creatinine, Ser 1.16; Potassium 4.2; Sodium 141    Lipid Panel Lab Results  Component Value Date   CHOL 142 09/19/2022   HDL 35.20 (L) 09/19/2022   LDLCALC 78 09/19/2022   TRIG 143.0 09/19/2022      Wt Readings from Last 3 Encounters:  11/25/22 176 lb 12.8 oz (80.2 kg)   09/24/22 173 lb 9.6 oz (78.7 kg)  05/23/22 171 lb 6.4 oz (77.7 kg)       ASSESSMENT AND PLAN:  Problem List Items Addressed This Visit       Cardiology Problems   Hypercholesterolemia   Essential hypertension, benign   CAD (coronary artery disease) - Primary   Aortic atherosclerosis (HCC)     Other   Seizure disorder ([place])   Other Visit Diagnoses     OSA (obstructive sleep apnea)          New onset atrial fibrillation Denies having significant symptoms, new finding Recommend we stop aspirin, start Eliquis 5 twice daily Start metoprolol succinate 25 daily Recommend close monitoring of heart rate at home and for rates into the 50s sustained recommend he call our office, may have converted to normal sinus rhythm Echocardiogram ordered to help guide  management of atrial fibrillation We did discuss possible cardioversion versus antiarrhythmic medications  Coronary disease with stable angina Recent episodes of chest pain mowing down the bank of a pond, happened last year and again this spring concerning for angina Known history of coronary disease, prior stent 2015 We have ordered Lexiscan Myoview for further evaluation  Hyperlipidemia On simvastatin 40 mg daily, LDL slightly above goal Could consider adding Zetia 10 mg daily to achieve goal LDL less than 60 Will discuss in follow-up     Total encounter time more than 60 minutes  Greater than 50% was spent in counseling and coordination of care with the patient    Signed, Esmond Plants, M.D., Ph.D. [place], [place]

## 2022-11-25 ENCOUNTER — Encounter: Payer: Self-pay | Admitting: Internal Medicine

## 2022-11-25 ENCOUNTER — Ambulatory Visit: Payer: Medicare HMO | Attending: Cardiovascular Disease | Admitting: Cardiovascular Disease

## 2022-11-25 ENCOUNTER — Encounter: Payer: Self-pay | Admitting: Cardiovascular Disease

## 2022-11-25 VITALS — BP 118/68 | HR 90 | Ht 68.0 in | Wt 176.8 lb

## 2022-11-25 DIAGNOSIS — G40909 Epilepsy, unspecified, not intractable, without status epilepticus: Secondary | ICD-10-CM | POA: Diagnosis not present

## 2022-11-25 DIAGNOSIS — I10 Essential (primary) hypertension: Secondary | ICD-10-CM

## 2022-11-25 DIAGNOSIS — G4733 Obstructive sleep apnea (adult) (pediatric): Secondary | ICD-10-CM

## 2022-11-25 DIAGNOSIS — E7800 Pure hypercholesterolemia, unspecified: Secondary | ICD-10-CM

## 2022-11-25 DIAGNOSIS — I4891 Unspecified atrial fibrillation: Secondary | ICD-10-CM | POA: Insufficient documentation

## 2022-11-25 DIAGNOSIS — R079 Chest pain, unspecified: Secondary | ICD-10-CM

## 2022-11-25 DIAGNOSIS — I25118 Atherosclerotic heart disease of native coronary artery with other forms of angina pectoris: Secondary | ICD-10-CM

## 2022-11-25 DIAGNOSIS — I700 Atherosclerosis of aorta: Secondary | ICD-10-CM

## 2022-11-25 MED ORDER — APIXABAN 5 MG PO TABS: 5.0000 mg | ORAL_TABLET | Freq: Two times a day (BID) | ORAL | 0 refills | Status: DC

## 2022-11-25 MED ORDER — METOPROLOL SUCCINATE ER 25 MG PO TB24: 25.0000 mg | ORAL_TABLET | Freq: Every day | ORAL | 3 refills | Status: AC

## 2022-11-25 NOTE — Patient Instructions (Addendum)
Medication Instructions:  Please start aspirin Stay eliquis 5 mg twice a day (coupon free 30 days) Please start metoprolol succinate 25 mg daily Local [facility]  If you need a refill on your cardiac medications before your next appointment, please call your pharmacy.   Lab work: No new labs needed  Testing/Procedures: Echo for atrial fibrillation Your physician has requested that you have an echocardiogram. Echocardiography is a painless test that uses sound waves to create images of your heart. It provides your doctor with information about the size and shape of your heart and how well your heart's chambers and valves are working. This procedure takes approximately one hour. There are no restrictions for this procedure. Please do NOT wear cologne, perfume, aftershave, or lotions (deodorant is allowed). Please arrive 15 minutes prior to your appointment time.   [place] Call for angina Your provider has ordered a Lexiscan/ Exercise Myoview Stress test. This will take place at [facility]. Please report to the [facility] medical mall entrance. The volunteers at the first desk will direct you where to go.  [place]  Your provider has ordered a Stress Test with nuclear imaging. The purpose of this test is to evaluate the blood supply to your heart muscle. This procedure is referred to as a "Non-Invasive Stress Test." This is because other than having an IV started in your vein, nothing is inserted or "invades" your body. Cardiac stress tests are done to find areas of poor blood flow to the heart by determining the extent of coronary artery disease (CAD). Some patients exercise on a treadmill, which naturally increases the blood flow to your heart, while others who are unable to walk on a treadmill due to physical limitations will have a pharmacologic/chemical stress agent called Lexiscan . This medicine will mimic walking on a treadmill by temporarily increasing your coronary blood flow.    Please note: these test may take anywhere between 2-4 hours to complete  How to prepare for your Myoview test:  Nothing to eat for 6 hours prior to the test No caffeine for 24 hours prior to test No smoking 24 hours prior to test. Your medication may be taken with water.  If your doctor stopped a medication because of this test, do not take that medication. Ladies, please do not wear dresses.  Skirts or pants are appropriate. Please wear a short sleeve shirt. No perfume, cologne or lotion. Wear comfortable walking shoes. No heels!   PLEASE NOTIFY THE OFFICE AT LEAST 40 HOURS IN ADVANCE IF YOU ARE UNABLE TO KEEP YOUR APPOINTMENT.  [phone] AND  PLEASE NOTIFY NUCLEAR MEDICINE AT [facility] AT LEAST 24 HOURS IN ADVANCE IF YOU ARE UNABLE TO KEEP YOUR APPOINTMENT. [phone]   Follow-Up: At [facility], you and your health needs are our priority.  As part of our continuing mission to provide you with exceptional heart care, we have created designated Provider Care Teams.  These Care Teams include your primary Cardiologist (physician) and Advanced Practice Providers (APPs -  Physician Assistants and Nurse Practitioners) who all work together to provide you with the care you need, when you need it.  You will need a follow up appointment in 4 to 6 weeks  Providers on your designated Care Team:   Murray Hodgkins, NP Christell Faith, PA-C Cadence Kathlen Mody, [place]  COVID-19 Vaccine Information can be found at: ShippingScam.co.uk For questions related to vaccine distribution or appointments, please email [email or web address] or call [phone].

## 2022-11-26 NOTE — Addendum Note (Signed)
Addended by: Michel Santee on: 11/26/2022 09:17 AM   Modules accepted: Orders

## 2022-12-01 ENCOUNTER — Encounter
Admission: RE | Admit: 2022-12-01 | Discharge: 2022-12-01 | Disposition: A | Discharge status: Home / Self Care | Payer: Medicare HMO | Source: Ambulatory Visit | Attending: Cardiovascular Disease | Admitting: Cardiovascular Disease

## 2022-12-01 DIAGNOSIS — R079 Chest pain, unspecified: Secondary | ICD-10-CM | POA: Diagnosis not present

## 2022-12-01 LAB — NM MYOCAR MULTI W/SPECT W/WALL MOTION / EF
LV dias vol: 91 mL (ref 62–150)
LV sys vol: 51 mL
Nuc Stress EF: 44 %
Peak HR: 82 {beats}/min
Percent HR: 58 %
Rest HR: 70 {beats}/min
Rest Nuclear Isotope Dose: 10.4 mCi
SDS: 2
SRS: 6
SSS: 6
ST Depression (mm): 0 mm
Stress Nuclear Isotope Dose: 31.5 mCi
TID: 1.03

## 2022-12-01 MED ORDER — TECHNETIUM TC 99M TETROFOSMIN IV KIT
31.5000 | PACK | Freq: Once | INTRAVENOUS | Status: AC | PRN
  Administered 2022-12-01: 31.5 via INTRAVENOUS

## 2022-12-01 MED ORDER — TECHNETIUM TC 99M TETROFOSMIN IV KIT
10.0000 | PACK | Freq: Once | INTRAVENOUS | Status: AC | PRN
  Administered 2022-12-01: 10.39 via INTRAVENOUS

## 2022-12-01 MED ORDER — REGADENOSON 0.4 MG/5ML IV SOLN
0.4000 mg | Freq: Once | INTRAVENOUS | Status: AC
  Administered 2022-12-01: 0.4 mg via INTRAVENOUS

## 2022-12-02 ENCOUNTER — Encounter: Admission: RE | Payer: Self-pay | Source: Home / Self Care

## 2022-12-02 ENCOUNTER — Ambulatory Visit: Admission: RE | Admit: 2022-12-02 | Payer: Medicare HMO | Source: Home / Self Care

## 2022-12-02 SURGERY — COLONOSCOPY WITH PROPOFOL
Anesthesia: General

## 2022-12-05 ENCOUNTER — Telehealth: Payer: Self-pay | Admitting: Cardiovascular Disease

## 2022-12-05 DIAGNOSIS — I4891 Unspecified atrial fibrillation: Secondary | ICD-10-CM

## 2022-12-05 NOTE — Telephone Encounter (Signed)
*  STAT* If patient is at the pharmacy, call can be transferred to refill team.   1. Which medications need to be refilled? (please list name of each medication and dose if known)   apixaban (ELIQUIS) 5 MG TABS tablet    2. Which pharmacy/location (including street and city if local pharmacy) is medication to be sent to? [facility] - [place], [place] - [address]   3. Do they need a 30 day or 90 day supply? 90 day  Wife states they needed supply sent to above pharmacy and not [facility]. Pt only has 2 weeks left of medication

## 2022-12-08 MED ORDER — APIXABAN 5 MG PO TABS: 5.0000 mg | ORAL_TABLET | Freq: Two times a day (BID) | ORAL | 3 refills | Status: AC

## 2022-12-08 NOTE — Telephone Encounter (Signed)
Prescription refill request for Eliquis received. Indication: Afib Last office visit: 11/25/22 Mariah Milling) Scr: 1.16 (09/19/22)  Age: 82 Weight: 80.2kg  Appropriate dose. Refill sent.

## 2022-12-09 ENCOUNTER — Telehealth: Payer: Self-pay | Admitting: Cardiovascular Disease

## 2022-12-09 NOTE — Telephone Encounter (Signed)
Pt returning nurses call regarding test results. Please advise 

## 2022-12-09 NOTE — Telephone Encounter (Signed)
Pt made aware of test results along with MD's recommendation Pt verbalized understanding Appointment scheduled for 4/15 @ 3:20 pm   Stress test Region of ischemia noted in the mid to distal inferolateral wall concerning for blockage This may explain his chest pain when mowing May benefit from cardiac catheterization Could see if his appointment could be moved upwards with Ward Givens We also need to address his atrial fibrillation in follow-up

## 2022-12-14 NOTE — Progress Notes (Unsigned)
Cardiology Office Note  Date:  12/15/2022   ID:  Edwin [name], DOB 11/11/1940, MRN [id number]  PCP:  Dale Etowah, MD   Chief Complaint  Patient presents with   Follow up Myvoiew     "Doing well." Medications reviewed by the patient verbally.     HPI:  Edwin [name] is a 82 year old gentleman with past medical history of coronary artery disease  s/p PCI to the LAD with a 3.0 x 28 mm Xience Alpine DES on 01/26/2014,  obstructive sleep apnea not on CPAP,  hypercholesterolemia,  hypertension,  seizure disorder Persistent atrial fibrillation Who presents for follow-up of his coronary disease, persistent atrial fibrillation  Last seen by myself in clinic March 2024 Noted to be in new onset atrial fibrillation, asymptomatic prior EKGs showed normal sinus rhythm, last EKG September 2023 Started on Eliquis, metoprolol, echo ordered Echo has not been completed, has been scheduled April 30  More tired over the past several months Remains in atrial fibrillation on today's visit, rate well-controlled on metoprolol succinate 25 daily Denies significant chest pain, no leg edema, no PND orthopnea Blood pressure running low, denies significant orthostasis  Denies chest pain concerning for angina  Stress test reviewed showing very small region of possible ischemia in the distal inferolateral wall Images pulled up and reviewed with him on today's visit  EKG personally reviewed by myself on todays visit Atrial fibrillation ventricular rate 66 bpm right bundle branch block, left anterior fascicular block  Other past medical history reviewed stress test completed in March 2022 was low risk.  -Lipid panel 09/13/2021- TC 128, LDL 67, HDL 31.6   Other past medical history reviewed In the past reported having episode of chest [place],  Denies any recent episodes of angina   PMH:   has a past medical history of Allergy, Arthritis, Coronary  artery disease, Diverticulosis of colon, GERD (gastroesophageal reflux disease), Glaucoma, Gout, Hemorrhoids, History of kidney stones, colonic polyp, Hypercholesterolemia, Hypertension, Schatzki's ring, and Seizures.  PSH:    Past Surgical History:  Procedure Laterality Date   CARDIAC CATHETERIZATION     COLONOSCOPY WITH PROPOFOL N/A 05/18/2017   Procedure: COLONOSCOPY WITH PROPOFOL;  Surgeon: Scot Jun, MD;  Location: [facility];  Service: Endoscopy;  Laterality: N/A;   CORONARY ANGIOPLASTY     EYE SURGERY  07/20/13   cataract   HERNIA REPAIR Right 2008 & 2012   VASECTOMY  1984    Current Outpatient Medications  Medication Sig Dispense Refill   apixaban (ELIQUIS) 5 MG TABS tablet Take 1 tablet (5 mg total) by mouth 2 (two) times daily. 180 tablet 3   aspirin 81 MG tablet Take 81 mg by mouth at bedtime.     azelastine (ASTELIN) 0.1 % nasal spray Place 1 spray into both nostrils 2 (two) times daily. 90 mL 1   Cholecalciferol (VITAMIN D3) 2000 UNITS TABS Take 2,000 Units by mouth daily.      fluticasone (FLONASE) 50 MCG/ACT nasal spray USE 2 SPRAYS IN EACH NOSTRIL EVERY DAY 48 g 10   latanoprost (XALATAN) 0.005 % ophthalmic solution Place 1 drop into both eyes at bedtime.     levETIRAcetam (KEPPRA XR) 500 MG 24 hr tablet Take 1,000 mg by mouth daily.     levocetirizine (XYZAL) 5 MG tablet TAKE 1 TABLET EVERY EVENING 90 tablet 3   MAGNESIUM GLYCINATE PO Take 240 mg by mouth daily.     metoprolol succinate (TOPROL  XL) 25 MG 24 hr tablet Take 1 tablet (25 mg total) by mouth daily. 90 tablet 3   Multiple Vitamin (MULTIVITAMIN) tablet Take 1 tablet by mouth daily.     mupirocin ointment (BACTROBAN) 2 % Apply 1 application topically 2 (two) times daily. Apply in the nose two times per day. 22 g 0   omeprazole (PRILOSEC) 20 MG capsule TAKE 1 CAPSULE TWICE DAILY BEFORE MEALS 180 capsule 3   polyethylene glycol (MIRALAX / GLYCOLAX) 17 g packet Take 17 g by mouth daily.     Saw  Palmetto 450 MG CAPS Take 450 mg by mouth daily.      simvastatin (ZOCOR) 40 MG tablet Take 40 mg by mouth every evening.      triamcinolone (KENALOG) 0.1 % Apply 1 application topically 2 (two) times daily. 45 each 0   alendronate (FOSAMAX) 70 MG tablet Take 1 tablet (70 mg total) by mouth every 7 (seven) days. Take with a full glass of water on an empty stomach. (Patient not taking: Reported on 11/25/2022) 4 tablet 11   No current facility-administered medications for this visit.     Allergies:   Ketamine, Penicillins, and Sulfa antibiotics   Social History:  The patient  reports that he has never smoked. He has never used smokeless tobacco. He reports that he does not drink alcohol and does not use drugs.   Family History:   family history includes Arthritis in his father, mother, and paternal grandmother; Colon cancer in his maternal grandmother and paternal grandmother; Heart disease in his father and mother; Hyperlipidemia in his father; Hypertension in his father and mother; Multiple sclerosis in his sister; Parkinson's disease in his father; Stroke in his mother.    Review of Systems: Review of Systems  Constitutional:  Positive for malaise/fatigue.  HENT: Negative.    Respiratory: Negative.    Cardiovascular: Negative.   Gastrointestinal: Negative.   Musculoskeletal: Negative.   Neurological: Negative.   Psychiatric/Behavioral: Negative.    All other systems reviewed and are negative.   PHYSICAL EXAM: VS:  BP 106/60 (BP Location: Left Arm, Patient Position: Sitting, Cuff Size: Normal)   Pulse 66   Ht  (1.727 m)   Wt 177 lb 2 oz (80.3 kg)   SpO2 97%   BMI 26.93 kg/m  , BMI Body mass index is 26.93 kg/m. GEN: Well nourished, well developed, in no acute distress HEENT: normal Neck: no JVD, carotid bruits, or masses Cardiac: RRR; no murmurs, rubs, or gallops,no edema  Respiratory:  clear to auscultation bilaterally, normal work of breathing GI: soft, nontender,  nondistended, + BS MS: no deformity or atrophy Skin: warm and dry, no rash Neuro:  Strength and sensation are intact Psych: euthymic mood, full affect   Recent Labs: 01/08/2022: Hemoglobin 14.2; Platelets 205.0 09/19/2022: ALT 21; BUN 15; Creatinine, Ser 1.16; Potassium 4.2; Sodium 141    Lipid Panel Lab Results  Component Value Date   CHOL 142 09/19/2022   HDL 35.20 (L) 09/19/2022   LDLCALC 78 09/19/2022   TRIG 143.0 09/19/2022      Wt Readings from Last 3 Encounters:  12/15/22 177 lb 2 oz (80.3 kg)  11/25/22 176 lb 12.8 oz (80.2 kg)  09/24/22 173 lb 9.6 oz (78.7 kg)       ASSESSMENT AND PLAN:  Problem List Items Addressed This Visit       Cardiology Problems   Atrial fibrillation - Primary   Hypercholesterolemia   Essential hypertension, benign   CAD (  coronary artery disease)   Aortic atherosclerosis   Other Visit Diagnoses     OSA (obstructive sleep apnea)         Persistent atrial fibrillation Recommend he continue Eliquis 5 twice daily metoprolol succinate 25 daily Echocardiogram scheduled If no significant left atrial enlargement, potentially would proceed with cardioversion For significant left atrial enlargement, could consider adding antiarrhythmic such as low-dose amiodarone (antiarrhythmic options more limited in the setting of coronary disease)  Coronary disease with stable angina denies recent episodes of chest pain Very small region of possible ischemia, less notable on attenuation corrected images Given small area, lack of angina, will hold off on cardiac catheterization For any worsening chest pain symptoms concerning for angina, cardiac catheterization could be performed  Hyperlipidemia On simvastatin 40 mg daily, LDL slightly above goal consider adding Zetia 10 mg daily to achieve goal LDL less than 60    Total encounter time more than 40 minutes  Greater than 50% was spent in counseling and coordination of care with the  patient    Signed, Dossie Arbour, M.D., Ph.D. [facility] [place], [place] [phone]

## 2022-12-15 ENCOUNTER — Encounter: Payer: Self-pay | Admitting: Cardiovascular Disease

## 2022-12-15 ENCOUNTER — Ambulatory Visit: Payer: Medicare HMO | Attending: Nurse Practitioner | Admitting: Cardiovascular Disease

## 2022-12-15 VITALS — BP 106/60 | HR 66 | Ht 68.0 in | Wt 177.1 lb

## 2022-12-15 DIAGNOSIS — E7800 Pure hypercholesterolemia, unspecified: Secondary | ICD-10-CM

## 2022-12-15 DIAGNOSIS — I700 Atherosclerosis of aorta: Secondary | ICD-10-CM

## 2022-12-15 DIAGNOSIS — G4733 Obstructive sleep apnea (adult) (pediatric): Secondary | ICD-10-CM | POA: Diagnosis not present

## 2022-12-15 DIAGNOSIS — I4891 Unspecified atrial fibrillation: Secondary | ICD-10-CM

## 2022-12-15 DIAGNOSIS — I25118 Atherosclerotic heart disease of native coronary artery with other forms of angina pectoris: Secondary | ICD-10-CM | POA: Diagnosis not present

## 2022-12-15 DIAGNOSIS — I10 Essential (primary) hypertension: Secondary | ICD-10-CM | POA: Diagnosis not present

## 2022-12-15 NOTE — Patient Instructions (Addendum)
We will call you with the results of the echo in 2 weeks  We will arrange a cardioversion after echo  Medication Instructions:  No changes  If you need a refill on your cardiac medications before your next appointment, please call your pharmacy.   Lab work: No new labs needed  Testing/Procedures:  Your physician has requested that you have an echocardiogram. Echocardiography is a painless test that uses sound waves to create images of your heart. It provides your doctor with information about the size and shape of your heart and how well your heart's chambers and valves are working. This procedure takes approximately one hour. There are no restrictions for this procedure. Please do NOT wear cologne, perfume, aftershave, or lotions (deodorant is allowed). Please arrive 15 minutes prior to your appointment time.    You are scheduled for a Cardioversion on ________________ with Dr.___________ Please arrive at the [facility] at _________ a.m. on the day of your procedure.  DIET INSTRUCTIONS:  Nothing to eat or drink after midnight except your medications with a small sip of water.         Labs: CBC and BMET prior to your procedure.  Medications:  YOU MAY TAKE ALL of your remaining medications with a small amount of water.  Must have a responsible person to drive you home.  Bring a current list of your medications and current insurance cards.    If you have any questions after you get home, please call the office at [phone]  Follow-Up: At [facility], you and your health needs are our priority.  As part of our continuing mission to provide you with exceptional heart care, we have created designated Provider Care Teams.  These Care Teams include your primary Cardiologist (physician) and Advanced Practice Providers (APPs -  Physician Assistants and Nurse Practitioners) who all work together to provide you with the care you need, when you need it.  You will need a  follow up appointment in 5 to 6 weeks  Providers on your designated Care Team:   Nicolasa Ducking, NP Eula Listen, PA-C Cadence Fransico Michael, [place]  COVID-19 Vaccine Information can be found at: [email or web address] For questions related to vaccine distribution or appointments, please email [email or web address] or call [phone].

## 2022-12-16 ENCOUNTER — Encounter: Payer: Self-pay | Admitting: Internal Medicine

## 2022-12-16 NOTE — Addendum Note (Signed)
Addended by: Jani Gravel on: 12/16/2022 08:48 AM   Modules accepted: Orders

## 2022-12-23 DIAGNOSIS — H40003 Preglaucoma, unspecified, bilateral: Secondary | ICD-10-CM | POA: Diagnosis not present

## 2022-12-23 DIAGNOSIS — Z961 Presence of intraocular lens: Secondary | ICD-10-CM | POA: Diagnosis not present

## 2022-12-30 ENCOUNTER — Ambulatory Visit: Payer: Medicare HMO | Attending: Cardiovascular Disease

## 2022-12-30 DIAGNOSIS — I10 Essential (primary) hypertension: Secondary | ICD-10-CM

## 2022-12-30 DIAGNOSIS — I25118 Atherosclerotic heart disease of native coronary artery with other forms of angina pectoris: Secondary | ICD-10-CM | POA: Diagnosis not present

## 2022-12-30 DIAGNOSIS — I700 Atherosclerosis of aorta: Secondary | ICD-10-CM

## 2022-12-30 LAB — ECHOCARDIOGRAM COMPLETE
AR max vel: 3.12 cm2
AV Area VTI: 2.93 cm2
AV Area mean vel: 2.86 cm2
AV Mean grad: 2 mmHg
AV Peak grad: 3.5 mmHg
Ao pk vel: 0.94 m/s
Calc EF: 47.7 %
S' Lateral: 2.8 cm
Single Plane A2C EF: 51.4 %
Single Plane A4C EF: 43 %

## 2022-12-31 ENCOUNTER — Ambulatory Visit: Payer: Medicare HMO | Admitting: Nurse Practitioner

## 2023-01-16 ENCOUNTER — Encounter: Payer: Self-pay | Admitting: Nurse Practitioner

## 2023-01-16 ENCOUNTER — Ambulatory Visit: Payer: Medicare HMO | Attending: Nurse Practitioner | Admitting: Nurse Practitioner

## 2023-01-16 VITALS — BP 120/68 | HR 66 | Ht 68.0 in | Wt 175.8 lb

## 2023-01-16 DIAGNOSIS — G4733 Obstructive sleep apnea (adult) (pediatric): Secondary | ICD-10-CM | POA: Diagnosis not present

## 2023-01-16 DIAGNOSIS — E7800 Pure hypercholesterolemia, unspecified: Secondary | ICD-10-CM

## 2023-01-16 DIAGNOSIS — I25118 Atherosclerotic heart disease of native coronary artery with other forms of angina pectoris: Secondary | ICD-10-CM

## 2023-01-16 DIAGNOSIS — I4819 Other persistent atrial fibrillation: Secondary | ICD-10-CM

## 2023-01-16 DIAGNOSIS — I10 Essential (primary) hypertension: Secondary | ICD-10-CM | POA: Diagnosis not present

## 2023-01-16 NOTE — Patient Instructions (Signed)
Medication Instructions:  Your physician recommends that you continue on your current medications as directed. Please refer to the Current Medication list given to you today.  *If you need a refill on your cardiac medications before your next appointment, please call your pharmacy*   Lab Work: -None ordered If you have labs (blood work) drawn today and your tests are completely normal, you will receive your results only by: MyChart Message (if you have MyChart) OR A paper copy in the mail If you have any lab test that is abnormal or we need to change your treatment, we will call you to review the results.   Testing/Procedures: You are scheduled for a Cardioversion on Thursday May 23rd with Dr. Mariah Milling.    Please arrive at the [facility] Entrance of [facility], [address], [place] [address] at 7:00 pm (This is 1 hour prior to your procedure time).  Proceed to the Check-In Desk directly inside the entrance.  Procedure Parking: Use the entrance off of the [address] side of the hospital. Turn right upon entering and follow the driveway to parking that is directly in front of the [facility]. There is no valet parking available at this entrance, however there is an awning directly in front of the [facility] for drop off/ pick up for patients  DIET: Nothing to eat or drink after midnight except a sip of water with medications (see medication instructions below)  Medication Instructions: Continue your anticoagulant: apixaban (ELIQUIS) 5 MG TABS tablet If you miss a dose, please call us at [phone] You will need to continue your anticoagulant after your procedure until you are told by your provider that it is safe to stop.   Labs: If patient is on Coumadin, patient needs pt/INR, CBC, BMET within 3 days (No pt/INR needed for patients taking Xarelto, Eliquis, Pradaxa) For patients receiving anesthesia for TEE and all Cardioversion patients: BMET,  CBC within 1 week  FYI: For your safety, and to allow [place] to monitor your vital signs accurately during the surgery/procedure we request that if you have artificial nails, gel coating, SNS etc. Please have those removed prior to your surgery/procedure. Not having the nail coverings /polish removed may result in cancellation or delay of your surgery/procedure.  You must have a responsible person to drive you home and stay in the waiting area during your procedure. Failure to do so could result in cancellation.  Bring your insurance cards.  If you have any questions after you get home, please call the office at [phone]  *Special Note: Every effort is made to have your procedure done on time. Occasionally there are emergencies that occur at the hospital that may cause delays. Please be patient if a delay does occur.        Follow-Up: At [facility], you and your health needs are our priority.  As part of our continuing mission to provide you with exceptional heart care, we have created designated Provider Care Teams.  These Care Teams include your primary Cardiologist (physician) and Advanced Practice Providers (APPs -  Physician Assistants and Nurse Practitioners) who all work together to provide you with the care you need, when you need it.  We recommend signing up for the patient portal called "MyChart".  Sign up information is provided on this After Visit Summary.  MyChart is used to connect with patients for Virtual Visits (Telemedicine).  Patients are able to view lab/test results, encounter notes,  upcoming appointments, etc.  Non-urgent messages can be sent to your provider as well.   To learn more about what you can do with MyChart, go to [email or web address].    Your next appointment:   3 week(s)  Provider:   You may see Julien Nordmann, MD or one of the following Advanced Practice Providers on your designated Care Team:   Nicolasa Ducking, NP  Other  Instructions -None

## 2023-01-16 NOTE — Progress Notes (Signed)
Office Visit    Patient Name: Edwin [name] Date of Encounter: 01/16/2023  Primary Care Provider:  Dale Dale, MD Primary Cardiologist:  Julien Nordmann, MD  Chief Complaint    82 year old male with a history of CAD, hypertension, hyperlipidemia, persistent atrial fibrillation, sleep apnea, and seizure disorder, presents for follow-up related to afib.  Past Medical History    Past Medical History:  Diagnosis Date   Allergy    Arthritis    Coronary artery disease    a. 12/2013 PCI: LM nl, LAD 50p, 69m (3.0x28 Alpine Xience DES), D1 90, LCX nl, OM1 70, RCA nl; b. 12/2022 MV: apical-mid inflat ischemia, mildly reduced EF.   Diverticulosis of colon    GERD (gastroesophageal reflux disease)    ESOPHAGEAL REFLUX   Glaucoma    Gout    Hemorrhoids    History of kidney stones    Hx of colonic polyp    Hypercholesterolemia    Hypertension    LVH (left ventricular hypertrophy)    a. 12/2022 Echo: EF 60-65%, no rwma, mod conc LVH. Nl RV fxn. RVSP . Mild to mod LAE. Mild MR/AI.   Schatzki's ring    Seizures (HCC)    Past Surgical History:  Procedure Laterality Date   CARDIAC CATHETERIZATION     COLONOSCOPY WITH PROPOFOL N/A 05/18/2017   Procedure: COLONOSCOPY WITH PROPOFOL;  Surgeon: Scot Jun, MD;  Location: [facility] ENDOSCOPY;  Service: Endoscopy;  Laterality: N/A;   CORONARY ANGIOPLASTY     EYE SURGERY  07/20/13   cataract   HERNIA REPAIR Right 2008 & 2012   VASECTOMY  1984    Allergies  Allergies  Allergen Reactions   Ketamine Anxiety and Other (See Comments)   Penicillins Rash   Sulfa Antibiotics Rash    History of Present Illness    82 y/o ? w/ a h/p CAD, hypertension, hyperlipidemia, persistent atrial fibrillation, sleep apnea, seizure disorder.  He previously underwent PCI drug-eluting stent placement to the LAD in May 2015.  He had moderate residual diagonal and obtuse marginal disease at that time.  In March of this year, he was seen in  clinic with finding of new onset asymptomatic atrial fibrillation.  He was placed on metoprolol and Eliquis.  He also reported increasing fatigue.  Stress testing was performed and showed a small region of distal inferolateral ischemia.  At follow-up office visit on April 15, he denies chest pain/was made to continue medical therapy.  He remained in atrial fibrillation with plan for potential cardioversion.  Subsequent echo showed normal LV function with mild MR/AI, and mildly to moderately dilated left atrium.  Since his last visit, he has felt well.  He denies chest pain, palpitations, dyspnea, pnd, orthopnea, n, v, dizziness, syncope, edema, weight gain, or early satiety.  Though he says that he is never felt his atrial fibrillation, he thinks that since starting metoprolol, he is felt a little bit better.  He and his wife have many questions today regarding ongoing management and role of cardioversion and/or antiarrhythmic therapy.  All questions answered.  Home Medications    Current Outpatient Medications  Medication Sig Dispense Refill   apixaban (ELIQUIS) 5 MG TABS tablet Take 1 tablet (5 mg total) by mouth 2 (two) times daily. 180 tablet 3   aspirin 81 MG tablet Take 81 mg by mouth at bedtime.     azelastine (ASTELIN) 0.1 % nasal spray Place 1 spray into both nostrils 2 (two) times daily. 90 mL  1   Cholecalciferol (VITAMIN D3) 2000 UNITS TABS Take 2,000 Units by mouth daily.      fluticasone (FLONASE) 50 MCG/ACT nasal spray USE 2 SPRAYS IN EACH NOSTRIL EVERY DAY 48 g 10   ketoconazole (NIZORAL) 2 % shampoo Apply topically. 2-3 times weekly     latanoprost (XALATAN) 0.005 % ophthalmic solution Place 1 drop into both eyes at bedtime.     levocetirizine (XYZAL) 5 MG tablet TAKE 1 TABLET EVERY EVENING 90 tablet 3   MAGNESIUM GLYCINATE PO Take 240 mg by mouth daily.     metoprolol succinate (TOPROL XL) 25 MG 24 hr tablet Take 1 tablet (25 mg total) by mouth daily. 90 tablet 3   Multiple Vitamin  (MULTIVITAMIN) tablet Take 1 tablet by mouth daily.     mupirocin ointment (BACTROBAN) 2 % Apply 1 application topically 2 (two) times daily. Apply in the nose two times per day. 22 g 0   Omega-3 Fatty Acids (FISH OIL) 1200 MG CAPS Take 1,200 mg by mouth daily.     omeprazole (PRILOSEC) 20 MG capsule TAKE 1 CAPSULE TWICE DAILY BEFORE MEALS 180 capsule 3   polyethylene glycol (MIRALAX / GLYCOLAX) 17 g packet Take 17 g by mouth daily.     Psyllium 100 % POWD 1 TSP Daily     Saw Palmetto 450 MG CAPS Take 450 mg by mouth daily.      simvastatin (ZOCOR) 40 MG tablet Take 40 mg by mouth every evening.      triamcinolone (KENALOG) 0.1 % Apply 1 application topically 2 (two) times daily. 45 each 0   Zinc 50 MG TABS Take 50 mg by mouth daily.     alendronate (FOSAMAX) 70 MG tablet Take 1 tablet (70 mg total) by mouth every 7 (seven) days. Take with a full glass of water on an empty stomach. (Patient not taking: Reported on 11/25/2022) 4 tablet 11   levETIRAcetam (KEPPRA XR) 500 MG 24 hr tablet Take 1,000 mg by mouth daily.     No current facility-administered medications for this visit.     Review of Systems    He denies chest pain, palpitations, dyspnea, pnd, orthopnea, n, v, dizziness, syncope, edema, weight gain, or early satiety.  All other systems reviewed and are otherwise negative except as noted above.    Physical Exam    VS:  BP 120/68 (BP Location: Left Arm, Patient Position: Sitting, Cuff Size: Normal)   Pulse 66   Ht 5\' 8"  (1.727 m)   Wt 175 lb 12.8 oz (79.7 kg)   SpO2 96%   BMI 26.73 kg/m  , BMI Body mass index is 26.73 kg/m.     GEN: Well nourished, well developed, in no acute distress. HEENT: normal. Neck: Supple, no JVD, carotid bruits, or masses. Cardiac: Irregularly irregular, no murmurs, rubs, or gallops. No clubbing, cyanosis, edema.  Radials 2+/PT 2+ and equal bilaterally.  Respiratory:  Respirations regular and unlabored, clear to auscultation bilaterally. GI: Soft,  nontender, nondistended, BS + x 4. MS: no deformity or atrophy. Skin: warm and dry, no rash. Neuro:  Strength and sensation are intact. Psych: Normal affect.  Accessory Clinical Findings    ECG personally reviewed by me today -atrial fibrillation, 66, left axis deviation, inferior infarct, right bundle much block- no acute changes.  Lab Results  Component Value Date   WBC 5.9 01/08/2022   HGB 14.2 01/08/2022   HCT 40.9 01/08/2022   MCV 84.8 01/08/2022   PLT 205.0 01/08/2022  Lab Results  Component Value Date   CREATININE 1.16 09/19/2022   BUN 15 09/19/2022   NA 141 09/19/2022   K 4.2 09/19/2022   CL 104 09/19/2022   CO2 28 09/19/2022   Lab Results  Component Value Date   ALT 21 09/19/2022   AST 25 09/19/2022   ALKPHOS 56 09/19/2022   BILITOT 1.0 09/19/2022   Lab Results  Component Value Date   CHOL 142 09/19/2022   HDL 35.20 (L) 09/19/2022   LDLCALC 78 09/19/2022   LDLDIRECT 99.1 07/29/2013   TRIG 143.0 09/19/2022   CHOLHDL 4 09/19/2022    Lab Results  Component Value Date   HGBA1C 5.7 09/19/2022    Assessment & Plan    1.  Persistent atrial fibrillation: Patient incidentally noted to be in atrial fibrillation at March visit.  He has not been particularly symptomatic.  Recent stress testing was of intermediate risk with a small area of apical-mid inferolateral ischemia.  Echo shows normal LV function with mildly to moderately dilated left atrium.  Patient remains rate controlled atrial fibrillation at 66 bpm on metoprolol and Eliquis.  He thinks that since starting metoprolol, he has felt a little bit better, though notes he never really felt that badly.  Long discussion today about the role of rate versus rhythm control as well as cardioversion +/- oral antiarrhythmic therapy.  All questions answered.  At this time, patient prefers to avoid antiarrhythmic therapy and would like to proceed with cardioversion, which we will try and schedule for next week. We  discussed the low risk nature of direct current cardioversion today, as well as benefits, and he is willing to proceed. He has labs scheduled for Tuesday through his primary care provider's office, which include a CBC and basic metabolic panel.  Continue current dose of metoprolol and Eliquis (not missed any doses) with CHA2DS2VASc 4.    2.  Coronary artery disease: Status post prior LAD stenting in May 2015.  He recently underwent stress testing in the setting of new finding of atrial fibrillation and perhaps some dyspnea, which showed a small area of apical to mid inferolateral ischemia.  He has been conservatively managed.  He does not experience chest pain, and overall has been feeling well.  Long discussion today with patient and his wife about the results of the study and appropriate medical therapy.  He remains on low-dose aspirin, beta-blocker, and statin therapy.  3.  Essential hypertension: Blood pressure is normal.  Historically, patient not treated for high blood pressure, though this remains on his history.  He is currently on metoprolol therapy in the setting of recent diagnosis of atrial fibrillation.  4.  Hyperlipidemia: LDL of 78 earlier this year.  He is on simvastatin 40 mg in the evening.  LDL goal less than 70.  Will defer adding Zetia at this time given recent changes to medical therapy, but can discuss at follow-up.  5.  Obstructive sleep apnea: Sleeps well and uses CPAP.  6.  Disposition: Patient will follow-up lab work with his primary care provider next Tuesday and we will schedule him for cardioversion next Thursday with Dr. Mariah Milling.  Follow-up 2 weeks post cardioversion.  Nicolasa Ducking, NP 01/16/2023, 10:17 AM

## 2023-01-20 ENCOUNTER — Other Ambulatory Visit (INDEPENDENT_AMBULATORY_CARE_PROVIDER_SITE_OTHER): Payer: Medicare HMO

## 2023-01-20 ENCOUNTER — Telehealth: Payer: Self-pay | Admitting: Cardiovascular Disease

## 2023-01-20 DIAGNOSIS — E7800 Pure hypercholesterolemia, unspecified: Secondary | ICD-10-CM | POA: Diagnosis not present

## 2023-01-20 DIAGNOSIS — R739 Hyperglycemia, unspecified: Secondary | ICD-10-CM | POA: Diagnosis not present

## 2023-01-20 DIAGNOSIS — Z125 Encounter for screening for malignant neoplasm of prostate: Secondary | ICD-10-CM | POA: Diagnosis not present

## 2023-01-20 DIAGNOSIS — I10 Essential (primary) hypertension: Secondary | ICD-10-CM

## 2023-01-20 LAB — CBC WITH DIFFERENTIAL/PLATELET
Basophils Absolute: 0.1 10*3/uL (ref 0.0–0.1)
Basophils Relative: 0.9 % (ref 0.0–3.0)
Eosinophils Absolute: 0.2 10*3/uL (ref 0.0–0.7)
Eosinophils Relative: 3.7 % (ref 0.0–5.0)
HCT: 44.4 % (ref 39.0–52.0)
Hemoglobin: 15 g/dL (ref 13.0–17.0)
Lymphocytes Relative: 32.2 % (ref 12.0–46.0)
Lymphs Abs: 2.1 10*3/uL (ref 0.7–4.0)
MCHC: 33.9 g/dL (ref 30.0–36.0)
MCV: 85.7 fl (ref 78.0–100.0)
Monocytes Absolute: 0.6 10*3/uL (ref 0.1–1.0)
Monocytes Relative: 9.1 % (ref 3.0–12.0)
Neutro Abs: 3.5 10*3/uL (ref 1.4–7.7)
Neutrophils Relative %: 54.1 % (ref 43.0–77.0)
Platelets: 231 10*3/uL (ref 150.0–400.0)
RBC: 5.18 Mil/uL (ref 4.22–5.81)
RDW: 13.9 % (ref 11.5–15.5)
WBC: 6.4 10*3/uL (ref 4.0–10.5)

## 2023-01-20 LAB — BASIC METABOLIC PANEL
BUN: 17 mg/dL (ref 6–23)
CO2: 25 mEq/L (ref 19–32)
Calcium: 9.5 mg/dL (ref 8.4–10.5)
Chloride: 103 mEq/L (ref 96–112)
Creatinine, Ser: 1.13 mg/dL (ref 0.40–1.50)
GFR: 60.91 mL/min (ref >60.00)
Glucose, Bld: 96 mg/dL (ref 70–99)
Potassium: 4.1 mEq/L (ref 3.5–5.1)
Sodium: 138 mEq/L (ref 135–145)

## 2023-01-20 LAB — HEPATIC FUNCTION PANEL
ALT: 23 U/L (ref 0–53)
AST: 25 U/L (ref 0–37)
Albumin: 4 g/dL (ref 3.5–5.2)
Alkaline Phosphatase: 58 U/L (ref 39–117)
Bilirubin, Direct: 0.2 mg/dL (ref 0.0–0.3)
Total Bilirubin: 1 mg/dL (ref 0.2–1.2)
Total Protein: 6.8 g/dL (ref 6.0–8.3)

## 2023-01-20 LAB — TSH: TSH: 3.42 u[IU]/mL (ref 0.35–5.50)

## 2023-01-20 LAB — LIPID PANEL
Cholesterol: 135 mg/dL (ref 0–200)
HDL: 31.1 mg/dL — ABNORMAL LOW (ref >39.00)
LDL Cholesterol: 70 mg/dL (ref 0–99)
NonHDL: 104.39
Total CHOL/HDL Ratio: 4
Triglycerides: 172 mg/dL — ABNORMAL HIGH (ref 0.0–149.0)
VLDL: 34.4 mg/dL (ref 0.0–40.0)

## 2023-01-20 LAB — PSA, MEDICARE: PSA: 1.04 ng/ml (ref 0.10–4.00)

## 2023-01-20 LAB — HEMOGLOBIN A1C: Hgb A1c MFr Bld: 5.8 % (ref 4.6–6.5)

## 2023-01-20 NOTE — Telephone Encounter (Signed)
Patient is requesting call back to discuss cardioversion scheduled. He would like to cancel the procedure and would like a call back to discuss.

## 2023-01-20 NOTE — Telephone Encounter (Signed)
Patient has been advised about the Amiodarone. He would like to think about it and will call back tomorrow.  The cardioversion has been canceled.

## 2023-01-20 NOTE — Telephone Encounter (Signed)
Thanks for the heads up.  If he'd like to start amiodarone, as we discussed, we should send in amiodarone 200mg  BID.  That way, he'd have 3 wks in his system by the time of his follow-up appt and we can reassess management with antiarrhythmic on board at that time.

## 2023-01-20 NOTE — Telephone Encounter (Signed)
Pt called stating he would like to hold off on cardioversion for now and revisit other options at office visit scheduled for 02/11/23.   Will forward to NP to make aware and for approval to cancel.

## 2023-01-21 DIAGNOSIS — I25119 Atherosclerotic heart disease of native coronary artery with unspecified angina pectoris: Secondary | ICD-10-CM | POA: Diagnosis not present

## 2023-01-21 DIAGNOSIS — G4733 Obstructive sleep apnea (adult) (pediatric): Secondary | ICD-10-CM | POA: Diagnosis not present

## 2023-01-21 DIAGNOSIS — I10 Essential (primary) hypertension: Secondary | ICD-10-CM | POA: Diagnosis not present

## 2023-01-21 DIAGNOSIS — I2510 Atherosclerotic heart disease of native coronary artery without angina pectoris: Secondary | ICD-10-CM | POA: Diagnosis not present

## 2023-01-21 DIAGNOSIS — E7800 Pure hypercholesterolemia, unspecified: Secondary | ICD-10-CM | POA: Diagnosis not present

## 2023-01-21 DIAGNOSIS — Z9861 Coronary angioplasty status: Secondary | ICD-10-CM | POA: Diagnosis not present

## 2023-01-21 DIAGNOSIS — I4891 Unspecified atrial fibrillation: Secondary | ICD-10-CM | POA: Diagnosis not present

## 2023-01-22 ENCOUNTER — Ambulatory Visit: Admission: RE | Admit: 2023-01-22 | Payer: Medicare HMO | Source: Home / Self Care | Admitting: Cardiovascular Disease

## 2023-01-22 ENCOUNTER — Encounter: Admission: RE | Payer: Self-pay | Source: Home / Self Care

## 2023-01-22 SURGERY — CARDIOVERSION
Anesthesia: General

## 2023-01-23 ENCOUNTER — Ambulatory Visit (INDEPENDENT_AMBULATORY_CARE_PROVIDER_SITE_OTHER): Payer: Medicare HMO | Admitting: Internal Medicine

## 2023-01-23 ENCOUNTER — Encounter: Payer: Self-pay | Admitting: Internal Medicine

## 2023-01-23 ENCOUNTER — Ambulatory Visit: Payer: Medicare HMO | Admitting: Internal Medicine

## 2023-01-23 VITALS — BP 124/72 | HR 67 | Temp 97.8°F | Ht 68.0 in | Wt 177.2 lb

## 2023-01-23 DIAGNOSIS — I2510 Atherosclerotic heart disease of native coronary artery without angina pectoris: Secondary | ICD-10-CM

## 2023-01-23 DIAGNOSIS — I25119 Atherosclerotic heart disease of native coronary artery with unspecified angina pectoris: Secondary | ICD-10-CM | POA: Diagnosis not present

## 2023-01-23 DIAGNOSIS — I10 Essential (primary) hypertension: Secondary | ICD-10-CM

## 2023-01-23 DIAGNOSIS — Z9109 Other allergy status, other than to drugs and biological substances: Secondary | ICD-10-CM

## 2023-01-23 DIAGNOSIS — I4891 Unspecified atrial fibrillation: Secondary | ICD-10-CM

## 2023-01-23 DIAGNOSIS — E7800 Pure hypercholesterolemia, unspecified: Secondary | ICD-10-CM | POA: Diagnosis not present

## 2023-01-23 DIAGNOSIS — R739 Hyperglycemia, unspecified: Secondary | ICD-10-CM

## 2023-01-23 DIAGNOSIS — G40909 Epilepsy, unspecified, not intractable, without status epilepticus: Secondary | ICD-10-CM

## 2023-01-23 DIAGNOSIS — F419 Anxiety disorder, unspecified: Secondary | ICD-10-CM

## 2023-01-23 DIAGNOSIS — I700 Atherosclerosis of aorta: Secondary | ICD-10-CM | POA: Diagnosis not present

## 2023-01-23 DIAGNOSIS — G4730 Sleep apnea, unspecified: Secondary | ICD-10-CM

## 2023-01-23 DIAGNOSIS — K219 Gastro-esophageal reflux disease without esophagitis: Secondary | ICD-10-CM

## 2023-01-23 MED ORDER — ROSUVASTATIN CALCIUM 40 MG PO TABS
40.0000 mg | ORAL_TABLET | Freq: Every day | ORAL | 1 refills | Status: AC
Start: 2023-01-23 — End: ?

## 2023-01-23 NOTE — Telephone Encounter (Signed)
Left a message for the patient to call back.  

## 2023-01-23 NOTE — Progress Notes (Unsigned)
Subjective:    Patient ID: Edwin [name], male    DOB: 1941/02/15, 82 y.o.   MRN: [id number]  Patient here for  Chief Complaint  Patient presents with   Medical Management of Chronic Issues    HPI Here to follow up regarding his blood pressure, cholesterol and GERD.  Followed by neurology - history of seizures.  Last evaluated 06/10/22.  Continues on keppra.  Reports no seizures.  Has OSA.  Using cpap. Saw Dr Mariah Milling 11/25/22 - new afib.  Started metoprolol and eliquis. ECHO  and lexiscan myoview ordered.  Very small region of possible ischemia, less notable on attenuation corrected images.  Dr Mariah Milling felt given small area, lack of angina, will hold off on cardiac catheterization. Echo showed normal LV function with mild MR/AI, and mildly to moderately dilated left atrium.    Past Medical History:  Diagnosis Date   Allergy    Arthritis    Coronary artery disease    a. 12/2013 PCI: LM nl, LAD 50p, 70m (3.0x28 Alpine Xience DES), D1 90, LCX nl, OM1 70, RCA nl; b. 12/2022 MV: apical-mid inflat ischemia, mildly reduced EF.   Diverticulosis of colon    GERD (gastroesophageal reflux disease)    ESOPHAGEAL REFLUX   Glaucoma    Gout    Hemorrhoids    History of kidney stones    Hx of colonic polyp    Hypercholesterolemia    Hypertension    LVH (left ventricular hypertrophy)    a. 12/2022 Echo: EF 60-65%, no rwma, mod conc LVH. Nl RV fxn. RVSP . Mild to mod LAE. Mild MR/AI.   Schatzki's ring    Seizures (HCC)    Past Surgical History:  Procedure Laterality Date   CARDIAC CATHETERIZATION     COLONOSCOPY WITH PROPOFOL N/A 05/18/2017   Procedure: COLONOSCOPY WITH PROPOFOL;  Surgeon: Scot Jun, MD;  Location: [facility] ENDOSCOPY;  Service: Endoscopy;  Laterality: N/A;   CORONARY ANGIOPLASTY     EYE SURGERY  07/20/13   cataract   HERNIA REPAIR Right 2008 & 2012   VASECTOMY  1984   Family History  Problem Relation Age of Onset   Arthritis Mother    Heart disease Mother     Stroke Mother    Hypertension Mother    Arthritis Father    Hyperlipidemia Father    Heart disease Father    Hypertension Father    Parkinson's disease Father    Multiple sclerosis Sister    Colon cancer Maternal Grandmother    Arthritis Paternal Grandmother    Colon cancer Paternal Grandmother    Social History   Socioeconomic History   Marital status: Married    Spouse name: Not on file   Number of children: Not on file   Years of education: Not on file   Highest education level: Not on file  Occupational History   Not on file  Tobacco Use   Smoking status: Never   Smokeless tobacco: Never  Vaping Use   Vaping Use: Never used  Substance and Sexual Activity   Alcohol use: No    Alcohol/week: 0.0 standard drinks of alcohol   Drug use: No   Sexual activity: Never  Other Topics Concern   Not on file  Social History Narrative   Not on file   Social Determinants of Health   Financial Resource Strain: Low Risk  (02/19/2022)   Overall Financial Resource Strain (CARDIA)    Difficulty of Paying Living Expenses: Not hard at  all  Food Insecurity: No Food Insecurity (02/19/2022)   Hunger Vital Sign    Worried About Running Out of Food in the Last Year: Never true    Ran Out of Food in the Last Year: Never true  Transportation Needs: No Transportation Needs (02/19/2022)   PRAPARE - Administrator, Civil Service (Medical): No    Lack of Transportation (Non-Medical): No  Physical Activity: Not on file  Stress: No Stress Concern Present (02/19/2022)   [employer] of Occupational Health - Occupational Stress Questionnaire    Feeling of Stress : Not at all  Social Connections: Unknown (02/19/2022)   Social Connection and Isolation Panel [NHANES]    Frequency of Communication with Friends and Family: Not on file    Frequency of Social Gatherings with Friends and Family: Not on file    Attends Religious Services: Not on file    Active Member of Clubs or  Organizations: Not on file    Attends Engineer, structural: Not on file    Marital Status: Married     Review of Systems     Objective:     BP 124/72   Pulse 67   Temp 97.8 F (36.6 C) (Oral)   Ht 5\' 8"  (1.727 m)   Wt 177 lb 3.2 oz (80.4 kg)   SpO2 97%   BMI 26.94 kg/m  Wt Readings from Last 3 Encounters:  01/23/23 177 lb 3.2 oz (80.4 kg)  01/16/23 175 lb 12.8 oz (79.7 kg)  12/15/22 177 lb 2 oz (80.3 kg)    Physical Exam   Outpatient Encounter Medications as of 01/23/2023  Medication Sig   apixaban (ELIQUIS) 5 MG TABS tablet Take 1 tablet (5 mg total) by mouth 2 (two) times daily.   aspirin 81 MG tablet Take 81 mg by mouth at bedtime.   azelastine (ASTELIN) 0.1 % nasal spray Place 1 spray into both nostrils 2 (two) times daily.   Cholecalciferol (VITAMIN D3) 2000 UNITS TABS Take 2,000 Units by mouth daily.    fluticasone (FLONASE) 50 MCG/ACT nasal spray USE 2 SPRAYS IN EACH NOSTRIL EVERY DAY   ketoconazole (NIZORAL) 2 % shampoo Apply topically. 2-3 times weekly   latanoprost (XALATAN) 0.005 % ophthalmic solution Place 1 drop into both eyes at bedtime.   levETIRAcetam (KEPPRA XR) 500 MG 24 hr tablet Take 1,000 mg by mouth daily.   levocetirizine (XYZAL) 5 MG tablet TAKE 1 TABLET EVERY EVENING   MAGNESIUM GLYCINATE PO Take 240 mg by mouth daily.   metoprolol succinate (TOPROL XL) 25 MG 24 hr tablet Take 1 tablet (25 mg total) by mouth daily.   Multiple Vitamin (MULTIVITAMIN) tablet Take 1 tablet by mouth daily.   mupirocin ointment (BACTROBAN) 2 % Apply 1 application topically 2 (two) times daily. Apply in the nose two times per day.   Omega-3 Fatty Acids (FISH OIL) 1200 MG CAPS Take 1,200 mg by mouth daily.   omeprazole (PRILOSEC) 20 MG capsule TAKE 1 CAPSULE TWICE DAILY BEFORE MEALS   polyethylene glycol (MIRALAX / GLYCOLAX) 17 g packet Take 17 g by mouth daily.   Psyllium 100 % POWD 1 TSP Daily   rosuvastatin (CRESTOR) 40 MG tablet Take 1 tablet (40 mg total)  by mouth daily.   Saw Palmetto 450 MG CAPS Take 450 mg by mouth daily.    triamcinolone (KENALOG) 0.1 % Apply 1 application topically 2 (two) times daily.   Zinc 50 MG TABS Take 50 mg by mouth daily.   [  DISCONTINUED] simvastatin (ZOCOR) 40 MG tablet Take 40 mg by mouth every evening.    No facility-administered encounter medications on file as of 01/23/2023.     Lab Results  Component Value Date   WBC 6.4 01/20/2023   HGB 15.0 01/20/2023   HCT 44.4 01/20/2023   PLT 231.0 01/20/2023   GLUCOSE 96 01/20/2023   CHOL 135 01/20/2023   TRIG 172.0 (H) 01/20/2023   HDL 31.10 (L) 01/20/2023   LDLDIRECT 99.1 07/29/2013   LDLCALC 70 01/20/2023   ALT 23 01/20/2023   AST 25 01/20/2023   NA 138 01/20/2023   K 4.1 01/20/2023   CL 103 01/20/2023   CREATININE 1.13 01/20/2023   BUN 17 01/20/2023   CO2 25 01/20/2023   TSH 3.42 01/20/2023   PSA 1.04 01/20/2023   HGBA1C 5.8 01/20/2023    NM Myocar Multi W/Spect W/Wall Motion / EF  Result Date: 12/01/2022   Findings are consistent with ischemia. The study is intermediate risk.   No ST deviation was noted.   LV perfusion is abnormal. There is evidence of ischemia. There is no evidence of infarction. Defect 1: There is a medium defect with moderate reduction in uptake present in the apical to mid inferolateral location(s) that is reversible. There is normal wall motion in the defect area. Consistent with ischemia.   Left ventricular function is abnormal. Global function is mildly reduced. End diastolic cavity size is normal. End systolic cavity size is normal.   Suboptimal study due to intense GI uptake.       Assessment & Plan:  Essential hypertension, benign -     Basic metabolic panel; Future -     Hepatic function panel; Future  Hyperglycemia  Hypercholesterolemia -     Lipid panel; Future  Aortic atherosclerosis (HCC)  Anxiety  Coronary artery disease involving native coronary artery of native heart without angina pectoris  Other  orders -     Rosuvastatin Calcium; Take 1 tablet (40 mg total) by mouth daily.  Dispense: 90 tablet; Refill: 1     Dale Ellsworth, MD

## 2023-01-23 NOTE — Patient Instructions (Signed)
Stop simvastatin.  Start crestor 40mg  one tablet per day.

## 2023-01-24 ENCOUNTER — Telehealth: Payer: Self-pay | Admitting: Internal Medicine

## 2023-01-24 ENCOUNTER — Encounter: Payer: Self-pay | Admitting: Internal Medicine

## 2023-01-24 NOTE — Assessment & Plan Note (Signed)
Low carb diet and exercise.  Follow met b and a1c.   

## 2023-01-24 NOTE — Assessment & Plan Note (Signed)
Continue xyzal.  astelin and flonase. Stable. Has seen ENT.  

## 2023-01-24 NOTE — Assessment & Plan Note (Signed)
Dr Mariah Milling 10/2022 - New onset atrial fibrillation. stop aspirin, start Eliquis 5 twice daily. Start metoprolol succinate 25 daily. Plan ECHO. F/u 01/16/23 - recent stress testing was of intermediate risk with a small area of apical-mid inferolateral ischemia. Echo shows normal LV function with mildly to moderately dilated left atrium. Elected to hold on cardioversion.  Wants to continue current medications - eliquis and metoprolol for now.  Follow.

## 2023-01-24 NOTE — Assessment & Plan Note (Signed)
Has known sleep apnea.  CPAP.  Questioning new cpap supplies.

## 2023-01-24 NOTE — Assessment & Plan Note (Signed)
Heart catheterization 01/26/14 - proximal LAD 50%, mid LAD 99%, small first diagonal - 90% stenosis, first obtuse marginal 70% and small non dominant RCA.  S/P stent placement mid LAD (99%-0%).   Dr Mariah Milling f/u 12/2022 - Stress test reviewed showing very small region of possible ischemia in the distal inferolateral wall.  Hold on cath - no symptoms. Will change simvastatin to crestor 40mg  q day - goal LDL < 70.

## 2023-01-24 NOTE — Assessment & Plan Note (Signed)
Blood pressure as outlined.  On metoprolol.  Follow pressures. Follow metabolic panel.  

## 2023-01-24 NOTE — Assessment & Plan Note (Signed)
Doing well on keppra.  Followed by neurology.  

## 2023-01-24 NOTE — Assessment & Plan Note (Signed)
Has been on simvastatin.  Change to crestor.

## 2023-01-24 NOTE — Telephone Encounter (Signed)
Has known sleep apnea.  When here at visit, was questioning cpap, supplies.  Please clarify what company he is using. If he is having trouble, can refer to pulmonary for further evaluation and f/u - may need repeat testing for sleep apnea.

## 2023-01-24 NOTE — Assessment & Plan Note (Signed)
Continue omeprazole.  No upper symptoms reported. No swallowing problems.  Follow.  ?

## 2023-01-24 NOTE — Assessment & Plan Note (Signed)
Has been on simvastatin. Will change to crestor 40mg  q day.  Goal LDL <70.  Low cholesterol diet and exercise.  Follow lipid panel and liver function tests.

## 2023-01-24 NOTE — Assessment & Plan Note (Signed)
Appears to be doing well.  Follow.  

## 2023-01-27 NOTE — Telephone Encounter (Signed)
Patients wife stated that they met with [facility] last week and got a new mask so nothing is needed at this time. Will let us know if this changes.

## 2023-01-28 NOTE — Telephone Encounter (Signed)
Called the patient back to inquire if he would like to try the amiodarone. He declined at this time and would not go into detail as to why.

## 2023-02-11 ENCOUNTER — Ambulatory Visit: Payer: Medicare HMO | Admitting: Nurse Practitioner

## 2023-03-04 ENCOUNTER — Ambulatory Visit (INDEPENDENT_AMBULATORY_CARE_PROVIDER_SITE_OTHER): Payer: Medicare HMO | Admitting: *Deleted

## 2023-03-04 VITALS — Ht 68.0 in | Wt 176.0 lb

## 2023-03-04 DIAGNOSIS — Z0000 Encounter for general adult medical examination without abnormal findings: Secondary | ICD-10-CM

## 2023-03-04 NOTE — Progress Notes (Signed)
Subjective:   Edwin [name] is a 82 y.o. male who presents for Medicare Annual/Subsequent preventive examination.  Visit Complete: Virtual  I connected with  [name] on 03/04/23 by a audio enabled telemedicine application and verified that I am speaking with the correct person using two identifiers.  Patient Location: Home  Provider Location: Office/Clinic  I discussed the limitations of evaluation and management by telemedicine. The patient expressed understanding and agreed to proceed.   Review of Systems     Cardiac Risk Factors include: advanced age (>61men, >61 women);dyslipidemia;hypertension;male gender;Other (see comment), Risk factor comments: AFib     Objective:    Today's Vitals   03/04/23 0821  Weight: 176 lb (79.8 kg)  Height: 5\' 8"  (1.727 m)   Body mass index is 26.76 kg/m.     03/04/2023    8:43 AM 02/19/2022   12:40 PM 02/14/2021   10:03 AM 12/31/2020    5:51 AM 02/14/2020   10:53 AM 08/12/2019   12:34 PM 07/19/2019    1:20 AM  Advanced Directives  Does Patient Have a Medical Advance Directive? Yes Yes Yes Yes No Yes Yes  Type of Estate agent of [place];Living will Healthcare Power of [employer] of [place];Living will Living will  Healthcare Power of [employer] Power of Attorney  Does patient want to make changes to medical advance directive?  No - Patient declined No - Patient declined    No - Patient declined  Copy of Healthcare Power of Attorney in Chart? No - copy requested No - copy requested No - copy requested    No - copy requested  Would patient like information on creating a medical advance directive?     No - Patient declined      Current Medications (verified) Outpatient Encounter Medications as of 03/04/2023  Medication Sig   apixaban (ELIQUIS) 5 MG TABS tablet Take 1 tablet (5 mg total) by mouth 2 (two) times daily.   aspirin 81 MG tablet Take 81 mg by mouth at bedtime.    azelastine (ASTELIN) 0.1 % nasal spray Place 1 spray into both nostrils 2 (two) times daily.   Cholecalciferol (VITAMIN D3) 2000 UNITS TABS Take 2,000 Units by mouth daily.    fluticasone (FLONASE) 50 MCG/ACT nasal spray USE 2 SPRAYS IN EACH NOSTRIL EVERY DAY   ketoconazole (NIZORAL) 2 % shampoo Apply topically. 2-3 times weekly   latanoprost (XALATAN) 0.005 % ophthalmic solution Place 1 drop into both eyes at bedtime.   levETIRAcetam (KEPPRA XR) 500 MG 24 hr tablet Take 1,000 mg by mouth daily.   levocetirizine (XYZAL) 5 MG tablet TAKE 1 TABLET EVERY EVENING   MAGNESIUM GLYCINATE PO Take 240 mg by mouth daily.   metoprolol succinate (TOPROL XL) 25 MG 24 hr tablet Take 1 tablet (25 mg total) by mouth daily.   Multiple Vitamin (MULTIVITAMIN) tablet Take 1 tablet by mouth daily.   Omega-3 Fatty Acids (FISH OIL) 1200 MG CAPS Take 1,200 mg by mouth daily.   omeprazole (PRILOSEC) 20 MG capsule TAKE 1 CAPSULE TWICE DAILY BEFORE MEALS   polyethylene glycol (MIRALAX / GLYCOLAX) 17 g packet Take 17 g by mouth daily.   Psyllium 100 % POWD 1 TSP Daily   rosuvastatin (CRESTOR) 40 MG tablet Take 1 tablet (40 mg total) by mouth daily.   Saw Palmetto 450 MG CAPS Take 450 mg by mouth daily.    Zinc 50 MG TABS Take 50 mg by mouth daily.  triamcinolone (KENALOG) 0.1 % Apply 1 application topically 2 (two) times daily. (Patient not taking: Reported on 03/04/2023)   [DISCONTINUED] mupirocin ointment (BACTROBAN) 2 % Apply 1 application topically 2 (two) times daily. Apply in the nose two times per day. (Patient not taking: Reported on 03/04/2023)   No facility-administered encounter medications on file as of 03/04/2023.    Allergies (verified) Ketamine, Penicillins, and Sulfa antibiotics   History: Past Medical History:  Diagnosis Date   Allergy    Arthritis    Coronary artery disease    a. 12/2013 PCI: LM nl, LAD 50p, 12m (3.0x28 Alpine Xience DES), D1 90, LCX nl, OM1 70, RCA nl; b. 12/2022 MV: apical-mid  inflat ischemia, mildly reduced EF.   Diverticulosis of colon    GERD (gastroesophageal reflux disease)    ESOPHAGEAL REFLUX   Glaucoma    Gout    Hemorrhoids    History of kidney stones    Hx of colonic polyp    Hypercholesterolemia    Hypertension    LVH (left ventricular hypertrophy)    a. 12/2022 Echo: EF 60-65%, no rwma, mod conc LVH. Nl RV fxn. RVSP . Mild to mod LAE. Mild MR/AI.   Schatzki's ring    Seizures (HCC)    Past Surgical History:  Procedure Laterality Date   CARDIAC CATHETERIZATION     COLONOSCOPY WITH PROPOFOL N/A 05/18/2017   Procedure: COLONOSCOPY WITH PROPOFOL;  Surgeon: Scot Jun, MD;  Location: [facility];  Service: Endoscopy;  Laterality: N/A;   CORONARY ANGIOPLASTY     EYE SURGERY  07/20/13   cataract   HERNIA REPAIR Right 2008 & 2012   VASECTOMY  1984   Family History  Problem Relation Age of Onset   Arthritis Mother    Heart disease Mother    Stroke Mother    Hypertension Mother    Arthritis Father    Hyperlipidemia Father    Heart disease Father    Hypertension Father    Parkinson's disease Father    Multiple sclerosis Sister    Colon cancer Maternal Grandmother    Arthritis Paternal Grandmother    Colon cancer Paternal Grandmother    Social History   Socioeconomic History   Marital status: Married    Spouse name: Not on file   Number of children: Not on file   Years of education: Not on file   Highest education level: Not on file  Occupational History   Not on file  Tobacco Use   Smoking status: Never   Smokeless tobacco: Never  Vaping Use   Vaping Use: Never used  Substance and Sexual Activity   Alcohol use: No    Alcohol/week: 0.0 standard drinks of alcohol   Drug use: No   Sexual activity: Never  Other Topics Concern   Not on file  Social History Narrative   Not on file   Social Determinants of Health   Financial Resource Strain: Low Risk  (03/04/2023)   Overall Financial Resource Strain (CARDIA)     Difficulty of Paying Living Expenses: Not hard at all  Food Insecurity: No Food Insecurity (03/04/2023)   Hunger Vital Sign    Worried About Running Out of Food in the Last Year: Never true    Ran Out of Food in the Last Year: Never true  Transportation Needs: No Transportation Needs (03/04/2023)   PRAPARE - Administrator, Civil Service (Medical): No    Lack of Transportation (Non-Medical): No  Physical Activity: Sufficiently  Active (03/04/2023)   Exercise Vital Sign    Days of Exercise per Week: 5 days    Minutes of Exercise per Session: 30 min  Stress: No Stress Concern Present (03/04/2023)   [employer] of Occupational Health - Occupational Stress Questionnaire    Feeling of Stress : Not at all  Social Connections: Moderately Integrated (03/04/2023)   Social Connection and Isolation Panel [NHANES]    Frequency of Communication with Friends and Family: More than three times a week    Frequency of Social Gatherings with Friends and Family: More than three times a week    Attends Religious Services: 1 to 4 times per year    Active Member of [employer] or Organizations: No    Attends Engineer, structural: Never    Marital Status: Married    Tobacco Counseling Counseling given: Not Answered   Clinical Intake:  Pre-visit preparation completed: Yes  Pain : No/denies pain     BMI - recorded: 26.76 Nutritional Risks: None Diabetes: No  How often do you need to have someone help you when you read instructions, pamphlets, or other written materials from your doctor or pharmacy?: 1 - Never  Interpreter Needed?: No  Information entered by :: R. Thanvi Blincoe LPN   Activities of Daily Living    03/04/2023    8:24 AM  In your present state of health, do you have any difficulty performing the following activities:  Hearing? 1  Comment a little problem with left ear  Vision? 0  Difficulty concentrating or making decisions? 1  Comment at times  Walking or climbing stairs?  0  Dressing or bathing? 0  Doing errands, shopping? 0  Preparing Food and eating ? N  Using the Toilet? N  In the past six months, have you accidently leaked urine? N  Do you have problems with loss of bowel control? N  Managing your Medications? N  Managing your Finances? N  Housekeeping or managing your Housekeeping? N    Patient Care Team: Dale , MD as PCP - General (Internal Medicine) Antonieta Iba, MD as PCP - Cardiology (Cardiology)  Indicate any recent Medical Services you may have received from other than [facility] providers in the past year (date may be approximate).     Assessment:   This is a routine wellness examination for Antwaine.  Hearing/Vision screen Hearing Screening - Comments:: Little problem left ear Vision Screening - Comments:: No issues  Dietary issues and exercise activities discussed:     Goals Addressed             This Visit's Progress    Patient Stated       Wants to try to walk more      Depression Screen    03/04/2023    8:36 AM 01/23/2023    9:09 AM 09/24/2022    8:06 AM 05/23/2022    8:24 AM 02/19/2022   12:45 PM 01/14/2022    9:58 AM 09/16/2021    9:10 AM  PHQ 2/9 Scores  PHQ - 2 Score 0 0 0 0 0 0 0  PHQ- 9 Score 2          Fall Risk    03/04/2023    8:45 AM 01/23/2023    9:09 AM 09/24/2022    8:05 AM 05/23/2022    8:23 AM 02/19/2022   12:51 PM  Fall Risk   Falls in the past year? 0 0 0 0 0  Number falls in past yr:  0 0 0    Injury with Fall? 0 0 0    Risk for fall due to : No Fall Risks No Fall Risks No Fall Risks No Fall Risks   Follow up Falls prevention discussed;Falls evaluation completed;Education provided Falls evaluation completed Falls evaluation completed Falls evaluation completed Falls evaluation completed    MEDICARE RISK AT HOME:  Medicare Risk at Home - 03/04/23 0845     Any stairs in or around the home? Yes    If so, are there any without handrails? Yes    Home free of loose throw rugs in walkways,  pet beds, electrical cords, etc? Yes    Adequate lighting in your home to reduce risk of falls? Yes    Life alert? No    Use of a cane, walker or w/c? No    Grab bars in the bathroom? No    Shower chair or bench in shower? No    Elevated toilet seat or a handicapped toilet? No             T Cognitive Function:    10/14/2017   10:51 AM 10/12/2015    9:52 AM  MMSE - Mini Mental State Exam  Orientation to time 5 5  Orientation to Place 5 5  Registration 3 3  Attention/ Calculation 5 5  Recall 2 3  Language- name 2 objects 2 2  Language- repeat 1 1  Language- follow 3 step command 3 3  Language- read & follow direction 1 1  Write a sentence 1 1  Copy design 1 1  Total score 29 30        02/14/2020   10:56 AM 10/13/2016   10:47 AM  6CIT Screen  What Year?  0 points  What month?  0 points  What time?  0 points  Count back from 20  0 points  Months in reverse 0 points 0 points  Repeat phrase  0 points  Total Score  0 points    Immunizations Immunization History  Administered Date(s) Administered   Fluad Quad(high Dose 65+) 06/29/2019, 06/04/2020, 05/15/2021, 05/23/2022   Influenza, High Dose Seasonal PF 06/12/2016, 06/17/2017, 06/23/2018   Influenza,inj,Quad PF,6+ Mos 07/29/2013, 06/20/2014, 07/18/2015   PFIZER(Purple Top)SARS-COV-2 Vaccination 09/19/2019, 10/10/2019   Pneumococcal Conjugate-13 08/09/2013   Pneumococcal Polysaccharide-23 07/18/2015   Tdap 09/01/2004, 07/05/2014   Zoster Recombinant(Shingrix) 06/14/2019, 12/09/2019   Zoster, Live 08/29/2013    TDAP status: Up to date  Flu Vaccine status: Up to date  Pneumococcal vaccine status: Up to date  Covid-19 vaccine status: Information provided on how to obtain vaccines.   Qualifies for Shingles Vaccine? Yes   Zostavax completed Yes   Shingrix Completed?: Yes  Screening Tests Health Maintenance  Topic Date Due   COVID-19 Vaccine (3 - 2023-24 season) 05/02/2022   INFLUENZA VACCINE  04/02/2023    Medicare Annual Wellness (AWV)  03/03/2024   DTaP/Tdap/Td (3 - Td or Tdap) 07/05/2024   Pneumonia Vaccine 66+ Years old  Completed   Zoster Vaccines- Shingrix  Completed   HPV VACCINES  Aged Out   Hepatitis C Screening  Discontinued    Health Maintenance  Health Maintenance Due  Topic Date Due   COVID-19 Vaccine (3 - 2023-24 season) 05/02/2022    Colorectal cancer screening: No longer required.   Lung Cancer Screening: (Low Dose CT Chest recommended if Age 65-80 years, 20 pack-year currently smoking OR have quit w/in 15years.) does not qualify.     Additional Screening:  Hepatitis C Screening: does not qualify; Completed 01/10/21  Vision Screening: Recommended annual ophthalmology exams for early detection of glaucoma and other disorders of the eye. Is the patient up to date with their annual eye exam?  Yes  Who is the provider or what is the name of the office in which the patient attends annual eye exams? [facility]  If pt is not established with a provider, would they like to be referred to a provider to establish care? No .   Dental Screening: Recommended annual dental exams for proper oral hygiene   Community Resource Referral / Chronic Care Management: CRR required this visit?  No   CCM required this visit?  No     Plan:     I have personally reviewed and noted the following in the patient's chart:   Medical and social history Use of alcohol, tobacco or illicit drugs  Current medications and supplements including opioid prescriptions. Patient is not currently taking opioid prescriptions. Functional ability and status Nutritional status Physical activity Advanced directives List of other physicians Hospitalizations, surgeries, and ER visits in previous 12 months Vitals Screenings to include cognitive, depression, and falls Referrals and appointments  In addition, I have reviewed and discussed with patient certain preventive protocols, quality  metrics, and best practice recommendations. A written personalized care plan for preventive services as well as general preventive health recommendations were provided to patient.     Sydell Axon, LPN   05/08/2951   After Visit Summary: (MyChart) Due to this being a telephonic visit, the after visit summary with patients personalized plan was offered to patient via MyChart   Nurse Notes: None

## 2023-03-04 NOTE — Patient Instructions (Signed)
Edwin [name] , Thank you for taking time to come for your Medicare Wellness Visit. I appreciate your ongoing commitment to your health goals. Please review the following plan we discussed and let me know if I can assist you in the future.   These are the goals we discussed:  Goals      Maintain healthy lifestyle     Stay active Healthy diet     Patient Stated     Wants to try to walk more        This is a list of the screening recommended for you and due dates:  Health Maintenance  Topic Date Due   COVID-19 Vaccine (3 - 2023-24 season) 05/02/2022   Flu Shot  04/02/2023   Medicare Annual Wellness Visit  03/03/2024   DTaP/Tdap/Td vaccine (3 - Td or Tdap) 07/05/2024   Pneumonia Vaccine  Completed   Zoster (Shingles) Vaccine  Completed   HPV Vaccine  Aged Out   Hepatitis C Screening  Discontinued    Advanced directives: Will bring to the office  Conditions/risks identified: none  Next appointment: Follow up in one year for your annual wellness visit. 03/07/24 @ 10:45  Preventive Care 65 Years and Older, Male  Preventive care refers to lifestyle choices and visits with your health care provider that can promote health and wellness. What does preventive care include? A yearly physical exam. This is also called an annual well check. Dental exams once or twice a year. Routine eye exams. Ask your health care provider how often you should have your eyes checked. Personal lifestyle choices, including: Daily care of your teeth and gums. Regular physical activity. Eating a healthy diet. Avoiding tobacco and drug use. Limiting alcohol use. Practicing safe sex. Taking low doses of aspirin every day. Taking vitamin and mineral supplements as recommended by your health care provider. What happens during an annual well check? The services and screenings done by your health care provider during your annual well check will depend on your age, overall health, lifestyle risk factors, and  family history of disease. Counseling  Your health care provider may ask you questions about your: Alcohol use. Tobacco use. Drug use. Emotional well-being. Home and relationship well-being. Sexual activity. Eating habits. History of falls. Memory and ability to understand (cognition). Work and work Astronomer. Screening  You may have the following tests or measurements: Height, weight, and BMI. Blood pressure. Lipid and cholesterol levels. These may be checked every 5 years, or more frequently if you are over 51 years old. Skin check. Lung cancer screening. You may have this screening every year starting at age 17 if you have a 30-pack-year history of smoking and currently smoke or have quit within the past 15 years. Fecal occult blood test (FOBT) of the stool. You may have this test every year starting at age 62. Flexible sigmoidoscopy or colonoscopy. You may have a sigmoidoscopy every 5 years or a colonoscopy every 10 years starting at age 28. Prostate cancer screening. Recommendations will vary depending on your family history and other risks. Hepatitis C blood test. Hepatitis B blood test. Sexually transmitted disease (STD) testing. Diabetes screening. This is done by checking your blood sugar (glucose) after you have not eaten for a while (fasting). You may have this done every 1-3 years. Abdominal aortic aneurysm (AAA) screening. You may need this if you are a current or former smoker. Osteoporosis. You may be screened starting at age 28 if you are at high risk. Talk with your  health care provider about your test results, treatment options, and if necessary, the need for more tests. Vaccines  Your health care provider may recommend certain vaccines, such as: Influenza vaccine. This is recommended every year. Tetanus, diphtheria, and acellular pertussis (Tdap, Td) vaccine. You may need a Td booster every 10 years. Zoster vaccine. You may need this after age 66. Pneumococcal  13-valent conjugate (PCV13) vaccine. One dose is recommended after age 16. Pneumococcal polysaccharide (PPSV23) vaccine. One dose is recommended after age 56. Talk to your health care provider about which screenings and vaccines you need and how often you need them. This information is not intended to replace advice given to you by your health care provider. Make sure you discuss any questions you have with your health care provider. Document Released: 09/14/2015 Document Revised: 05/07/2016 Document Reviewed: 06/19/2015 Elsevier Interactive Patient Education  2017 [place] Prevention in the Home Falls can cause injuries. They can happen to people of all ages. There are many things you can do to make your home safe and to help prevent falls. What can I do on the outside of my home? Regularly fix the edges of walkways and driveways and fix any cracks. Remove anything that might make you trip as you walk through a door, such as a raised step or threshold. Trim any bushes or trees on the path to your home. Use bright outdoor lighting. Clear any walking paths of anything that might make someone trip, such as rocks or tools. Regularly check to see if handrails are loose or broken. Make sure that both sides of any steps have handrails. Any raised decks and porches should have guardrails on the edges. Have any leaves, snow, or ice cleared regularly. Use sand or salt on walking paths during winter. Clean up any spills in your garage right away. This includes oil or grease spills. What can I do in the bathroom? Use night lights. Install grab bars by the toilet and in the tub and shower. Do not use towel bars as grab bars. Use non-skid mats or decals in the tub or shower. If you need to sit down in the shower, use a plastic, non-slip stool. Keep the floor dry. Clean up any water that spills on the floor as soon as it happens. Remove soap buildup in the tub or shower regularly. Attach  bath mats securely with double-sided non-slip rug tape. Do not have throw rugs and other things on the floor that can make you trip. What can I do in the bedroom? Use night lights. Make sure that you have a light by your bed that is easy to reach. Do not use any sheets or blankets that are too big for your bed. They should not hang down onto the floor. Have a firm chair that has side arms. You can use this for support while you get dressed. Do not have throw rugs and other things on the floor that can make you trip. What can I do in the kitchen? Clean up any spills right away. Avoid walking on wet floors. Keep items that you use a lot in easy-to-reach places. If you need to reach something above you, use a strong step stool that has a grab bar. Keep electrical cords out of the way. Do not use floor polish or wax that makes floors slippery. If you must use wax, use non-skid floor wax. Do not have throw rugs and other things on the floor that can make you trip. What  can I do with my stairs? Do not leave any items on the stairs. Make sure that there are handrails on both sides of the stairs and use them. Fix handrails that are broken or loose. Make sure that handrails are as long as the stairways. Check any carpeting to make sure that it is firmly attached to the stairs. Fix any carpet that is loose or worn. Avoid having throw rugs at the top or bottom of the stairs. If you do have throw rugs, attach them to the floor with carpet tape. Make sure that you have a light switch at the top of the stairs and the bottom of the stairs. If you do not have them, ask someone to add them for you. What else can I do to help prevent falls? Wear shoes that: Do not have high heels. Have rubber bottoms. Are comfortable and fit you well. Are closed at the toe. Do not wear sandals. If you use a stepladder: Make sure that it is fully opened. Do not climb a closed stepladder. Make sure that both sides of the  stepladder are locked into place. Ask someone to hold it for you, if possible. Clearly mark and make sure that you can see: Any grab bars or handrails. First and last steps. Where the edge of each step is. Use tools that help you move around (mobility aids) if they are needed. These include: Canes. Walkers. Scooters. Crutches. Turn on the lights when you go into a dark area. Replace any light bulbs as soon as they burn out. Set up your furniture so you have a clear path. Avoid moving your furniture around. If any of your floors are uneven, fix them. If there are any pets around you, be aware of where they are. Review your medicines with your doctor. Some medicines can make you feel dizzy. This can increase your chance of falling. Ask your doctor what other things that you can do to help prevent falls. This information is not intended to replace advice given to you by your health care provider. Make sure you discuss any questions you have with your health care provider. Document Released: 06/14/2009 Document Revised: 01/24/2016 Document Reviewed: 09/22/2014 Elsevier Interactive Patient Education  2017 [employer].

## 2023-03-06 NOTE — Progress Notes (Addendum)
Subjective:   Edwin [name] is a 82 y.o. male who presents for Medicare Annual/Subsequent preventive examination.  Visit Complete: Virtual  I connected with  Edwin [name] on 03/04/23 by a audio enabled telemedicine application and verified that I am speaking with the correct person using two identifiers.  Patient Location: Home  Provider Location: Office/Clinic  I discussed the limitations of evaluation and management by telemedicine. The patient expressed understanding and agreed to proceed.   Review of Systems     Cardiac Risk Factors include: advanced age (>42men, >35 women);dyslipidemia;hypertension;male gender;Other (see comment), Risk factor comments: AFib     Objective:    Today's Vitals   03/04/23 0821  Weight: 176 lb (79.8 kg)  Height: 5\' 8"  (1.727 m)   Body mass index is 26.76 kg/m.     03/04/2023    8:43 AM 02/19/2022   12:40 PM 02/14/2021   10:03 AM 12/31/2020    5:51 AM 02/14/2020   10:53 AM 08/12/2019   12:34 PM 07/19/2019    1:20 AM  Advanced Directives  Does Patient Have a Medical Advance Directive? Yes Yes Yes Yes No Yes Yes  Type of Estate agent of [place];Living will Healthcare Power of [employer] of [place];Living will Living will  Healthcare Power of [employer] Power of Attorney  Does patient want to make changes to medical advance directive?  No - Patient declined No - Patient declined    No - Patient declined  Copy of Healthcare Power of Attorney in Chart? No - copy requested No - copy requested No - copy requested    No - copy requested  Would patient like information on creating a medical advance directive?     No - Patient declined      Current Medications (verified) Outpatient Encounter Medications as of 03/04/2023  Medication Sig   apixaban (ELIQUIS) 5 MG TABS tablet Take 1 tablet (5 mg total) by mouth 2 (two) times daily.   aspirin 81 MG tablet Take 81 mg by mouth at bedtime.   azelastine  (ASTELIN) 0.1 % nasal spray Place 1 spray into both nostrils 2 (two) times daily.   Cholecalciferol (VITAMIN D3) 2000 UNITS TABS Take 2,000 Units by mouth daily.    fluticasone (FLONASE) 50 MCG/ACT nasal spray USE 2 SPRAYS IN EACH NOSTRIL EVERY DAY   ketoconazole (NIZORAL) 2 % shampoo Apply topically. 2-3 times weekly   latanoprost (XALATAN) 0.005 % ophthalmic solution Place 1 drop into both eyes at bedtime.   levETIRAcetam (KEPPRA XR) 500 MG 24 hr tablet Take 1,000 mg by mouth daily.   levocetirizine (XYZAL) 5 MG tablet TAKE 1 TABLET EVERY EVENING   MAGNESIUM GLYCINATE PO Take 240 mg by mouth daily.   metoprolol succinate (TOPROL XL) 25 MG 24 hr tablet Take 1 tablet (25 mg total) by mouth daily.   Multiple Vitamin (MULTIVITAMIN) tablet Take 1 tablet by mouth daily.   Omega-3 Fatty Acids (FISH OIL) 1200 MG CAPS Take 1,200 mg by mouth daily.   omeprazole (PRILOSEC) 20 MG capsule TAKE 1 CAPSULE TWICE DAILY BEFORE MEALS   polyethylene glycol (MIRALAX / GLYCOLAX) 17 g packet Take 17 g by mouth daily.   Psyllium 100 % POWD 1 TSP Daily   rosuvastatin (CRESTOR) 40 MG tablet Take 1 tablet (40 mg total) by mouth daily.   Saw Palmetto 450 MG CAPS Take 450 mg by mouth daily.    Zinc 50 MG TABS Take 50 mg by mouth daily.  triamcinolone (KENALOG) 0.1 % Apply 1 application topically 2 (two) times daily. (Patient not taking: Reported on 03/04/2023)   [DISCONTINUED] mupirocin ointment (BACTROBAN) 2 % Apply 1 application topically 2 (two) times daily. Apply in the nose two times per day. (Patient not taking: Reported on 03/04/2023)   No facility-administered encounter medications on file as of 03/04/2023.    Allergies (verified) Ketamine, Penicillins, and Sulfa antibiotics   History: Past Medical History:  Diagnosis Date   Allergy    Arthritis    Coronary artery disease    a. 12/2013 PCI: LM nl, LAD 50p, 106m (3.0x28 Alpine Xience DES), D1 90, LCX nl, OM1 70, RCA nl; b. 12/2022 MV: apical-mid inflat ischemia,  mildly reduced EF.   Diverticulosis of colon    GERD (gastroesophageal reflux disease)    ESOPHAGEAL REFLUX   Glaucoma    Gout    Hemorrhoids    History of kidney stones    Hx of colonic polyp    Hypercholesterolemia    Hypertension    LVH (left ventricular hypertrophy)    a. 12/2022 Echo: EF 60-65%, no rwma, mod conc LVH. Nl RV fxn. RVSP . Mild to mod LAE. Mild MR/AI.   Schatzki's ring    Seizures (HCC)    Past Surgical History:  Procedure Laterality Date   CARDIAC CATHETERIZATION     COLONOSCOPY WITH PROPOFOL N/A 05/18/2017   Procedure: COLONOSCOPY WITH PROPOFOL;  Surgeon: Scot Jun, MD;  Location: [facility] ENDOSCOPY;  Service: Endoscopy;  Laterality: N/A;   CORONARY ANGIOPLASTY     EYE SURGERY  07/20/13   cataract   HERNIA REPAIR Right 2008 & 2012   VASECTOMY  1984   Family History  Problem Relation Age of Onset   Arthritis Mother    Heart disease Mother    Stroke Mother    Hypertension Mother    Arthritis Father    Hyperlipidemia Father    Heart disease Father    Hypertension Father    Parkinson's disease Father    Multiple sclerosis Sister    Colon cancer Maternal Grandmother    Arthritis Paternal Grandmother    Colon cancer Paternal Grandmother    Social History   Socioeconomic History   Marital status: Married    Spouse name: Not on file   Number of children: Not on file   Years of education: Not on file   Highest education level: Not on file  Occupational History   Not on file  Tobacco Use   Smoking status: Never   Smokeless tobacco: Never  Vaping Use   Vaping Use: Never used  Substance and Sexual Activity   Alcohol use: No    Alcohol/week: 0.0 standard drinks of alcohol   Drug use: No   Sexual activity: Never  Other Topics Concern   Not on file  Social History Narrative   Not on file   Social Determinants of Health   Financial Resource Strain: Low Risk  (03/04/2023)   Overall Financial Resource Strain (CARDIA)    Difficulty of  Paying Living Expenses: Not hard at all  Food Insecurity: No Food Insecurity (03/04/2023)   Hunger Vital Sign    Worried About Running Out of Food in the Last Year: Never true    Ran Out of Food in the Last Year: Never true  Transportation Needs: No Transportation Needs (03/04/2023)   PRAPARE - Administrator, Civil Service (Medical): No    Lack of Transportation (Non-Medical): No  Physical Activity: Sufficiently  Active (03/04/2023)   Exercise Vital Sign    Days of Exercise per Week: 5 days    Minutes of Exercise per Session: 30 min  Stress: No Stress Concern Present (03/04/2023)   [employer] of Occupational Health - Occupational Stress Questionnaire    Feeling of Stress : Not at all  Social Connections: Moderately Integrated (03/04/2023)   Social Connection and Isolation Panel [NHANES]    Frequency of Communication with Friends and Family: More than three times a week    Frequency of Social Gatherings with Friends and Family: More than three times a week    Attends Religious Services: 1 to 4 times per year    Active Member of [employer] or Organizations: No    Attends Engineer, structural: Never    Marital Status: Married    Tobacco Counseling Counseling given: Not Answered   Clinical Intake:  Pre-visit preparation completed: Yes  Pain : No/denies pain     BMI - recorded: 26.76 Nutritional Risks: None Diabetes: No  How often do you need to have someone help you when you read instructions, pamphlets, or other written materials from your doctor or pharmacy?: 1 - Never  Interpreter Needed?: No  Information entered by :: R. Hikari Tripp LPN   Activities of Daily Living    03/04/2023    8:24 AM  In your present state of health, do you have any difficulty performing the following activities:  Hearing? 1  Comment a little problem with left ear  Vision? 0  Difficulty concentrating or making decisions? 1  Comment at times  Walking or climbing stairs? 0  Dressing  or bathing? 0  Doing errands, shopping? 0  Preparing Food and eating ? N  Using the Toilet? N  In the past six months, have you accidently leaked urine? N  Do you have problems with loss of bowel control? N  Managing your Medications? N  Managing your Finances? N  Housekeeping or managing your Housekeeping? N    Patient Care Team: Dale Cumberland, MD as PCP - General (Internal Medicine) Antonieta Iba, MD as PCP - Cardiology (Cardiology)  Indicate any recent Medical Services you may have received from other than [facility] providers in the past year (date may be approximate).     Assessment:   This is a routine wellness examination for Diangelo.  Hearing/Vision screen Hearing Screening - Comments:: Little problem left ear Vision Screening - Comments:: No issues  Dietary issues and exercise activities discussed:     Goals Addressed             This Visit's Progress    Patient Stated       Wants to try to walk more      Depression Screen    03/04/2023    8:36 AM 01/23/2023    9:09 AM 09/24/2022    8:06 AM 05/23/2022    8:24 AM 02/19/2022   12:45 PM 01/14/2022    9:58 AM 09/16/2021    9:10 AM  PHQ 2/9 Scores  PHQ - 2 Score 0 0 0 0 0 0 0  PHQ- 9 Score 2          Fall Risk    03/04/2023    8:45 AM 01/23/2023    9:09 AM 09/24/2022    8:05 AM 05/23/2022    8:23 AM 02/19/2022   12:51 PM  Fall Risk   Falls in the past year? 0 0 0 0 0  Number falls in past yr:  0 0 0    Injury with Fall? 0 0 0    Risk for fall due to : No Fall Risks No Fall Risks No Fall Risks No Fall Risks   Follow up Falls prevention discussed;Falls evaluation completed;Education provided Falls evaluation completed Falls evaluation completed Falls evaluation completed Falls evaluation completed    MEDICARE RISK AT HOME:    T Cognitive Function:    10/14/2017   10:51 AM 10/12/2015    9:52 AM  MMSE - Mini Mental State Exam  Orientation to time 5 5  Orientation to Place 5 5  Registration 3 3   Attention/ Calculation 5 5  Recall 2 3  Language- name 2 objects 2 2  Language- repeat 1 1  Language- follow 3 step command 3 3  Language- read & follow direction 1 1  Write a sentence 1 1  Copy design 1 1  Total score 29 30        03/06/2023    7:56 AM 03/04/2023    9:01 AM 02/14/2020   10:56 AM 10/13/2016   10:47 AM  6CIT Screen  What Year? 0 points 0 points  0 points  What month? 0 points 0 points  0 points  What time? 0 points 0 points  0 points  Count back from 20 0 points 0 points  0 points  Months in reverse 0 points 0 points 0 points 0 points  Repeat phrase 0 points 0 points  0 points  Total Score 0 points 0 points  0 points    Immunizations Immunization History  Administered Date(s) Administered   Fluad Quad(high Dose 65+) 06/29/2019, 06/04/2020, 05/15/2021, 05/23/2022   Influenza, High Dose Seasonal PF 06/12/2016, 06/17/2017, 06/23/2018   Influenza,inj,Quad PF,6+ Mos 07/29/2013, 06/20/2014, 07/18/2015   PFIZER(Purple Top)SARS-COV-2 Vaccination 09/19/2019, 10/10/2019   Pneumococcal Conjugate-13 08/09/2013   Pneumococcal Polysaccharide-23 07/18/2015   Tdap 09/01/2004, 07/05/2014   Zoster Recombinant(Shingrix) 06/14/2019, 12/09/2019   Zoster, Live 08/29/2013    TDAP status: Up to date  Flu Vaccine status: Up to date  Pneumococcal vaccine status: Up to date  Covid-19 vaccine status: Information provided on how to obtain vaccines.   Qualifies for Shingles Vaccine? Yes   Zostavax completed Yes   Shingrix Completed?: Yes  Screening Tests Health Maintenance  Topic Date Due   COVID-19 Vaccine (3 - 2023-24 season) 05/02/2022   INFLUENZA VACCINE  04/02/2023   Medicare Annual Wellness (AWV)  03/03/2024   DTaP/Tdap/Td (3 - Td or Tdap) 07/05/2024   Pneumonia Vaccine 4+ Years old  Completed   Zoster Vaccines- Shingrix  Completed   HPV VACCINES  Aged Out   Hepatitis C Screening  Discontinued    Health Maintenance  Health Maintenance Due  Topic Date Due    COVID-19 Vaccine (3 - 2023-24 season) 05/02/2022    Colorectal cancer screening: No longer required.   Lung Cancer Screening: (Low Dose CT Chest recommended if Age 85-80 years, 20 pack-year currently smoking OR have quit w/in 15years.) does not qualify.     Additional Screening:  Hepatitis C Screening: does not qualify; Completed 01/10/21  Vision Screening: Recommended annual ophthalmology exams for early detection of glaucoma and other disorders of the eye. Is the patient up to date with their annual eye exam?  Yes  Who is the provider or what is the name of the office in which the patient attends annual eye exams? [facility]  If pt is not established with a provider, would they like to be referred to  a provider to establish care? No .   Dental Screening: Recommended annual dental exams for proper oral hygiene   Community Resource Referral / Chronic Care Management: CRR required this visit?  No   CCM required this visit?  No     Plan:     I have personally reviewed and noted the following in the patient's chart:   Medical and social history Use of alcohol, tobacco or illicit drugs  Current medications and supplements including opioid prescriptions. Patient is not currently taking opioid prescriptions. Functional ability and status Nutritional status Physical activity Advanced directives List of other physicians Hospitalizations, surgeries, and ER visits in previous 12 months Vitals Screenings to include cognitive, depression, and falls Referrals and appointments  In addition, I have reviewed and discussed with patient certain preventive protocols, quality metrics, and best practice recommendations. A written personalized care plan for preventive services as well as general preventive health recommendations were provided to patient.     Sydell Axon, LPN   09/06/08   After Visit Summary: (MyChart) Due to this being a telephonic visit, the after visit summary with  patients personalized plan was offered to patient via MyChart   Nurse Notes: None

## 2023-05-19 DIAGNOSIS — D485 Neoplasm of uncertain behavior of skin: Secondary | ICD-10-CM | POA: Diagnosis not present

## 2023-05-19 DIAGNOSIS — L820 Inflamed seborrheic keratosis: Secondary | ICD-10-CM | POA: Diagnosis not present

## 2023-06-01 ENCOUNTER — Other Ambulatory Visit (INDEPENDENT_AMBULATORY_CARE_PROVIDER_SITE_OTHER): Payer: Medicare HMO

## 2023-06-01 DIAGNOSIS — I10 Essential (primary) hypertension: Secondary | ICD-10-CM

## 2023-06-01 DIAGNOSIS — E7800 Pure hypercholesterolemia, unspecified: Secondary | ICD-10-CM

## 2023-06-01 LAB — BASIC METABOLIC PANEL
BUN: 15 mg/dL (ref 6–23)
CO2: 24 meq/L (ref 19–32)
Calcium: 9.4 mg/dL (ref 8.4–10.5)
Chloride: 105 meq/L (ref 96–112)
Creatinine, Ser: 1.12 mg/dL (ref 0.40–1.50)
GFR: 61.41 mL/min (ref >60.00)
Glucose, Bld: 106 mg/dL — ABNORMAL HIGH (ref 70–99)
Potassium: 4.1 meq/L (ref 3.5–5.1)
Sodium: 138 meq/L (ref 135–145)

## 2023-06-01 LAB — HEPATIC FUNCTION PANEL
ALT: 28 U/L (ref 0–53)
AST: 27 U/L (ref 0–37)
Albumin: 4 g/dL (ref 3.5–5.2)
Alkaline Phosphatase: 58 U/L (ref 39–117)
Bilirubin, Direct: 0.2 mg/dL (ref 0.0–0.3)
Total Bilirubin: 1 mg/dL (ref 0.2–1.2)
Total Protein: 7.2 g/dL (ref 6.0–8.3)

## 2023-06-01 LAB — LIPID PANEL
Cholesterol: 116 mg/dL (ref 0–200)
HDL: 32.7 mg/dL — ABNORMAL LOW (ref >39.00)
LDL Cholesterol: 49 mg/dL (ref 0–99)
NonHDL: 83.73
Total CHOL/HDL Ratio: 4
Triglycerides: 176 mg/dL — ABNORMAL HIGH (ref 0.0–149.0)
VLDL: 35.2 mg/dL (ref 0.0–40.0)

## 2023-06-04 ENCOUNTER — Ambulatory Visit (INDEPENDENT_AMBULATORY_CARE_PROVIDER_SITE_OTHER): Payer: Medicare HMO | Admitting: Internal Medicine

## 2023-06-04 ENCOUNTER — Encounter: Payer: Self-pay | Admitting: Internal Medicine

## 2023-06-04 VITALS — BP 112/70 | HR 72 | Temp 98.0°F | Resp 16 | Ht 68.0 in | Wt 175.2 lb

## 2023-06-04 DIAGNOSIS — K219 Gastro-esophageal reflux disease without esophagitis: Secondary | ICD-10-CM | POA: Diagnosis not present

## 2023-06-04 DIAGNOSIS — I25119 Atherosclerotic heart disease of native coronary artery with unspecified angina pectoris: Secondary | ICD-10-CM | POA: Diagnosis not present

## 2023-06-04 DIAGNOSIS — I10 Essential (primary) hypertension: Secondary | ICD-10-CM | POA: Diagnosis not present

## 2023-06-04 DIAGNOSIS — R200 Anesthesia of skin: Secondary | ICD-10-CM | POA: Diagnosis not present

## 2023-06-04 DIAGNOSIS — Z860100 Personal history of colon polyps, unspecified: Secondary | ICD-10-CM

## 2023-06-04 DIAGNOSIS — R739 Hyperglycemia, unspecified: Secondary | ICD-10-CM | POA: Diagnosis not present

## 2023-06-04 DIAGNOSIS — G4730 Sleep apnea, unspecified: Secondary | ICD-10-CM

## 2023-06-04 DIAGNOSIS — I700 Atherosclerosis of aorta: Secondary | ICD-10-CM | POA: Diagnosis not present

## 2023-06-04 DIAGNOSIS — Z0000 Encounter for general adult medical examination without abnormal findings: Secondary | ICD-10-CM

## 2023-06-04 DIAGNOSIS — E7800 Pure hypercholesterolemia, unspecified: Secondary | ICD-10-CM

## 2023-06-04 DIAGNOSIS — I4891 Unspecified atrial fibrillation: Secondary | ICD-10-CM

## 2023-06-04 DIAGNOSIS — G40909 Epilepsy, unspecified, not intractable, without status epilepticus: Secondary | ICD-10-CM

## 2023-06-04 MED ORDER — ROSUVASTATIN CALCIUM 40 MG PO TABS: 40.0000 mg | ORAL_TABLET | Freq: Every day | ORAL | 3 refills | Status: DC

## 2023-06-04 NOTE — Assessment & Plan Note (Signed)
Has known sleep apnea.  CPAP.  Discussed. Will use saline nasal spray along with steroid nasal spray - prior to bed - to keep nasal passage open.

## 2023-06-04 NOTE — Assessment & Plan Note (Signed)
Issues with CTS - right.  Discussed further w/up and evaluation.  Wants to hold on any further intervention.  Splint is working for him. Follow.

## 2023-06-04 NOTE — Assessment & Plan Note (Signed)
Doing well on keppra.  Followed by neurology.  

## 2023-06-04 NOTE — Assessment & Plan Note (Signed)
Physical today 06/04/23.  Colonoscopy 05/2017 .  psa 01/20/23 - 1.04

## 2023-06-04 NOTE — Assessment & Plan Note (Signed)
Heart catheterization 01/26/14 - proximal LAD 50%, mid LAD 99%, small first diagonal - 90% stenosis, first obtuse marginal 70% and small non dominant RCA.  S/P stent placement mid LAD (99%-0%).   Dr Mariah Milling f/u 12/2022 - Stress test reviewed showing very small region of possible ischemia in the distal inferolateral wall.  Hold on cath - no symptoms. Now on crestor 40mg  q day - goal LDL < 70.  Currently stable.

## 2023-06-04 NOTE — Assessment & Plan Note (Signed)
Colonoscopy 05/2017 - pathology - moderate active colitis, tubular adenoma. With family history of colon cancer and previous colonoscopy, was referred back to GI.  Was scheduled in 12/2022 for colonoscopy.  Discussed today.  Wants to hold at this point.  Bowels stable.  Follow.

## 2023-06-04 NOTE — Assessment & Plan Note (Signed)
Continue crestor 

## 2023-06-04 NOTE — Assessment & Plan Note (Signed)
Continue crestor.  Recent LDL 49.  Continue diet and exercise.  Follow lipid panel and liver function tests.

## 2023-06-04 NOTE — Progress Notes (Signed)
Subjective:    Patient ID: Edwin [name], male    DOB: 05/16/1941, 82 y.o.   MRN: [id number]  Patient here for  Chief Complaint  Patient presents with   Annual Exam    HPI Here for a physical exam. Followed by neurology - history of seizures.  Last evaluated 06/10/22.  Continues on keppra.  No seizures reported.  Has OSA.  Using cpap. Saw Dr Mariah Milling 11/25/22 - new afib.  Started metoprolol and eliquis. ECHO  and lexiscan myoview ordered.  Very small region of possible ischemia, less notable on attenuation corrected images.  Dr Mariah Milling felt given small area, lack of angina, will hold off on cardiac catheterization. Echo showed normal LV function with mild MR/AI, and mildly to moderately dilated left atrium. He made himself an appt with Dr Juliann Pares for evaluation.  Recommended to monitor. Last visit with me, changed simvastatin to crestor.  Tolerating.  Discussed labs.  LDL now 49.  No chest pain reported.  Breathing stable. No cough.  No abdominal pain or bowel change reported.  No blood in stool.  Discussed colonoscopy.  Discussed sleep. Using cpap.  Discussed keeping nose clear.    Past Medical History:  Diagnosis Date   Allergy    Arthritis    Coronary artery disease    a. 12/2013 PCI: LM nl, LAD 50p, 76m (3.0x28 Alpine Xience DES), D1 90, LCX nl, OM1 70, RCA nl; b. 12/2022 MV: apical-mid inflat ischemia, mildly reduced EF.   Diverticulosis of colon    GERD (gastroesophageal reflux disease)    ESOPHAGEAL REFLUX   Glaucoma    Gout    Hemorrhoids    History of kidney stones    Hx of colonic polyp    Hypercholesterolemia    Hypertension    LVH (left ventricular hypertrophy)    a. 12/2022 Echo: EF 60-65%, no rwma, mod conc LVH. Nl RV fxn. RVSP . Mild to mod LAE. Mild MR/AI.   Schatzki's ring    Seizures (HCC)    Past Surgical History:  Procedure Laterality Date   CARDIAC CATHETERIZATION     COLONOSCOPY WITH PROPOFOL N/A 05/18/2017   Procedure: COLONOSCOPY WITH PROPOFOL;   Surgeon: Scot Jun, MD;  Location: [facility] ENDOSCOPY;  Service: Endoscopy;  Laterality: N/A;   CORONARY ANGIOPLASTY     EYE SURGERY  07/20/13   cataract   HERNIA REPAIR Right 2008 & 2012   VASECTOMY  1984   Family History  Problem Relation Age of Onset   Arthritis Mother    Heart disease Mother    Stroke Mother    Hypertension Mother    Arthritis Father    Hyperlipidemia Father    Heart disease Father    Hypertension Father    Parkinson's disease Father    Multiple sclerosis Sister    Colon cancer Maternal Grandmother    Arthritis Paternal Grandmother    Colon cancer Paternal Grandmother    Social History   Socioeconomic History   Marital status: Married    Spouse name: Not on file   Number of children: Not on file   Years of education: Not on file   Highest education level: Not on file  Occupational History   Not on file  Tobacco Use   Smoking status: Never   Smokeless tobacco: Never  Vaping Use   Vaping status: Never Used  Substance and Sexual Activity   Alcohol use: No    Alcohol/week: 0.0 standard drinks of alcohol   Drug use:  No   Sexual activity: Never  Other Topics Concern   Not on file  Social History Narrative   Not on file   Social Determinants of Health   Financial Resource Strain: Low Risk  (03/04/2023)   Overall Financial Resource Strain (CARDIA)    Difficulty of Paying Living Expenses: Not hard at all  Food Insecurity: No Food Insecurity (03/04/2023)   Hunger Vital Sign    Worried About Running Out of Food in the Last Year: Never true    Ran Out of Food in the Last Year: Never true  Transportation Needs: No Transportation Needs (03/04/2023)   PRAPARE - Administrator, Civil Service (Medical): No    Lack of Transportation (Non-Medical): No  Physical Activity: Sufficiently Active (03/04/2023)   Exercise Vital Sign    Days of Exercise per Week: 5 days    Minutes of Exercise per Session: 30 min  Stress: No Stress Concern Present  (03/04/2023)   [employer] of Occupational Health - Occupational Stress Questionnaire    Feeling of Stress : Not at all  Social Connections: Moderately Integrated (03/04/2023)   Social Connection and Isolation Panel [NHANES]    Frequency of Communication with Friends and Family: More than three times a week    Frequency of Social Gatherings with Friends and Family: More than three times a week    Attends Religious Services: 1 to 4 times per year    Active Member of [employer] or Organizations: No    Attends Banker Meetings: Never    Marital Status: Married     Review of Systems  Constitutional:  Negative for appetite change and unexpected weight change.  HENT:  Negative for sinus pressure.        Some occasional congestion.   Respiratory:  Negative for cough, chest tightness and shortness of breath.   Cardiovascular:  Negative for chest pain, palpitations and leg swelling.  Gastrointestinal:  Negative for abdominal pain, diarrhea, nausea and vomiting.  Genitourinary:  Negative for difficulty urinating and dysuria.  Musculoskeletal:  Negative for joint swelling and myalgias.  Skin:  Negative for color change and rash.  Neurological:  Negative for dizziness and headaches.  Psychiatric/Behavioral:  Negative for agitation and dysphoric mood.        Objective:     BP 112/70   Pulse 72   Temp 98 F (36.7 C)   Resp 16   Ht 5\' 8"  (1.727 m)   Wt 175 lb 3.2 oz (79.5 kg)   SpO2 98%   BMI 26.64 kg/m  Wt Readings from Last 3 Encounters:  06/04/23 175 lb 3.2 oz (79.5 kg)  03/04/23 176 lb (79.8 kg)  01/23/23 177 lb 3.2 oz (80.4 kg)    Physical Exam Vitals reviewed.  Constitutional:      General: He is not in acute distress.    Appearance: Normal appearance. He is well-developed.  HENT:     Head: Normocephalic and atraumatic.     Right Ear: External ear normal.     Left Ear: External ear normal.  Eyes:     General: No scleral icterus.       Right eye: No  discharge.        Left eye: No discharge.     Conjunctiva/sclera: Conjunctivae normal.  Cardiovascular:     Rate and Rhythm: Normal rate and regular rhythm.  Pulmonary:     Effort: Pulmonary effort is normal. No respiratory distress.     Breath sounds: Normal  breath sounds.  Abdominal:     General: Bowel sounds are normal.     Palpations: Abdomen is soft.     Tenderness: There is no abdominal tenderness.  Musculoskeletal:        General: No swelling or tenderness.     Cervical back: Neck supple. No tenderness.  Lymphadenopathy:     Cervical: No cervical adenopathy.  Skin:    Findings: No erythema or rash.  Neurological:     Mental Status: He is alert.  Psychiatric:        Mood and Affect: Mood normal.        Behavior: Behavior normal.      Outpatient Encounter Medications as of 06/04/2023  Medication Sig   apixaban (ELIQUIS) 5 MG TABS tablet Take 1 tablet (5 mg total) by mouth 2 (two) times daily.   aspirin 81 MG tablet Take 81 mg by mouth at bedtime.   azelastine (ASTELIN) 0.1 % nasal spray Place 1 spray into both nostrils 2 (two) times daily.   Cholecalciferol (VITAMIN D3) 2000 UNITS TABS Take 2,000 Units by mouth daily.    fluticasone (FLONASE) 50 MCG/ACT nasal spray USE 2 SPRAYS IN EACH NOSTRIL EVERY DAY   ketoconazole (NIZORAL) 2 % shampoo Apply topically. 2-3 times weekly   latanoprost (XALATAN) 0.005 % ophthalmic solution Place 1 drop into both eyes at bedtime.   levETIRAcetam (KEPPRA XR) 500 MG 24 hr tablet Take 1,000 mg by mouth daily.   levocetirizine (XYZAL) 5 MG tablet TAKE 1 TABLET EVERY EVENING   MAGNESIUM GLYCINATE PO Take 240 mg by mouth daily.   metoprolol succinate (TOPROL XL) 25 MG 24 hr tablet Take 1 tablet (25 mg total) by mouth daily.   Multiple Vitamin (MULTIVITAMIN) tablet Take 1 tablet by mouth daily.   Omega-3 Fatty Acids (FISH OIL) 1200 MG CAPS Take 1,200 mg by mouth daily.   omeprazole (PRILOSEC) 20 MG capsule TAKE 1 CAPSULE TWICE DAILY BEFORE  MEALS   polyethylene glycol (MIRALAX / GLYCOLAX) 17 g packet Take 17 g by mouth daily.   Psyllium 100 % POWD 1 TSP Daily   rosuvastatin (CRESTOR) 40 MG tablet Take 1 tablet (40 mg total) by mouth daily.   Saw Palmetto 450 MG CAPS Take 450 mg by mouth daily.    Zinc 50 MG TABS Take 50 mg by mouth daily.   [DISCONTINUED] rosuvastatin (CRESTOR) 40 MG tablet Take 1 tablet (40 mg total) by mouth daily.   [DISCONTINUED] triamcinolone (KENALOG) 0.1 % Apply 1 application topically 2 (two) times daily. (Patient not taking: Reported on 03/04/2023)   No facility-administered encounter medications on file as of 06/04/2023.     Lab Results  Component Value Date   WBC 6.4 01/20/2023   HGB 15.0 01/20/2023   HCT 44.4 01/20/2023   PLT 231.0 01/20/2023   GLUCOSE 106 (H) 06/01/2023   CHOL 116 06/01/2023   TRIG 176.0 (H) 06/01/2023   HDL 32.70 (L) 06/01/2023   LDLDIRECT 99.1 07/29/2013   LDLCALC 49 06/01/2023   ALT 28 06/01/2023   AST 27 06/01/2023   NA 138 06/01/2023   K 4.1 06/01/2023   CL 105 06/01/2023   CREATININE 1.12 06/01/2023   BUN 15 06/01/2023   CO2 24 06/01/2023   TSH 3.42 01/20/2023   PSA 1.04 01/20/2023   HGBA1C 5.8 01/20/2023    NM Myocar Multi W/Spect W/Wall Motion / EF  Result Date: 12/01/2022   Findings are consistent with ischemia. The study is intermediate risk.   No  ST deviation was noted.   LV perfusion is abnormal. There is evidence of ischemia. There is no evidence of infarction. Defect 1: There is a medium defect with moderate reduction in uptake present in the apical to mid inferolateral location(s) that is reversible. There is normal wall motion in the defect area. Consistent with ischemia.   Left ventricular function is abnormal. Global function is mildly reduced. End diastolic cavity size is normal. End systolic cavity size is normal.   Suboptimal study due to intense GI uptake.       Assessment & Plan:  Hyperglycemia Assessment & Plan: Low carb diet and exercise.   Follow sugar.   Orders: -     Hemoglobin A1c; Future  Hypercholesterolemia Assessment & Plan: Continue crestor.  Recent LDL 49.  Continue diet and exercise.  Follow lipid panel and liver function tests.   Orders: -     Lipid panel; Future -     Hepatic function panel; Future -     Basic metabolic panel; Future  Health care maintenance Assessment & Plan: Physical today 06/04/23.  Colonoscopy 05/2017 .  psa 01/20/23 - 1.04     Aortic atherosclerosis (HCC) Assessment & Plan: Continue crestor.    Atrial fibrillation, unspecified type [facility]) Assessment & Plan: Dr Mariah Milling 10/2022 - New onset atrial fibrillation. stop aspirin, start Eliquis 5 twice daily. Start metoprolol succinate 25 daily. Plan ECHO. F/u 01/16/23 - recent stress testing was of intermediate risk with a small area of apical-mid inferolateral ischemia. Echo shows normal LV function with mildly to moderately dilated left atrium. Elected to hold on cardioversion.  Wants to continue current medications - eliquis and metoprolol.  He scheduled an appt with Dr Juliann Pares.  Elects to continue f/u with Dr Juliann Pares.  Stable.    Coronary artery disease involving native coronary artery of native heart with angina pectoris [facility]) Assessment & Plan: Heart catheterization 01/26/14 - proximal LAD 50%, mid LAD 99%, small first diagonal - 90% stenosis, first obtuse marginal 70% and small non dominant RCA.  S/P stent placement mid LAD (99%-0%).   Dr Mariah Milling f/u 12/2022 - Stress test reviewed showing very small region of possible ischemia in the distal inferolateral wall.  Hold on cath - no symptoms. Now on crestor 40mg  q day - goal LDL < 70.  Currently stable.     Essential hypertension, benign Assessment & Plan: Blood pressure as outlined.  On metoprolol.   Follow pressures.  Follow metabolic panel.    Gastroesophageal reflux disease, unspecified whether esophagitis present Assessment & Plan: Continue omeprazole.  No upper symptoms reported. No  swallowing problems.  Follow.    Hand numbness Assessment & Plan: Issues with CTS - right.  Discussed further w/up and evaluation.  Wants to hold on any further intervention.  Splint is working for him. Follow.    History of colonic polyps Assessment & Plan: Colonoscopy 05/2017 - pathology - moderate active colitis, tubular adenoma. With family history of colon cancer and previous colonoscopy, was referred back to GI.  Was scheduled in 12/2022 for colonoscopy.  Discussed today.  Wants to hold at this point.  Bowels stable.  Follow.    Seizure disorder [facility]) Assessment & Plan: Doing well on keppra.  Followed by neurology.    Sleep apnea, unspecified type Assessment & Plan: Has known sleep apnea.  CPAP.  Discussed. Will use saline nasal spray along with steroid nasal spray - prior to bed - to keep nasal passage open.    Other orders -  Rosuvastatin Calcium; Take 1 tablet (40 mg total) by mouth daily.  Dispense: 90 tablet; Refill: 3     Dale [place], MD

## 2023-06-04 NOTE — Assessment & Plan Note (Signed)
Continue omeprazole.  No upper symptoms reported. No swallowing problems.  Follow.  ?

## 2023-06-04 NOTE — Assessment & Plan Note (Signed)
Dr Mariah Milling 10/2022 - New onset atrial fibrillation. stop aspirin, start Eliquis 5 twice daily. Start metoprolol succinate 25 daily. Plan ECHO. F/u 01/16/23 - recent stress testing was of intermediate risk with a small area of apical-mid inferolateral ischemia. Echo shows normal LV function with mildly to moderately dilated left atrium. Elected to hold on cardioversion.  Wants to continue current medications - eliquis and metoprolol.  He scheduled an appt with Dr Juliann Pares.  Elects to continue f/u with Dr Juliann Pares.  Stable.

## 2023-06-04 NOTE — Assessment & Plan Note (Signed)
Blood pressure as outlined.  On metoprolol.  Follow pressures. Follow metabolic panel.  

## 2023-06-04 NOTE — Assessment & Plan Note (Signed)
Low carb diet and exercise.  Follow sugar.  

## 2023-06-11 DIAGNOSIS — G4733 Obstructive sleep apnea (adult) (pediatric): Secondary | ICD-10-CM | POA: Diagnosis not present

## 2023-06-11 DIAGNOSIS — R030 Elevated blood-pressure reading, without diagnosis of hypertension: Secondary | ICD-10-CM | POA: Diagnosis not present

## 2023-06-11 DIAGNOSIS — R569 Unspecified convulsions: Secondary | ICD-10-CM | POA: Diagnosis not present

## 2023-06-11 DIAGNOSIS — G939 Disorder of brain, unspecified: Secondary | ICD-10-CM | POA: Diagnosis not present

## 2023-06-15 ENCOUNTER — Ambulatory Visit: Payer: Medicare HMO

## 2023-06-16 ENCOUNTER — Ambulatory Visit: Payer: Medicare HMO

## 2023-06-16 DIAGNOSIS — Z23 Encounter for immunization: Secondary | ICD-10-CM | POA: Diagnosis not present

## 2023-06-16 NOTE — Progress Notes (Signed)
Patient presented for a high dose flu vaccine and it was administered into his right deltoid. Patient tolerated the vaccine well.

## 2023-06-22 DIAGNOSIS — H40003 Preglaucoma, unspecified, bilateral: Secondary | ICD-10-CM | POA: Diagnosis not present

## 2023-06-29 DIAGNOSIS — R051 Acute cough: Secondary | ICD-10-CM | POA: Diagnosis not present

## 2023-06-29 DIAGNOSIS — J019 Acute sinusitis, unspecified: Secondary | ICD-10-CM | POA: Diagnosis not present

## 2023-06-29 DIAGNOSIS — U071 COVID-19: Secondary | ICD-10-CM | POA: Diagnosis not present

## 2023-06-29 DIAGNOSIS — Z03818 Encounter for observation for suspected exposure to other biological agents ruled out: Secondary | ICD-10-CM | POA: Diagnosis not present

## 2023-07-04 ENCOUNTER — Emergency Department: Payer: Medicare HMO

## 2023-07-04 ENCOUNTER — Other Ambulatory Visit: Payer: Self-pay

## 2023-07-04 DIAGNOSIS — I10 Essential (primary) hypertension: Secondary | ICD-10-CM | POA: Diagnosis not present

## 2023-07-04 DIAGNOSIS — A419 Sepsis, unspecified organism: Secondary | ICD-10-CM | POA: Diagnosis not present

## 2023-07-04 DIAGNOSIS — J1282 Pneumonia due to coronavirus disease 2019: Secondary | ICD-10-CM | POA: Diagnosis present

## 2023-07-04 DIAGNOSIS — J9601 Acute respiratory failure with hypoxia: Secondary | ICD-10-CM | POA: Diagnosis not present

## 2023-07-04 DIAGNOSIS — Z7982 Long term (current) use of aspirin: Secondary | ICD-10-CM

## 2023-07-04 DIAGNOSIS — Z880 Allergy status to penicillin: Secondary | ICD-10-CM

## 2023-07-04 DIAGNOSIS — G40909 Epilepsy, unspecified, not intractable, without status epilepticus: Secondary | ICD-10-CM | POA: Diagnosis not present

## 2023-07-04 DIAGNOSIS — Z860100 Personal history of colon polyps, unspecified: Secondary | ICD-10-CM

## 2023-07-04 DIAGNOSIS — R509 Fever, unspecified: Secondary | ICD-10-CM | POA: Diagnosis not present

## 2023-07-04 DIAGNOSIS — E871 Hypo-osmolality and hyponatremia: Secondary | ICD-10-CM | POA: Diagnosis not present

## 2023-07-04 DIAGNOSIS — Z888 Allergy status to other drugs, medicaments and biological substances status: Secondary | ICD-10-CM

## 2023-07-04 DIAGNOSIS — R000 Tachycardia, unspecified: Secondary | ICD-10-CM | POA: Diagnosis not present

## 2023-07-04 DIAGNOSIS — J984 Other disorders of lung: Secondary | ICD-10-CM | POA: Diagnosis not present

## 2023-07-04 DIAGNOSIS — Z87442 Personal history of urinary calculi: Secondary | ICD-10-CM

## 2023-07-04 DIAGNOSIS — Z8261 Family history of arthritis: Secondary | ICD-10-CM

## 2023-07-04 DIAGNOSIS — A4189 Other specified sepsis: Principal | ICD-10-CM | POA: Diagnosis present

## 2023-07-04 DIAGNOSIS — E7800 Pure hypercholesterolemia, unspecified: Secondary | ICD-10-CM | POA: Diagnosis present

## 2023-07-04 DIAGNOSIS — Z820 Family history of epilepsy and other diseases of the nervous system: Secondary | ICD-10-CM | POA: Diagnosis not present

## 2023-07-04 DIAGNOSIS — J159 Unspecified bacterial pneumonia: Secondary | ICD-10-CM | POA: Diagnosis present

## 2023-07-04 DIAGNOSIS — I2510 Atherosclerotic heart disease of native coronary artery without angina pectoris: Secondary | ICD-10-CM | POA: Diagnosis present

## 2023-07-04 DIAGNOSIS — Z7901 Long term (current) use of anticoagulants: Secondary | ICD-10-CM

## 2023-07-04 DIAGNOSIS — E162 Hypoglycemia, unspecified: Secondary | ICD-10-CM | POA: Diagnosis not present

## 2023-07-04 DIAGNOSIS — Z800 Family history of malignant neoplasm of digestive organs: Secondary | ICD-10-CM

## 2023-07-04 DIAGNOSIS — Z823 Family history of stroke: Secondary | ICD-10-CM | POA: Diagnosis not present

## 2023-07-04 DIAGNOSIS — U071 COVID-19: Secondary | ICD-10-CM | POA: Diagnosis present

## 2023-07-04 DIAGNOSIS — Z83438 Family history of other disorder of lipoprotein metabolism and other lipidemia: Secondary | ICD-10-CM

## 2023-07-04 DIAGNOSIS — Z9861 Coronary angioplasty status: Secondary | ICD-10-CM

## 2023-07-04 DIAGNOSIS — Z8249 Family history of ischemic heart disease and other diseases of the circulatory system: Secondary | ICD-10-CM | POA: Diagnosis not present

## 2023-07-04 DIAGNOSIS — E559 Vitamin D deficiency, unspecified: Secondary | ICD-10-CM | POA: Diagnosis present

## 2023-07-04 DIAGNOSIS — J189 Pneumonia, unspecified organism: Secondary | ICD-10-CM | POA: Diagnosis not present

## 2023-07-04 DIAGNOSIS — I480 Paroxysmal atrial fibrillation: Secondary | ICD-10-CM | POA: Diagnosis present

## 2023-07-04 DIAGNOSIS — Z882 Allergy status to sulfonamides status: Secondary | ICD-10-CM

## 2023-07-04 DIAGNOSIS — K219 Gastro-esophageal reflux disease without esophagitis: Secondary | ICD-10-CM | POA: Diagnosis present

## 2023-07-04 DIAGNOSIS — R918 Other nonspecific abnormal finding of lung field: Secondary | ICD-10-CM | POA: Diagnosis not present

## 2023-07-04 DIAGNOSIS — I700 Atherosclerosis of aorta: Secondary | ICD-10-CM | POA: Diagnosis not present

## 2023-07-04 DIAGNOSIS — Z79899 Other long term (current) drug therapy: Secondary | ICD-10-CM | POA: Diagnosis not present

## 2023-07-04 DIAGNOSIS — R059 Cough, unspecified: Secondary | ICD-10-CM | POA: Diagnosis not present

## 2023-07-04 DIAGNOSIS — I517 Cardiomegaly: Secondary | ICD-10-CM | POA: Diagnosis not present

## 2023-07-04 DIAGNOSIS — M109 Gout, unspecified: Secondary | ICD-10-CM | POA: Diagnosis present

## 2023-07-04 LAB — CBC WITH DIFFERENTIAL/PLATELET
Abs Immature Granulocytes: 0.05 10*3/uL (ref 0.00–0.07)
Basophils Absolute: 0 10*3/uL (ref 0.0–0.1)
Basophils Relative: 0 %
Eosinophils Absolute: 0.2 10*3/uL (ref 0.0–0.5)
Eosinophils Relative: 2 %
HCT: 40.5 % (ref 39.0–52.0)
Hemoglobin: 13.9 g/dL (ref 13.0–17.0)
Immature Granulocytes: 1 %
Lymphocytes Relative: 7 %
Lymphs Abs: 0.7 10*3/uL (ref 0.7–4.0)
MCH: 29.1 pg (ref 26.0–34.0)
MCHC: 34.3 g/dL (ref 30.0–36.0)
MCV: 84.9 fL (ref 80.0–100.0)
Monocytes Absolute: 0.9 10*3/uL (ref 0.1–1.0)
Monocytes Relative: 9 %
Neutro Abs: 8.8 10*3/uL — ABNORMAL HIGH (ref 1.7–7.7)
Neutrophils Relative %: 81 %
Platelets: 196 10*3/uL (ref 150–400)
RBC: 4.77 MIL/uL (ref 4.22–5.81)
RDW: 13.2 % (ref 11.5–15.5)
WBC: 10.7 10*3/uL — ABNORMAL HIGH (ref 4.0–10.5)
nRBC: 0 % (ref 0.0–0.2)

## 2023-07-04 LAB — BASIC METABOLIC PANEL
Anion gap: 10 (ref 5–15)
BUN: 22 mg/dL (ref 8–23)
CO2: 21 mmol/L — ABNORMAL LOW (ref 22–32)
Calcium: 9.1 mg/dL (ref 8.9–10.3)
Chloride: 102 mmol/L (ref 98–111)
Creatinine, Ser: 1.02 mg/dL (ref 0.61–1.24)
GFR, Estimated: >60 mL/min (ref >60)
Glucose, Bld: 146 mg/dL — ABNORMAL HIGH (ref 70–99)
Potassium: 4.2 mmol/L (ref 3.5–5.1)
Sodium: 133 mmol/L — ABNORMAL LOW (ref 135–145)

## 2023-07-04 NOTE — ED Triage Notes (Signed)
Pt wife sts that pt was dx with COVID on Monday. Wife sts that pt has been having a cough with a fever for the last day.

## 2023-07-05 ENCOUNTER — Inpatient Hospital Stay
Admission: EM | Admit: 2023-07-05 | Discharge: 2023-07-11 | DRG: 871 | Disposition: A | Discharge status: Home Health | Payer: Medicare HMO | Attending: Student | Admitting: Student

## 2023-07-05 ENCOUNTER — Other Ambulatory Visit: Payer: Self-pay

## 2023-07-05 DIAGNOSIS — J9601 Acute respiratory failure with hypoxia: Secondary | ICD-10-CM | POA: Diagnosis not present

## 2023-07-05 DIAGNOSIS — Z83438 Family history of other disorder of lipoprotein metabolism and other lipidemia: Secondary | ICD-10-CM | POA: Diagnosis not present

## 2023-07-05 DIAGNOSIS — Z8261 Family history of arthritis: Secondary | ICD-10-CM | POA: Diagnosis not present

## 2023-07-05 DIAGNOSIS — A4189 Other specified sepsis: Secondary | ICD-10-CM | POA: Diagnosis present

## 2023-07-05 DIAGNOSIS — Z860100 Personal history of colon polyps, unspecified: Secondary | ICD-10-CM | POA: Diagnosis not present

## 2023-07-05 DIAGNOSIS — U071 COVID-19: Principal | ICD-10-CM

## 2023-07-05 DIAGNOSIS — Z7982 Long term (current) use of aspirin: Secondary | ICD-10-CM | POA: Diagnosis not present

## 2023-07-05 DIAGNOSIS — J189 Pneumonia, unspecified organism: Secondary | ICD-10-CM | POA: Diagnosis present

## 2023-07-05 DIAGNOSIS — Z820 Family history of epilepsy and other diseases of the nervous system: Secondary | ICD-10-CM | POA: Diagnosis not present

## 2023-07-05 DIAGNOSIS — Z8249 Family history of ischemic heart disease and other diseases of the circulatory system: Secondary | ICD-10-CM | POA: Diagnosis not present

## 2023-07-05 DIAGNOSIS — Z800 Family history of malignant neoplasm of digestive organs: Secondary | ICD-10-CM | POA: Diagnosis not present

## 2023-07-05 DIAGNOSIS — A419 Sepsis, unspecified organism: Secondary | ICD-10-CM

## 2023-07-05 DIAGNOSIS — J1282 Pneumonia due to coronavirus disease 2019: Principal | ICD-10-CM

## 2023-07-05 DIAGNOSIS — Z79899 Other long term (current) drug therapy: Secondary | ICD-10-CM | POA: Diagnosis not present

## 2023-07-05 DIAGNOSIS — Z87442 Personal history of urinary calculi: Secondary | ICD-10-CM | POA: Diagnosis not present

## 2023-07-05 DIAGNOSIS — G40909 Epilepsy, unspecified, not intractable, without status epilepticus: Secondary | ICD-10-CM | POA: Diagnosis present

## 2023-07-05 DIAGNOSIS — Z823 Family history of stroke: Secondary | ICD-10-CM | POA: Diagnosis not present

## 2023-07-05 DIAGNOSIS — E7800 Pure hypercholesterolemia, unspecified: Secondary | ICD-10-CM | POA: Diagnosis present

## 2023-07-05 DIAGNOSIS — Z7901 Long term (current) use of anticoagulants: Secondary | ICD-10-CM | POA: Diagnosis not present

## 2023-07-05 DIAGNOSIS — Z9861 Coronary angioplasty status: Secondary | ICD-10-CM | POA: Diagnosis not present

## 2023-07-05 DIAGNOSIS — I10 Essential (primary) hypertension: Secondary | ICD-10-CM | POA: Diagnosis present

## 2023-07-05 DIAGNOSIS — E871 Hypo-osmolality and hyponatremia: Secondary | ICD-10-CM | POA: Diagnosis present

## 2023-07-05 DIAGNOSIS — J159 Unspecified bacterial pneumonia: Secondary | ICD-10-CM | POA: Diagnosis present

## 2023-07-05 DIAGNOSIS — I2510 Atherosclerotic heart disease of native coronary artery without angina pectoris: Secondary | ICD-10-CM | POA: Diagnosis present

## 2023-07-05 DIAGNOSIS — E559 Vitamin D deficiency, unspecified: Secondary | ICD-10-CM | POA: Diagnosis present

## 2023-07-05 DIAGNOSIS — I480 Paroxysmal atrial fibrillation: Secondary | ICD-10-CM | POA: Diagnosis present

## 2023-07-05 LAB — RESP PANEL BY RT-PCR (RSV, FLU A&B, COVID)  RVPGX2
Influenza A by PCR: NEGATIVE
Influenza B by PCR: NEGATIVE
Resp Syncytial Virus by PCR: NEGATIVE
SARS Coronavirus 2 by RT PCR: POSITIVE — AB

## 2023-07-05 LAB — URINALYSIS, W/ REFLEX TO CULTURE (INFECTION SUSPECTED)
Bacteria, UA: NONE SEEN
Bilirubin Urine: NEGATIVE
Glucose, UA: NEGATIVE mg/dL
Hgb urine dipstick: NEGATIVE
Ketones, ur: 20 mg/dL — AB
Leukocytes,Ua: NEGATIVE
Nitrite: NEGATIVE
Protein, ur: 100 mg/dL — AB
Specific Gravity, Urine: 1.034 — ABNORMAL HIGH (ref 1.005–1.030)
pH: 5 (ref 5.0–8.0)

## 2023-07-05 LAB — LACTIC ACID, PLASMA: Lactic Acid, Venous: 1.4 mmol/L (ref 0.5–1.9)

## 2023-07-05 LAB — PROCALCITONIN: Procalcitonin: 0.31 ng/mL

## 2023-07-05 MED ORDER — LEVOFLOXACIN IN D5W 750 MG/150ML IV SOLN
750.0000 mg | Freq: Once | INTRAVENOUS | Status: AC
Start: 2023-07-05 — End: 2023-07-05
  Administered 2023-07-05: 750 mg via INTRAVENOUS
  Filled 2023-07-05: qty 150

## 2023-07-05 MED ORDER — LEVETIRACETAM ER 500 MG PO TB24
1000.0000 mg | ORAL_TABLET | Freq: Every day | ORAL | Status: DC
  Administered 2023-07-05 – 2023-07-11 (×7): 1000 mg via ORAL
  Filled 2023-07-05 (×9): qty 2

## 2023-07-05 MED ORDER — LEVOCETIRIZINE DIHYDROCHLORIDE 5 MG PO TABS: 5.0000 mg | ORAL_TABLET | Freq: Every evening | ORAL | Status: DC

## 2023-07-05 MED ORDER — FLUTICASONE PROPIONATE 50 MCG/ACT NA SUSP
2.0000 | Freq: Every day | NASAL | Status: DC
  Administered 2023-07-05 – 2023-07-11 (×7): 2 via NASAL
  Filled 2023-07-05 (×2): qty 16

## 2023-07-05 MED ORDER — ROSUVASTATIN CALCIUM 20 MG PO TABS
40.0000 mg | ORAL_TABLET | Freq: Every day | ORAL | Status: DC
  Administered 2023-07-05 – 2023-07-11 (×7): 40 mg via ORAL
  Filled 2023-07-05 (×8): qty 2

## 2023-07-05 MED ORDER — IPRATROPIUM-ALBUTEROL 0.5-2.5 (3) MG/3ML IN SOLN
3.0000 mL | Freq: Four times a day (QID) | RESPIRATORY_TRACT | Status: DC
  Administered 2023-07-05 – 2023-07-07 (×8): 3 mL via RESPIRATORY_TRACT
  Filled 2023-07-05 (×9): qty 3

## 2023-07-05 MED ORDER — APIXABAN 5 MG PO TABS
5.0000 mg | ORAL_TABLET | Freq: Two times a day (BID) | ORAL | Status: DC
  Administered 2023-07-05 – 2023-07-11 (×13): 5 mg via ORAL
  Filled 2023-07-05 (×13): qty 1

## 2023-07-05 MED ORDER — BENZONATATE 100 MG PO CAPS: 100.0000 mg | ORAL_CAPSULE | Freq: Three times a day (TID) | ORAL | Status: DC | PRN

## 2023-07-05 MED ORDER — ACETAMINOPHEN 650 MG RE SUPP: 650.0000 mg | Freq: Four times a day (QID) | RECTAL | Status: DC | PRN

## 2023-07-05 MED ORDER — LORATADINE 10 MG PO TABS
10.0000 mg | ORAL_TABLET | Freq: Every day | ORAL | Status: DC
  Administered 2023-07-05 – 2023-07-11 (×7): 10 mg via ORAL
  Filled 2023-07-05 (×7): qty 1

## 2023-07-05 MED ORDER — POLYETHYLENE GLYCOL 3350 17 G PO PACK
17.0000 g | PACK | Freq: Every day | ORAL | Status: DC
  Administered 2023-07-06 – 2023-07-11 (×4): 17 g via ORAL
  Filled 2023-07-05 (×6): qty 1

## 2023-07-05 MED ORDER — GUAIFENESIN ER 600 MG PO TB12
1200.0000 mg | ORAL_TABLET | Freq: Two times a day (BID) | ORAL | Status: DC
  Administered 2023-07-05 – 2023-07-08 (×7): 1200 mg via ORAL
  Filled 2023-07-05 (×7): qty 2

## 2023-07-05 MED ORDER — METOPROLOL SUCCINATE ER 25 MG PO TB24
25.0000 mg | ORAL_TABLET | Freq: Every day | ORAL | Status: DC
  Administered 2023-07-05 – 2023-07-11 (×7): 25 mg via ORAL
  Filled 2023-07-05 (×7): qty 1

## 2023-07-05 MED ORDER — PANTOPRAZOLE SODIUM 40 MG PO TBEC
40.0000 mg | DELAYED_RELEASE_TABLET | Freq: Every day | ORAL | Status: DC
  Administered 2023-07-05 – 2023-07-11 (×7): 40 mg via ORAL
  Filled 2023-07-05 (×8): qty 1

## 2023-07-05 MED ORDER — ACETAMINOPHEN 325 MG PO TABS
650.0000 mg | ORAL_TABLET | Freq: Four times a day (QID) | ORAL | Status: DC | PRN
  Administered 2023-07-05 – 2023-07-06 (×2): 650 mg via ORAL
  Filled 2023-07-05 (×2): qty 2

## 2023-07-05 MED ORDER — ONDANSETRON HCL 4 MG PO TABS: 4.0000 mg | ORAL_TABLET | Freq: Four times a day (QID) | ORAL | Status: DC | PRN

## 2023-07-05 MED ORDER — ACETAMINOPHEN 500 MG PO TABS
1000.0000 mg | ORAL_TABLET | Freq: Once | ORAL | Status: AC
  Administered 2023-07-05: 1000 mg via ORAL
  Filled 2023-07-05: qty 2

## 2023-07-05 MED ORDER — LATANOPROST 0.005 % OP SOLN
1.0000 [drp] | Freq: Every day | OPHTHALMIC | Status: DC
  Administered 2023-07-05 – 2023-07-10 (×6): 1 [drp] via OPHTHALMIC
  Filled 2023-07-05: qty 2.5

## 2023-07-05 MED ORDER — AZELASTINE HCL 0.1 % NA SOLN
1.0000 | Freq: Two times a day (BID) | NASAL | Status: DC
  Administered 2023-07-05 – 2023-07-11 (×13): 1 via NASAL
  Filled 2023-07-05 (×2): qty 30

## 2023-07-05 MED ORDER — KETOCONAZOLE 2 % EX SHAM
MEDICATED_SHAMPOO | CUTANEOUS | Status: DC
  Filled 2023-07-05: qty 120

## 2023-07-05 MED ORDER — ONDANSETRON HCL 4 MG/2ML IJ SOLN: 4.0000 mg | Freq: Four times a day (QID) | INTRAMUSCULAR | Status: DC | PRN

## 2023-07-05 MED ORDER — PSYLLIUM 95 % PO PACK
1.0000 | PACK | Freq: Every day | ORAL | Status: DC
  Administered 2023-07-05 – 2023-07-11 (×7): 1 via ORAL
  Filled 2023-07-05 (×8): qty 1

## 2023-07-05 NOTE — ED Notes (Signed)
Pt ambulated to bedside commode with steady gate.

## 2023-07-05 NOTE — ED Notes (Signed)
Pt offered urinal to pee. Pt sat at edge of bed and swayed. Pt stated he would feel more comfortable staying in bed to urinate.

## 2023-07-05 NOTE — Progress Notes (Signed)
Occupational Therapy Evaluation Patient Details Name: Edwin [name] MRN: [id number] DOB: 08-14-1941 Today's Date: 07/05/2023   History of Present Illness Edwin [name] is a 82 y.o. male with medical history significant of PAF on Eliquis, CAD stenting, seizure disorder, HTN, HLD, recent COVID+, presented with worsening of cough and fever. Chest x-ray showed left sided lower lobe pneumonia.   Clinical Impression   Pt seen today for OT evaluation, son present for session. PTA, pt reports being IND with ADLs and mobility, no falls in the past year. Pt was driving, and lives at home with his spouse. Pt limited by respiratory status and fatigue with decreased tolerance to activity. Declines full ADL performance d/t fatigue, pt performs bed mobility with CGA, cues to scoot forward and stands with CGA/RW. Able to take small steps laterally toward [facility] with RW, MIN A. Pt declining further mobility due to fatigue. Educated pt and family on d/c recommendations, importance of mobility as tolerated and progression of self-care tasks. Pt would benefit from skilled OT services to address noted impairments and functional limitations (see below for any additional details) in order to maximize safety and independence while minimizing falls risk and caregiver burden. Anticipate the need for follow up OT services upon acute hospital DC.     If plan is discharge home, recommend the following: A little help with walking and/or transfers;A little help with bathing/dressing/bathroom;Assistance with cooking/housework;Assist for transportation    Functional Status Assessment  Patient has had a recent decline in their functional status and demonstrates the ability to make significant improvements in function in a reasonable and predictable amount of time.  Equipment Recommendations  None recommended by OT    Recommendations for Other Services Other (comment)     Precautions / Restrictions  Precautions Precautions: Fall Restrictions Weight Bearing Restrictions: No      Mobility Bed Mobility Overal bed mobility: Needs Assistance Bed Mobility: Supine to Sit, Sit to Supine     Supine to sit: Contact guard Sit to supine: Contact guard assist   General bed mobility comments: increased time, cues to scoot forward on gurney    Transfers Overall transfer level: Needs assistance Equipment used: Rolling walker (2 wheels) Transfers: Sit to/from Stand Sit to Stand: Contact guard assist, Min assist           General transfer comment: stands spontanously from gurney with unsteadiness, OT directs to return to seated and RW used to support UE in standing with CGA STS second attempt.      Balance Overall balance assessment: Needs assistance Sitting-balance support: Feet unsupported, Single extremity supported Sitting balance-Leahy Scale: Good     Standing balance support: Bilateral upper extremity supported, During functional activity Standing balance-Leahy Scale: Fair Standing balance comment: mild unsteadiness in standing, pt unable to ambulate on OT eval due to increased fatigue                           ADL either performed or assessed with clinical judgement   ADL Overall ADL's : Needs assistance/impaired                         Toilet Transfer: Minimal assistance;Rolling walker (2 wheels) Toilet Transfer Details (indicate cue type and reason): simulated, MIN A with RW         Functional mobility during ADLs: Contact guard assist;Minimal assistance;Rolling walker (2 wheels) General ADL Comments: Pt limited by respiratory status and fatigue with  decreased tolerance to activity. Declines full ADL performance d/t fatigue, bed mobiltiy with CGA, cues to scoot forward and stands with CGA/RW. Steps laterally toward HOB with RW.     Vision Baseline Vision/History: 1 Wears glasses              Pertinent Vitals/Pain Pain Assessment Pain  Assessment: No/denies pain     Extremity/Trunk Assessment Upper Extremity Assessment Upper Extremity Assessment: Overall WFL for tasks assessed   Lower Extremity Assessment Lower Extremity Assessment: Overall WFL for tasks assessed       Communication Communication Communication: No apparent difficulties   Cognition Arousal: Alert Behavior During Therapy: WFL for tasks assessed/performed Overall Cognitive Status: Within Functional Limits for tasks assessed                                       General Comments  RR 20-35 during session, pt on RA with SpO2% >93            Home Living Family/patient expects to be discharged to:: Private residence Living Arrangements: Spouse/significant other Available Help at Discharge: Available 24 hours/day;Family Type of Home: House Home Access: Stairs to enter [employer] of Steps: 2 Entrance Stairs-Rails: Can reach both Home Layout: Two level;Able to live on main level with bedroom/bathroom     Bathroom Shower/Tub: Producer, television/film/video: Standard     Home Equipment: Cane - single point          Prior Functioning/Environment Prior Level of Function : Independent/Modified Independent;Driving             Mobility Comments: IND, no AD, no falls in the past year ADLs Comments: IND, driving        OT Problem List: Decreased activity tolerance;Impaired balance (sitting and/or standing);Cardiopulmonary status limiting activity      OT Treatment/Interventions: Self-care/ADL training;Therapeutic exercise;Energy conservation;DME and/or AE instruction;Therapeutic activities;Patient/family education;Balance training    OT Goals(Current goals can be found in the care plan section) Acute Rehab OT Goals Patient Stated Goal: to get stronger OT Goal Formulation: With patient/family Time For Goal Achievement: 07/19/23 Potential to Achieve Goals: Good  OT Frequency: Min 1X/week        AM-PAC OT "6 Clicks" Daily Activity     Outcome Measure Help from another person eating meals?: None Help from another person taking care of personal grooming?: None Help from another person toileting, which includes using toliet, bedpan, or urinal?: A Lot Help from another person bathing (including washing, rinsing, drying)?: A Lot Help from another person to put on and taking off regular upper body clothing?: A Little Help from another person to put on and taking off regular lower body clothing?: A Lot 6 Click Score: 17   End of Session Equipment Utilized During Treatment: Rolling walker (2 wheels) Nurse Communication: Other (comment);Mobility status  Activity Tolerance: Patient limited by fatigue Patient left: in bed;with call bell/phone within reach;with family/visitor present  OT Visit Diagnosis: Unsteadiness on feet (R26.81);Other abnormalities of gait and mobility (R26.89)                Time: 2951-8841 OT Time Calculation (min): 32 min Charges:  OT General Charges $OT Visit: 1 Visit OT Evaluation $OT Eval Moderate Complexity: 1 Mod OT Treatments $Self Care/Home Management : 8-22 mins  Raegyn Renda L. Wane Mollett, OTR/L  07/05/23, 4:41 PM

## 2023-07-05 NOTE — ED Notes (Signed)
Pt wife given pillow and beverage. Pt denies any immediate needs. Denies pain.  Call light within reach

## 2023-07-05 NOTE — ED Notes (Signed)
OT at bedside. 

## 2023-07-05 NOTE — ED Notes (Signed)
Pt went to V-tach and then AFIB currently is showing in AFIB, called RN Abby and notified. RN is currently in pts room.

## 2023-07-05 NOTE — ED Provider Notes (Signed)
[facility] Provider Note    Event Date/Time   First MD Initiated Contact with Patient 07/05/23 [phone]     (approximate)   History   COVID +   HPI  Edwin [name] is a 82 y.o. male history of CAD, A-fib, hypertension, hyperlipidemia who presents to the emergency department with his wife for concerns for fever and cough.  Symptoms ongoing for almost a week.  No shortness of breath.  No chest pain.  Tested positive for COVID on Monday.   History provided by patient, wife.    Past Medical History:  Diagnosis Date   Allergy    Arthritis    Coronary artery disease    a. 12/2013 PCI: LM nl, LAD 50p, 33m (3.0x28 Alpine Xience DES), D1 90, LCX nl, OM1 70, RCA nl; b. 12/2022 MV: apical-mid inflat ischemia, mildly reduced EF.   Diverticulosis of colon    GERD (gastroesophageal reflux disease)    ESOPHAGEAL REFLUX   Glaucoma    Gout    Hemorrhoids    History of kidney stones    Hx of colonic polyp    Hypercholesterolemia    Hypertension    LVH (left ventricular hypertrophy)    a. 12/2022 Echo: EF 60-65%, no rwma, mod conc LVH. Nl RV fxn. RVSP . Mild to mod LAE. Mild MR/AI.   Schatzki's ring    Seizures (HCC)     Past Surgical History:  Procedure Laterality Date   CARDIAC CATHETERIZATION     COLONOSCOPY WITH PROPOFOL N/A 05/18/2017   Procedure: COLONOSCOPY WITH PROPOFOL;  Surgeon: Scot Jun, MD;  Location: [facility] ENDOSCOPY;  Service: Endoscopy;  Laterality: N/A;   CORONARY ANGIOPLASTY     EYE SURGERY  07/20/13   cataract   HERNIA REPAIR Right 2008 & 2012   VASECTOMY  1984    MEDICATIONS:  Prior to Admission medications   Medication Sig Start Date End Date Taking? Authorizing Provider  apixaban (ELIQUIS) 5 MG TABS tablet Take 1 tablet (5 mg total) by mouth 2 (two) times daily. 12/08/22   Antonieta Iba, MD  aspirin 81 MG tablet Take 81 mg by mouth at bedtime.    [provider]  azelastine (ASTELIN) 0.1 % nasal spray Place 1  spray into both nostrils 2 (two) times daily. 03/19/17   Dale [place], MD  Cholecalciferol (VITAMIN D3) 2000 UNITS TABS Take 2,000 Units by mouth daily.     [provider]  fluticasone (FLONASE) 50 MCG/ACT nasal spray USE 2 SPRAYS IN EACH NOSTRIL EVERY DAY 06/25/22   Dale [place], MD  ketoconazole (NIZORAL) 2 % shampoo Apply topically. 2-3 times weekly 11/21/22   [provider]  latanoprost (XALATAN) 0.005 % ophthalmic solution Place 1 drop into both eyes at bedtime.    [provider]  levETIRAcetam (KEPPRA XR) 500 MG 24 hr tablet Take 1,000 mg by mouth daily. 12/05/20 03/04/23  [provider]  levocetirizine (XYZAL) 5 MG tablet TAKE 1 TABLET EVERY EVENING 11/19/22   Dale [place], MD  MAGNESIUM GLYCINATE PO Take 240 mg by mouth daily.    [provider]  metoprolol succinate (TOPROL XL) 25 MG 24 hr tablet Take 1 tablet (25 mg total) by mouth daily. 11/25/22   Antonieta Iba, MD  Multiple Vitamin (MULTIVITAMIN) tablet Take 1 tablet by mouth daily.    [provider]  Omega-3 Fatty Acids (FISH OIL) 1200 MG CAPS Take 1,200 mg by mouth daily. 01/30/14   [provider]  omeprazole (PRILOSEC) 20 MG capsule TAKE 1 CAPSULE TWICE DAILY BEFORE MEALS 09/17/22   Dale Kimball, MD  polyethylene glycol (MIRALAX / GLYCOLAX) 17 g packet Take 17 g by mouth daily.    [provider]  Psyllium 100 % POWD 1 TSP Daily 09/01/02   [provider]  rosuvastatin (CRESTOR) 40 MG tablet Take 1 tablet (40 mg total) by mouth daily. 06/04/23   Dale , MD  Saw Palmetto 450 MG CAPS Take 450 mg by mouth daily.     [provider]  Zinc 50 MG TABS Take 50 mg by mouth daily. 12/31/18   [provider]    Physical Exam   Triage Vital Signs: ED Triage Vitals  Encounter Vitals Group     BP 07/04/23 2107 107/79     Systolic BP Percentile --      Diastolic BP Percentile --      Pulse Rate 07/04/23 2107 99     Resp  07/04/23 2107 19     Temp 07/04/23 2107 (!) 102.2 F (39 C)     Temp Source 07/04/23 2107 Oral     SpO2 07/04/23 2107 96 %     Weight 07/04/23 2108 170 lb (77.1 kg)     Height 07/04/23 2108 5\' 8"  (1.727 m)     Head Circumference --      Peak Flow --      Pain Score 07/04/23 2108 0     Pain Loc --      Pain Education --      Exclude from Growth Chart --     Most recent vital signs: Vitals:   07/04/23 2230 07/05/23 0242  BP:  118/77  Pulse:  (!) 110  Resp:  20  Temp: 98.8 F (37.1 C) 100.2 F (37.9 C)  SpO2:  95%    CONSTITUTIONAL: Alert, responds appropriately to questions.  Elderly HEAD: Normocephalic, atraumatic EYES: Conjunctivae clear, pupils appear equal, sclera nonicteric ENT: normal nose; moist mucous membranes NECK: Supple, normal ROM CARD: Irregularly irregular and tachycardic; S1 and S2 appreciated RESP: Patient is tachypneic, breath sounds clear and equal bilaterally; no wheezes, no rhonchi, no rales, no hypoxia or respiratory distress, speaking full sentences ABD/GI: Non-distended; soft, non-tender, no rebound, no guarding, no peritoneal signs BACK: The back appears normal EXT: Normal ROM in all joints; no deformity noted, no edema SKIN: Normal color for age and race; warm; no rash on exposed skin NEURO: Moves all extremities equally, normal speech PSYCH: The patient's mood and manner are appropriate.   ED Results / Procedures / Treatments   LABS: (all labs ordered are listed, but only abnormal results are displayed) Labs Reviewed  BASIC METABOLIC PANEL - Abnormal; Notable for the following components:      Result Value   Sodium 133 (*)    CO2 21 (*)    Glucose, Bld 146 (*)    All other components within normal limits  CBC WITH DIFFERENTIAL/PLATELET - Abnormal; Notable for the following components:   WBC 10.7 (*)    Neutro Abs 8.8 (*)    All other components within normal limits  RESP PANEL BY RT-PCR (RSV, FLU A&B, COVID)  RVPGX2  CULTURE, BLOOD  (ROUTINE X 2)  CULTURE, BLOOD (ROUTINE X 2)  PROCALCITONIN  LACTIC ACID, PLASMA  LACTIC ACID, PLASMA  URINALYSIS, W/ REFLEX TO CULTURE (INFECTION SUSPECTED)     EKG:    Date: 07/05/2023 3:49 AM  Rate: 105  Rhythm: Atrial fibrillation  QRS Axis:  normal  Intervals: normal  ST/T Wave abnormalities: normal  Conduction Disutrbances: none  Narrative Interpretation: Atrial fibrillation, right bundle branch block, left anterior fascicular block     RADIOLOGY: My personal review and interpretation of imaging: Chest x-ray shows left-sided pneumonia.  I have personally reviewed all radiology reports.   DG Chest 2 View  Result Date: 07/04/2023 CLINICAL DATA:  Cough, fever, COVID EXAM: CHEST - 2 VIEW COMPARISON:  12/31/2020 FINDINGS: Possible mild subpleural patchy opacity in the left mid lung, equivocal. Right lung is essentially clear. No pleural effusion or pneumothorax. The heart is normal in size. Degenerative changes of the visualized thoracolumbar spine. IMPRESSION: Possible mild subpleural patchy opacity in the left mid lung, equivocal, but presumably reflecting pneumonia in this patient with known COVID. Electronically Signed   By: Charline Bills M.D.   On: 07/04/2023 21:35     PROCEDURES:  Critical Care performed: Yes, see critical care procedure note(s)   CRITICAL CARE Performed by: Baxter Hire Lakashia Collison   Total critical care time: 30 minutes  Critical care time was exclusive of separately billable procedures and treating other patients.  Critical care was necessary to treat or prevent imminent or life-threatening deterioration.  Critical care was time spent personally by me on the following activities: development of treatment plan with patient and/or surrogate as well as nursing, discussions with consultants, evaluation of patient's response to treatment, examination of patient, obtaining history from patient or surrogate, ordering and performing treatments and  interventions, ordering and review of laboratory studies, ordering and review of radiographic studies, pulse oximetry and re-evaluation of patient's condition.   Marland Kitchen1-3 Lead EKG Interpretation  Performed by: Taria Castrillo, Layla Maw, DO Authorized by: Ashante Yellin, Layla Maw, DO     Interpretation: abnormal     ECG rate:  110   ECG rate assessment: tachycardic     Rhythm: sinus tachycardia     Ectopy: none     Conduction: normal       IMPRESSION / MDM / ASSESSMENT AND PLAN / ED COURSE  I reviewed the triage vital signs and the nursing notes.    Patient here with cough, fevers, tachycardia, tachypnea.  Recently tested positive for COVID-19 6 days ago.  The patient is on the cardiac monitor to evaluate for evidence of arrhythmia and/or significant heart rate changes.   DIFFERENTIAL DIAGNOSIS (includes but not limited to):   COVID, viral pneumonia, bacterial pneumonia, CHF, UTI, sepsis   Patient's presentation is most consistent with acute presentation with potential threat to life or bodily function.   PLAN: Patient's labs show leukocytosis of 10.7.  Normal electrolytes.  Will obtain procalcitonin, lactic.  Chest x-ray reviewed and interpreted by myself and the radiologist and shows mid left sided pneumonia.  This may be viral in nature but cannot rule out bacterial superimposed infection.  Will give IV antibiotics.  Will hold IV fluids at this time given normal blood pressures and lactic is pending in the setting of known COVID-19 as IV fluids could make his symptoms worse.  Will obtain cultures, urine.  Patient does meet sepsis criteria.  Will discuss with the hospitalist for admission.  MEDICATIONS GIVEN IN ED: Medications  levofloxacin (LEVAQUIN) IVPB 750 mg (750 mg Intravenous New Bag/Given 07/05/23 0416)  acetaminophen (TYLENOL) tablet 1,000 mg (1,000 mg Oral Given 07/05/23 0413)     ED COURSE: Procalcitonin slightly elevated at 0.31.  Lactic is 1.4.   CONSULTS:  Consulted and discussed  patient's case with hospitalist, Dr. Arville Care.  I have recommended admission  and consulting physician agrees and will place admission orders.  Patient (and family if present) agree with this plan.   I reviewed all nursing notes, vitals, pertinent previous records.  All labs, EKGs, imaging ordered have been independently reviewed and interpreted by myself.    OUTSIDE RECORDS REVIEWED: Reviewed internal medicine visit from 06/29/2023 when patient tested positive for COVID-19.       FINAL CLINICAL IMPRESSION(S) / ED DIAGNOSES   Final diagnoses:  Pneumonia due to COVID-19 virus  Acute sepsis (HCC)     Rx / DC Orders   ED Discharge Orders     None        Note:  This document was prepared using Dragon voice recognition software and may include unintentional dictation errors.   Marvine Encalade, Layla Maw, DO 07/05/23 [phone]

## 2023-07-05 NOTE — ED Notes (Signed)
Called pharmacy about nasal spray. Pharmacy said they will tube them

## 2023-07-05 NOTE — ED Notes (Signed)
PT is not showing on the monitor. RN Abby states currently taking pt to the bathroom.

## 2023-07-05 NOTE — Sepsis Progress Note (Signed)
Following for sepsis monitoring ?

## 2023-07-05 NOTE — Progress Notes (Signed)
CODE SEPSIS - PHARMACY COMMUNICATION  **Broad Spectrum Antibiotics should be administered within 1 hour of Sepsis diagnosis**  Time Code Sepsis Called/Page Received: 0345  Antibiotics Ordered: Levaquin  Time of 1st antibiotic administration: 0416  Otelia Sergeant, PharmD, [facility] 07/05/2023 3:50 AM

## 2023-07-05 NOTE — H&P (Addendum)
History and Physical    Edwin [name]:[id number] DOB: [birth year] DOA: 07/05/2023  PCP: Edwin Wikieup, MD (Confirm with patient/family/NH records and if not entered, this has to be entered at [facility] point of entry) Patient coming from: Home  I have personally briefly reviewed patient's old medical records in [facility]  Chief Complaint: Cough, fever  HPI: Edwin [name] is a 82 y.o. male with medical history significant of PAF on Eliquis, CAD stenting, seizure disorder, HTN, HLD, presented with worsening of cough and fever.  Symptoms started 7 to 8 days ago, patient started to develop a cough nonproductive dry cough, and went to see his doctor 6 days ago, was diagnosed with COVID.  However was told that out of window for Paxlovid and sent home with short course of doxycycline for 7 days, cough medications.  Initially the cough was severe on Monday and Tuesday then became better on Thursday, remained to be a dry cough however Friday patient started to have a fever of 104 and was given Tylenol.  Yesterday fever was 100.4.  Overnight patient developed shortness of breath and family decided to bring him to the hospital.  Denies any chest pains, no abdominal pain no diarrhea no urinary symptoms no rash.  Denied any cough or choking after eating or drinking, no history of aspiration pneumonia  ED Course:  Fever 102.2, tachycardia, tachypneic not hypoxic.  Chest x-ray showed left sided lower lobe pneumonia.  Blood work showed WBC 10.7, hemoglobin 13.9, creatinine 1.0, K4.2.  UA showed WBC 6-10.  COVID negative.  Lactic acid 1.2  He was started on ceftriaxone and azithromycin in the ED.  Review of Systems: As per HPI otherwise 14 point review of systems negative.    Past Medical History:  Diagnosis Date   Allergy    Arthritis    Coronary artery disease    a. 12/2013 PCI: LM nl, LAD 50p, 72m (3.0x28 Alpine Xience DES), D1 90, LCX nl, OM1 70, RCA nl; b. 12/2022 MV: apical-mid inflat  ischemia, mildly reduced EF.   Diverticulosis of colon    GERD (gastroesophageal reflux disease)    ESOPHAGEAL REFLUX   Glaucoma    Gout    Hemorrhoids    History of kidney stones    Hx of colonic polyp    Hypercholesterolemia    Hypertension    LVH (left ventricular hypertrophy)    a. 12/2022 Echo: EF 60-65%, no rwma, mod conc LVH. Nl RV fxn. RVSP . Mild to mod LAE. Mild MR/AI.   Schatzki's ring    Seizures (HCC)     Past Surgical History:  Procedure Laterality Date   CARDIAC CATHETERIZATION     COLONOSCOPY WITH PROPOFOL N/A 05/18/2017   Procedure: COLONOSCOPY WITH PROPOFOL;  Surgeon: Scot Jun, MD;  Location: [facility] ENDOSCOPY;  Service: Endoscopy;  Laterality: N/A;   CORONARY ANGIOPLASTY     EYE SURGERY  07/20/13   cataract   HERNIA REPAIR Right 2008 & 2012   VASECTOMY  1984     reports that he has never smoked. He has never used smokeless tobacco. He reports that he does not drink alcohol and does not use drugs.  Allergies  Allergen Reactions   Ketamine Anxiety and Other (See Comments)   Penicillins Rash   Sulfa Antibiotics Rash    Family History  Problem Relation Age of Onset   Arthritis Mother    Heart disease Mother    Stroke Mother    Hypertension Mother  Arthritis Father    Hyperlipidemia Father    Heart disease Father    Hypertension Father    Parkinson's disease Father    Multiple sclerosis Sister    Colon cancer Maternal Grandmother    Arthritis Paternal Grandmother    Colon cancer Paternal Grandmother      Prior to Admission medications   Medication Sig Start Date End Date Taking? Authorizing Provider  apixaban (ELIQUIS) 5 MG TABS tablet Take 1 tablet (5 mg total) by mouth 2 (two) times daily. 12/08/22  Yes Gollan, Tollie Pizza, MD  azelastine (ASTELIN) 0.1 % nasal spray Place 1 spray into both nostrils 2 (two) times daily. 03/19/17  Yes Edwin [place], MD  fluticasone (FLONASE) 50 MCG/ACT nasal spray USE 2 SPRAYS IN EACH NOSTRIL EVERY  DAY 06/25/22  Yes Edwin [place], MD  latanoprost (XALATAN) 0.005 % ophthalmic solution Place 1 drop into both eyes at bedtime.   Yes [provider]  levETIRAcetam (KEPPRA XR) 500 MG 24 hr tablet Take 1,000 mg by mouth daily. 12/05/20 07/05/23 Yes [provider]  levocetirizine (XYZAL) 5 MG tablet TAKE 1 TABLET EVERY EVENING 11/19/22  Yes Edwin [place], MD  metoprolol succinate (TOPROL XL) 25 MG 24 hr tablet Take 1 tablet (25 mg total) by mouth daily. 11/25/22  Yes Gollan, Tollie Pizza, MD  omeprazole (PRILOSEC) 20 MG capsule TAKE 1 CAPSULE TWICE DAILY BEFORE MEALS Patient taking differently: 40 mg in the morning. 09/17/22  Yes Edwin [place], MD  rosuvastatin (CRESTOR) 40 MG tablet Take 1 tablet (40 mg total) by mouth daily. 06/04/23  Yes Edwin , MD  aspirin 81 MG tablet Take 81 mg by mouth at bedtime. Patient not taking: Reported on 07/05/2023    [provider]  Cholecalciferol (VITAMIN D3) 2000 UNITS TABS Take 2,000 Units by mouth daily.     [provider]  doxycycline (VIBRAMYCIN) 100 MG capsule Take 1 capsule by mouth 2 (two) times daily. Patient not taking: Reported on 07/05/2023 06/29/23 07/06/23  [provider]  ketoconazole (NIZORAL) 2 % shampoo Apply topically. 2-3 times weekly 11/21/22   [provider]  MAGNESIUM GLYCINATE PO Take 240 mg by mouth daily.    [provider]  Multiple Vitamin (MULTIVITAMIN) tablet Take 1 tablet by mouth daily.    [provider]  Omega-3 Fatty Acids (FISH OIL) 1200 MG CAPS Take 1,200 mg by mouth daily. 01/30/14   [provider]  polyethylene glycol (MIRALAX / GLYCOLAX) 17 g packet Take 17 g by mouth daily.    [provider]  Psyllium 100 % POWD 1 TSP Daily 09/01/02   [provider]  Edwin Palmetto 450 MG CAPS Take 450 mg by mouth daily.     [provider]  Zinc 50 MG TABS Take 50 mg by mouth daily. 12/31/18   [provider]    Physical  Exam: Vitals:   07/05/23 0430 07/05/23 0530 07/05/23 0600 07/05/23 0812  BP: 116/75 111/69 102/78   Pulse: 94 95 97   Resp: 17 (!) 23 (!) 21   Temp:    98 F (36.7 C)  TempSrc:    Oral  SpO2: 94% 94% 96%   Weight:      Height:        Constitutional: NAD, calm, comfortable Vitals:   07/05/23 0430 07/05/23 0530 07/05/23 0600 07/05/23 0812  BP: 116/75 111/69 102/78   Pulse: 94 95 97   Resp: 17 (!) 23 (!) 21   Temp:  98 F (36.7 C)  TempSrc:    Oral  SpO2: 94% 94% 96%   Weight:      Height:       Eyes: PERRL, lids and conjunctivae normal ENMT: Mucous membranes are moist. Posterior pharynx clear of any exudate or lesions.Normal dentition.  Neck: normal, supple, no masses, no thyromegaly Respiratory: clear to auscultation bilaterally, no wheezing, left-sided crackles, increasing respiratory effort. No accessory muscle use.  Cardiovascular: Regular rate and rhythm, no murmurs / rubs / gallops. No extremity edema. 2+ pedal pulses. No carotid bruits.  Abdomen: no tenderness, no masses palpated. No hepatosplenomegaly. Bowel sounds positive.  Musculoskeletal: no clubbing / cyanosis. No joint deformity upper and lower extremities. Good ROM, no contractures. Normal muscle tone.  Skin: no rashes, lesions, ulcers. No induration Neurologic: CN 2-12 grossly intact. Sensation intact, DTR normal. Strength 5/5 in all 4.  Psychiatric: Normal judgment and insight. Alert and oriented x 3. Normal mood.     Labs on Admission: I have personally reviewed following labs and imaging studies  CBC: Recent Labs  Lab 07/04/23 2229  WBC 10.7*  NEUTROABS 8.8*  HGB 13.9  HCT 40.5  MCV 84.9  PLT 196   Basic Metabolic Panel: Recent Labs  Lab 07/04/23 2229  NA 133*  K 4.2  CL 102  CO2 21*  GLUCOSE 146*  BUN 22  CREATININE 1.02  CALCIUM 9.1   GFR: Estimated Creatinine Clearance: 54 mL/min (by C-G formula based on SCr of 1.02 mg/dL). Liver Function Tests: No results for input(s): "AST",  "ALT", "ALKPHOS", "BILITOT", "PROT", "ALBUMIN" in the last 168 hours. No results for input(s): "LIPASE", "AMYLASE" in the last 168 hours. No results for input(s): "AMMONIA" in the last 168 hours. Coagulation Profile: No results for input(s): "INR", "PROTIME" in the last 168 hours. Cardiac Enzymes: No results for input(s): "CKTOTAL", "CKMB", "CKMBINDEX", "TROPONINI" in the last 168 hours. BNP (last 3 results) No results for input(s): "PROBNP" in the last 8760 hours. HbA1C: No results for input(s): "HGBA1C" in the last 72 hours. CBG: No results for input(s): "GLUCAP" in the last 168 hours. Lipid Profile: No results for input(s): "CHOL", "HDL", "LDLCALC", "TRIG", "CHOLHDL", "LDLDIRECT" in the last 72 hours. Thyroid Function Tests: No results for input(s): "TSH", "T4TOTAL", "FREET4", "T3FREE", "THYROIDAB" in the last 72 hours. Anemia Panel: No results for input(s): "VITAMINB12", "FOLATE", "FERRITIN", "TIBC", "IRON", "RETICCTPCT" in the last 72 hours. Urine analysis:    Component Value Date/Time   COLORURINE AMBER (A) 07/05/2023 0612   APPEARANCEUR CLOUDY (A) 07/05/2023 0612   LABSPEC 1.034 (H) 07/05/2023 0612   PHURINE 5.0 07/05/2023 0612   GLUCOSEU NEGATIVE 07/05/2023 0612   GLUCOSEU NEGATIVE 04/06/2014 1500   HGBUR NEGATIVE 07/05/2023 0612   BILIRUBINUR NEGATIVE 07/05/2023 0612   KETONESUR 20 (A) 07/05/2023 0612   PROTEINUR 100 (A) 07/05/2023 0612   UROBILINOGEN 0.2 04/06/2014 1500   NITRITE NEGATIVE 07/05/2023 0612   LEUKOCYTESUR NEGATIVE 07/05/2023 0612    Radiological Exams on Admission: DG Chest 2 View  Result Date: 07/04/2023 CLINICAL DATA:  Cough, fever, COVID EXAM: CHEST - 2 VIEW COMPARISON:  12/31/2020 FINDINGS: Possible mild subpleural patchy opacity in the left mid lung, equivocal. Right lung is essentially clear. No pleural effusion or pneumothorax. The heart is normal in size. Degenerative changes of the visualized thoracolumbar spine. IMPRESSION: Possible mild  subpleural patchy opacity in the left mid lung, equivocal, but presumably reflecting pneumonia in this patient with known COVID. Electronically Signed   By: Roselie Awkward.D.  On: 07/04/2023 21:35    EKG: Independently reviewed.  A-fib rate controlled, chronic RBBB  Assessment/Plan Principal Problem:   Community acquired pneumonia Active Problems:   Pneumonia  (please populate well all problems here in Problem List. (For example, if patient is on BP meds at home and you resume or decide to hold them, it is a problem that needs to be her. Same for CAD, COPD, HLD and so on)  CAP, bacterial, failed outpatient management -Postviral pneumonia -Continue ceftriaxone and azithromycin -Incentive spirometry and flutter valve -Atypical pneumonia workup, Legionella and mycoplasma -Repeat chest x-ray in 4 weeks to document resolution  COVID-19 infection -Out of window for antiviral treatment, clinically less likely to contribute to his breathing problems -On Eliquis, low suspicion for PE.  SIRS -With new onset of fever. -2/2 PNA and management as above  PAF -Rate controlled A-fib, continue metoprolol -Continue Eliquis  Seizure disorder -No acute concern, continue Keppra  CAD -No acute concern, outpatient follow-up with cardiology  Deconditioning -PT evaluation  DVT prophylaxis: Eliquis Code Status: Full code Family Communication: Wife at bedside Disposition Plan: Patient is sick with pneumonia failed outpatient management, requiring IV antibiotics, expect more than 2 midnight hospital stay Consults called: None Admission status: Tele admit   Emeline General MD [facility] Pager [phone]  07/05/2023, 9:56 AM

## 2023-07-05 NOTE — ED Notes (Signed)
Pt ambulated to bedside toilet. Pt is notably more wobbly. Gate not as steady as previous encounter to restroom. Pt required one person assist.

## 2023-07-06 ENCOUNTER — Encounter: Payer: Self-pay | Admitting: Internal Medicine

## 2023-07-06 ENCOUNTER — Inpatient Hospital Stay: Payer: Medicare HMO

## 2023-07-06 DIAGNOSIS — J189 Pneumonia, unspecified organism: Secondary | ICD-10-CM | POA: Diagnosis not present

## 2023-07-06 LAB — CBC
HCT: 35.8 % — ABNORMAL LOW (ref 39.0–52.0)
Hemoglobin: 12.3 g/dL — ABNORMAL LOW (ref 13.0–17.0)
MCH: 28.7 pg (ref 26.0–34.0)
MCHC: 34.4 g/dL (ref 30.0–36.0)
MCV: 83.4 fL (ref 80.0–100.0)
Platelets: 203 10*3/uL (ref 150–400)
RBC: 4.29 MIL/uL (ref 4.22–5.81)
RDW: 13.5 % (ref 11.5–15.5)
WBC: 11.3 10*3/uL — ABNORMAL HIGH (ref 4.0–10.5)
nRBC: 0 % (ref 0.0–0.2)

## 2023-07-06 LAB — BASIC METABOLIC PANEL
Anion gap: 9 (ref 5–15)
BUN: 24 mg/dL — ABNORMAL HIGH (ref 8–23)
CO2: 24 mmol/L (ref 22–32)
Calcium: 8.6 mg/dL — ABNORMAL LOW (ref 8.9–10.3)
Chloride: 99 mmol/L (ref 98–111)
Creatinine, Ser: 1.23 mg/dL (ref 0.61–1.24)
GFR, Estimated: 59 mL/min — ABNORMAL LOW (ref >60)
Glucose, Bld: 118 mg/dL — ABNORMAL HIGH (ref 70–99)
Potassium: 4.1 mmol/L (ref 3.5–5.1)
Sodium: 132 mmol/L — ABNORMAL LOW (ref 135–145)

## 2023-07-06 MED ORDER — DEXTROSE 5 % IV SOLN
500.0000 mg | INTRAVENOUS | Status: DC
  Administered 2023-07-06: 500 mg via INTRAVENOUS
  Filled 2023-07-06: qty 5

## 2023-07-06 MED ORDER — SODIUM CHLORIDE 0.9 % IV SOLN
500.0000 mg | INTRAVENOUS | Status: DC
  Administered 2023-07-07 – 2023-07-10 (×4): 500 mg via INTRAVENOUS
  Filled 2023-07-06 (×5): qty 5
  Filled 2023-07-06: qty 510

## 2023-07-06 MED ORDER — VITAMIN D 25 MCG (1000 UNIT) PO TABS
1000.0000 [IU] | ORAL_TABLET | Freq: Every day | ORAL | Status: DC
  Administered 2023-07-06 – 2023-07-11 (×6): 1000 [IU] via ORAL
  Filled 2023-07-06 (×6): qty 1

## 2023-07-06 MED ORDER — HYDROCOD POLI-CHLORPHE POLI ER 10-8 MG/5ML PO SUER
5.0000 mL | Freq: Two times a day (BID) | ORAL | Status: DC | PRN
  Administered 2023-07-06 – 2023-07-10 (×2): 5 mL via ORAL
  Filled 2023-07-06 (×2): qty 5

## 2023-07-06 MED ORDER — SODIUM CHLORIDE 0.9 % IV SOLN
1.0000 g | INTRAVENOUS | Status: DC
  Administered 2023-07-06 – 2023-07-10 (×5): 1 g via INTRAVENOUS
  Filled 2023-07-06 (×6): qty 10

## 2023-07-06 NOTE — Progress Notes (Signed)
Transition of Care [facility]) - Inpatient Brief Assessment   Patient Details  Name: Edwin [name] MRN: [id number] Date of Birth: [birth year]  Transition of Care [facility]) CM/SW Contact:    Darolyn Rua, LCSW Phone Number: 07/06/2023, 10:12 AM   Clinical Narrative:  Patient from home wit wife, presented to ED with cough and fever. Hx of CAD, afib, tested positive for COVID on 10/28:  PCP: Dr. Dale [place] Insurance: [facility] Medicare  No TOC needs at this time please consult TOC should discharge planning needs arise.   Transition of Care Asessment: Insurance and Status: Insurance coverage has been reviewed Patient has primary care physician: Yes Home environment has been reviewed: from home with wife Prior level of function:: independent Prior/Current Home Services: No current home services Social Determinants of Health Reivew: SDOH reviewed no interventions necessary Readmission risk has been reviewed: Yes Transition of care needs: no transition of care needs at this time

## 2023-07-06 NOTE — Progress Notes (Signed)
PROGRESS NOTE    COPELAN MAULTSBY  HKV:[id number] DOB: Jan 20, 1941 DOA: 07/05/2023 PCP: Dale [place], MD    Assessment & Plan:   Principal Problem:   Community acquired pneumonia Active Problems:   Pneumonia  Assessment and Plan: CAP: bacterial, post-viral pneumonia. Failed outpatient management. Continue on IV rocephin, azithromycin, bronchodilators & encourage incentive spirometry. Mycoplasma, legionella are pending.    COVID-19 infection: out of window for antiviral treatment. Continue w/ supportive care. Continue on airborne & contact precautions    SIRS: secondary to pneumonia.    PAF: continue on metoprolol, eliquis   Seizure disorder: continue on home dose of keppra   Hx of CAD: no CP. Continue on home dose of metoprolol    Deconditioning: PT/OT consulted   Vitamin D deficiency: continue on vit D supplement      DVT prophylaxis: eliquis  Code Status: full  Family Communication: discussed pt's care w/ pt's family at bedside and answered their questions  Disposition Plan: depends on PT/OT recs   Level of care: Telemetry Medical  Status is: Inpatient Remains inpatient appropriate because: severity of illness    Consultants:    Procedures:   Antimicrobials:rocephin, azithromycin    Subjective: Pt c/o cough   Objective: Vitals:   07/06/23 0233 07/06/23 0430 07/06/23 0445 07/06/23 0700  BP:  111/66  111/79  Pulse:   89 94  Resp:  (!) 22  (!) 21  Temp:      TempSrc:      SpO2: 94% 94%  96%  Weight:      Height:       No intake or output data in the 24 hours ending 07/06/23 0858 Filed Weights   07/04/23 2108  Weight: 77.1 kg    Examination:  General exam: Appears calm and comfortable  Respiratory system: diminished breath sounds b/l Cardiovascular system: S1 & S2 +. No  rubs, gallops or clicks.  Gastrointestinal system: Abdomen is nondistended, soft and nontender. Normal bowel sounds heard. Central nervous system: Alert and oriented.  Moves all extremities  Psychiatry: Judgement and insight appear normal. Flat mood and affect.     Data Reviewed: I have personally reviewed following labs and imaging studies  CBC: Recent Labs  Lab 07/04/23 2229 07/06/23 0528  WBC 10.7* 11.3*  NEUTROABS 8.8*  --   HGB 13.9 12.3*  HCT 40.5 35.8*  MCV 84.9 83.4  PLT 196 203   Basic Metabolic Panel: Recent Labs  Lab 07/04/23 2229 07/06/23 0528  NA 133* 132*  K 4.2 4.1  CL 102 99  CO2 21* 24  GLUCOSE 146* 118*  BUN 22 24*  CREATININE 1.02 1.23  CALCIUM 9.1 8.6*   GFR: Estimated Creatinine Clearance: 44.8 mL/min (by C-G formula based on SCr of 1.23 mg/dL). Liver Function Tests: No results for input(s): "AST", "ALT", "ALKPHOS", "BILITOT", "PROT", "ALBUMIN" in the last 168 hours. No results for input(s): "LIPASE", "AMYLASE" in the last 168 hours. No results for input(s): "AMMONIA" in the last 168 hours. Coagulation Profile: No results for input(s): "INR", "PROTIME" in the last 168 hours. Cardiac Enzymes: No results for input(s): "CKTOTAL", "CKMB", "CKMBINDEX", "TROPONINI" in the last 168 hours. BNP (last 3 results) No results for input(s): "PROBNP" in the last 8760 hours. HbA1C: No results for input(s): "HGBA1C" in the last 72 hours. CBG: No results for input(s): "GLUCAP" in the last 168 hours. Lipid Profile: No results for input(s): "CHOL", "HDL", "LDLCALC", "TRIG", "CHOLHDL", "LDLDIRECT" in the last 72 hours. Thyroid Function Tests: No results for  input(s): "TSH", "T4TOTAL", "FREET4", "T3FREE", "THYROIDAB" in the last 72 hours. Anemia Panel: No results for input(s): "VITAMINB12", "FOLATE", "FERRITIN", "TIBC", "IRON", "RETICCTPCT" in the last 72 hours. Sepsis Labs: Recent Labs  Lab 07/04/23 2227 07/05/23 0401  PROCALCITON 0.31  --   LATICACIDVEN  --  1.4    Recent Results (from the past 240 hour(s))  Resp panel by RT-PCR (RSV, Flu A&B, Covid) Anterior Nasal Swab     Status: Abnormal   Collection Time:  07/05/23  4:01 AM   Specimen: Anterior Nasal Swab  Result Value Ref Range Status   SARS Coronavirus 2 by RT PCR POSITIVE (A) NEGATIVE Final    Comment: (NOTE) SARS-CoV-2 target nucleic acids are DETECTED.  The SARS-CoV-2 RNA is generally detectable in upper respiratory specimens during the acute phase of infection. Positive results are indicative of the presence of the identified virus, but do not rule out bacterial infection or co-infection with other pathogens not detected by the test. Clinical correlation with patient history and other diagnostic information is necessary to determine patient infection status. The expected result is Negative.  Fact Sheet for Patients: BloggerCourse.com  Fact Sheet for Healthcare Providers: SeriousBroker.it  This test is not yet approved or cleared by the [place] FDA and  has been authorized for detection and/or diagnosis of SARS-CoV-2 by FDA under an Emergency Use Authorization (EUA).  This EUA will remain in effect (meaning this test can be used) for the duration of  the COVID-19 declaration under Section 564(b)(1) of the A ct, 21 U.S.C. section 360bbb-3(b)(1), unless the authorization is terminated or revoked sooner.     Influenza A by PCR NEGATIVE NEGATIVE Final   Influenza B by PCR NEGATIVE NEGATIVE Final    Comment: (NOTE) The Xpert Xpress SARS-CoV-2/FLU/RSV plus assay is intended as an aid in the diagnosis of influenza from Nasopharyngeal swab specimens and should not be used as a sole basis for treatment. Nasal washings and aspirates are unacceptable for Xpert Xpress SARS-CoV-2/FLU/RSV testing.  Fact Sheet for Patients: BloggerCourse.com  Fact Sheet for Healthcare Providers: SeriousBroker.it  This test is not yet approved or cleared by the [place] FDA and has been authorized for detection and/or diagnosis of SARS-CoV-2  by FDA under an Emergency Use Authorization (EUA). This EUA will remain in effect (meaning this test can be used) for the duration of the COVID-19 declaration under Section 564(b)(1) of the Act, 21 U.S.C. section 360bbb-3(b)(1), unless the authorization is terminated or revoked.     Resp Syncytial Virus by PCR NEGATIVE NEGATIVE Final    Comment: (NOTE) Fact Sheet for Patients: BloggerCourse.com  Fact Sheet for Healthcare Providers: SeriousBroker.it  This test is not yet approved or cleared by the [place] FDA and has been authorized for detection and/or diagnosis of SARS-CoV-2 by FDA under an Emergency Use Authorization (EUA). This EUA will remain in effect (meaning this test can be used) for the duration of the COVID-19 declaration under Section 564(b)(1) of the Act, 21 U.S.C. section 360bbb-3(b)(1), unless the authorization is terminated or revoked.  Performed at [facility], [address] [place], [place] [address]   Blood Culture (routine x 2)     Status: None (Preliminary result)   Collection Time: 07/05/23  4:01 AM   Specimen: BLOOD  Result Value Ref Range Status   Specimen Description BLOOD RIGHT ANTECUBITAL  Final   Special Requests   Final    BOTTLES DRAWN AEROBIC AND ANAEROBIC Blood Culture adequate volume   Culture   Final  NO GROWTH 1 DAY Performed at [facility], [address] [place], [place] [address]    Report Status PENDING  Incomplete  Blood Culture (routine x 2)     Status: None (Preliminary result)   Collection Time: 07/05/23  4:01 AM   Specimen: BLOOD  Result Value Ref Range Status   Specimen Description BLOOD BLOOD RIGHT FOREARM  Final   Special Requests   Final    BOTTLES DRAWN AEROBIC AND ANAEROBIC Blood Culture adequate volume   Culture   Final    NO GROWTH 1 DAY Performed at [facility], [address], [place], [place] [address]    Report Status  PENDING  Incomplete         Radiology Studies: DG Chest 1 View  Result Date: 07/06/2023 CLINICAL DATA:  [id number].  Pneumonia follow-up. EXAM: CHEST  1 VIEW COMPARISON:  PA and lateral 07/04/2023. FINDINGS: 6:47 a.m. Cardiomegaly. Mild increased central vascular prominence without overt edema. There is increased patchy hazy airspace disease left mid to lower lung field, probably in the lower lobe, and scattered opacities newly noted in the right lower lung field most likely new areas of pneumonia. There is no substantial pleural effusion. The remaining lungs are clear. Stable mediastinum with mild aortic tortuosity and atherosclerosis. No new osseous abnormality. IMPRESSION: 1. Increased patchy hazy airspace disease left mid to lower lung field, probably in the lower lobe, and scattered opacities newly noted in the right lower lung field most likely new areas of pneumonia. 2. Cardiomegaly with mild increased central vascular prominence without overt edema. Electronically Signed   By: Almira Bar M.D.   On: 07/06/2023 07:02   DG Chest 2 View  Result Date: 07/04/2023 CLINICAL DATA:  Cough, fever, COVID EXAM: CHEST - 2 VIEW COMPARISON:  12/31/2020 FINDINGS: Possible mild subpleural patchy opacity in the left mid lung, equivocal. Right lung is essentially clear. No pleural effusion or pneumothorax. The heart is normal in size. Degenerative changes of the visualized thoracolumbar spine. IMPRESSION: Possible mild subpleural patchy opacity in the left mid lung, equivocal, but presumably reflecting pneumonia in this patient with known COVID. Electronically Signed   By: Charline Bills M.D.   On: 07/04/2023 21:35        Scheduled Meds:  apixaban  5 mg Oral BID   azelastine  1 spray Each Nare BID   fluticasone  2 spray Each Nare Daily   guaiFENesin  1,200 mg Oral BID   ipratropium-albuterol  3 mL Nebulization Q6H   ketoconazole   Topical Once per day on Monday Thursday   latanoprost  1 drop Both  Eyes QHS   levETIRAcetam  1,000 mg Oral Daily   loratadine  10 mg Oral Daily   metoprolol succinate  25 mg Oral Daily   pantoprazole  40 mg Oral Daily   polyethylene glycol  17 g Oral Daily   psyllium  1 packet Oral Daily   rosuvastatin  40 mg Oral Daily   Continuous Infusions:   LOS: 1 day       Charise Killian, MD [facility] Pager [phone]  If 7PM-7AM, please contact night-coverage [email or web address] 07/06/2023, 8:58 AM

## 2023-07-06 NOTE — ED Notes (Signed)
Pt voided 200 mL in urinal with RN assistance, pt wife at bedside anxious. RN educated pt and wife that pt should not stand if he is not strong enough at this time, pt verbalized understanding and refused to do so, RN assisted pt to stand.

## 2023-07-06 NOTE — Evaluation (Signed)
Physical Therapy Evaluation Patient Details Name: Edwin [name] MRN: [id number] DOB: [birth year] Today's Date: 07/06/2023  History of Present Illness  Edwin [name] is a 82 y.o. male with medical history significant of PAF on Eliquis, CAD stenting, seizure disorder, HTN, HLD, recent COVID+, presented with worsening of cough and fever. Chest x-ray showed left sided lower lobe pneumonia.   Clinical Impression  Pt received in Semi-Fowler's position and agreeable to therapy.  Pt accompanied by spouse and son upon entry into the room.  Pt noted he is still weak, but is willing to ambulate with therapy.  Pt typically independent at home without an AD.  Pt attempted to perform transfers and was initially unsteady upon standing, so therapist gave pt RW to continue with.  Pt with increased difficulty performing sharp turns as well.  Throughout ambulation around the ED, pt noted to have considerable veer to the L, running into multiple items while therapist maintained CGA.  Pt then returned to bed without any formal complaints other than being fatigued.  Pt left with spouse and son and all other needs met.  Discussed d/c options with family and patient and family agreeable for some assistance once d/c home.          If plan is discharge home, recommend the following: A little help with walking and/or transfers;Assist for transportation;Help with stairs or ramp for entrance   Can travel by private vehicle        Equipment Recommendations Rolling walker (2 wheels)  Recommendations for Other Services       Functional Status Assessment Patient has had a recent decline in their functional status and demonstrates the ability to make significant improvements in function in a reasonable and predictable amount of time.     Precautions / Restrictions Precautions Precautions: Fall Restrictions Weight Bearing Restrictions: No      Mobility  Bed Mobility Overal bed mobility: Needs Assistance Bed  Mobility: Supine to Sit, Sit to Supine     Supine to sit: Contact guard Sit to supine: Contact guard assist   General bed mobility comments: increased time, cues to scoot forward on gurney    Transfers Overall transfer level: Needs assistance Equipment used: Rolling walker (2 wheels) Transfers: Sit to/from Stand Sit to Stand: Contact guard assist           General transfer comment: Pt able to stand without AD initially, however noted to have some unsteadiness and was given RW.  Pt much more stable with AD.    Ambulation/Gait Ambulation/Gait assistance: Contact guard assist Gait Distance (Feet): 300 Feet Assistive device: Rolling walker (2 wheels) Gait Pattern/deviations: Step-through pattern, Drifts right/left Gait velocity: decreased     General Gait Details: Pt with initial unsteadiness with turn out of the room and required minA from therapist to correct.  Pt then tends to veer to the L with ambulation, running into multiple items on the L.  Stairs            Wheelchair Mobility     Tilt Bed    Modified Rankin (Stroke Patients Only)       Balance Overall balance assessment: Needs assistance Sitting-balance support: Feet unsupported, Single extremity supported Sitting balance-Leahy Scale: Good     Standing balance support: Bilateral upper extremity supported, During functional activity Standing balance-Leahy Scale: Fair Standing balance comment: Pt able to ambulate aroudn the ED without any complication.  Pertinent Vitals/Pain Pain Assessment Pain Assessment: No/denies pain    Home Living Family/patient expects to be discharged to:: Private residence Living Arrangements: Spouse/significant other Available Help at Discharge: Available 24 hours/day;Family Type of Home: House Home Access: Stairs to enter Entrance Stairs-Rails: Can reach both Entrance Stairs-Number of Steps: 2   Home Layout: Two level;Able  to live on main level with bedroom/bathroom Home Equipment: Cane - single point      Prior Function Prior Level of Function : Independent/Modified Independent;Driving             Mobility Comments: IND, no AD, no falls in the past year ADLs Comments: IND, driving     Extremity/Trunk Assessment   Upper Extremity Assessment Upper Extremity Assessment: Overall WFL for tasks assessed    Lower Extremity Assessment Lower Extremity Assessment: Overall WFL for tasks assessed       Communication   Communication Communication: No apparent difficulties  Cognition Arousal: Alert Behavior During Therapy: WFL for tasks assessed/performed Overall Cognitive Status: Within Functional Limits for tasks assessed                                          General Comments      Exercises Total Joint Exercises Ankle Circles/Pumps: AROM, Strengthening, Both, 10 reps, Seated Long Arc Quad: AROM, Strengthening, Both, 10 reps, Seated Marching in Standing: AROM, Strengthening, Both, 15 reps, Seated   Assessment/Plan    PT Assessment Patient needs continued PT services  PT Problem List Decreased strength;Decreased activity tolerance;Decreased balance;Decreased mobility;Decreased knowledge of use of DME;Decreased safety awareness       PT Treatment Interventions DME instruction;Gait training;Stair training;Functional mobility training;Therapeutic activities;Therapeutic exercise;Balance training;Neuromuscular re-education    PT Goals (Current goals can be found in the Care Plan section)  Acute Rehab PT Goals Patient Stated Goal: to go back home. PT Goal Formulation: With patient Time For Goal Achievement: 07/20/23 Potential to Achieve Goals: Good    Frequency Min 1X/week     Co-evaluation               AM-PAC PT "6 Clicks" Mobility  Outcome Measure Help needed turning from your back to your side while in a flat bed without using bedrails?: None Help needed  moving from lying on your back to sitting on the side of a flat bed without using bedrails?: None Help needed moving to and from a bed to a chair (including a wheelchair)?: None Help needed standing up from a chair using your arms (e.g., wheelchair or bedside chair)?: A Little Help needed to walk in hospital room?: A Little Help needed climbing 3-5 steps with a railing? : A Little 6 Click Score: 21    End of Session Equipment Utilized During Treatment: Gait belt Activity Tolerance: Patient tolerated treatment well Patient left: in bed;with call bell/phone within reach;with family/visitor present Nurse Communication: Mobility status PT Visit Diagnosis: Unsteadiness on feet (R26.81);Muscle weakness (generalized) (M62.81);Difficulty in walking, not elsewhere classified (R26.2)    Time: 4132-4401 PT Time Calculation (min) (ACUTE ONLY): 26 min   Charges:   PT Evaluation $PT Eval Low Complexity: 1 Low PT Treatments $Therapeutic Activity: 8-22 mins PT General Charges $$ ACUTE PT VISIT: 1 Visit         Nolon Bussing, PT, DPT Physical Therapist - [facility]  07/06/23, 1:51 PM

## 2023-07-07 DIAGNOSIS — J189 Pneumonia, unspecified organism: Secondary | ICD-10-CM | POA: Diagnosis not present

## 2023-07-07 LAB — CBC
HCT: 41.8 % (ref 39.0–52.0)
Hemoglobin: 13.9 g/dL (ref 13.0–17.0)
MCH: 29 pg (ref 26.0–34.0)
MCHC: 33.3 g/dL (ref 30.0–36.0)
MCV: 87.3 fL (ref 80.0–100.0)
Platelets: 251 10*3/uL (ref 150–400)
RBC: 4.79 MIL/uL (ref 4.22–5.81)
RDW: 13.6 % (ref 11.5–15.5)
WBC: 12.9 10*3/uL — ABNORMAL HIGH (ref 4.0–10.5)
nRBC: 0 % (ref 0.0–0.2)

## 2023-07-07 LAB — BASIC METABOLIC PANEL
Anion gap: 12 (ref 5–15)
BUN: 23 mg/dL (ref 8–23)
CO2: 22 mmol/L (ref 22–32)
Calcium: 8.8 mg/dL — ABNORMAL LOW (ref 8.9–10.3)
Chloride: 97 mmol/L — ABNORMAL LOW (ref 98–111)
Creatinine, Ser: 1.23 mg/dL (ref 0.61–1.24)
GFR, Estimated: 59 mL/min — ABNORMAL LOW (ref >60)
Glucose, Bld: 105 mg/dL — ABNORMAL HIGH (ref 70–99)
Potassium: 4 mmol/L (ref 3.5–5.1)
Sodium: 131 mmol/L — ABNORMAL LOW (ref 135–145)

## 2023-07-07 MED ORDER — IPRATROPIUM-ALBUTEROL 20-100 MCG/ACT IN AERS
1.0000 | INHALATION_SPRAY | Freq: Four times a day (QID) | RESPIRATORY_TRACT | Status: DC
Start: 2023-07-07 — End: 2023-07-11
  Administered 2023-07-07 – 2023-07-11 (×16): 1 via RESPIRATORY_TRACT
  Filled 2023-07-07: qty 4

## 2023-07-07 NOTE — Plan of Care (Addendum)
Pt admitted today from ED via bed. He is alert and oriented X 4. He had diminished lung sound and non productive cough.he has been getting a I/v abx and combivent puff as order. Denies any pain.  Problem: Education: Goal: Knowledge of risk factors and measures for prevention of condition will improve Outcome: Progressing   Problem: Coping: Goal: Psychosocial and spiritual needs will be supported Outcome: Progressing   Problem: Respiratory: Goal: Will maintain a patent airway Outcome: Progressing Goal: Complications related to the disease process, condition or treatment will be avoided or minimized Outcome: Progressing   Problem: Education: Goal: Knowledge of General Education information will improve Description: Including pain rating scale, medication(s)/side effects and non-pharmacologic comfort measures Outcome: Progressing   Problem: Health Behavior/Discharge Planning: Goal: Ability to manage health-related needs will improve Outcome: Progressing   Problem: Clinical Measurements: Goal: Ability to maintain clinical measurements within normal limits will improve Outcome: Progressing Goal: Will remain free from infection Outcome: Progressing Goal: Diagnostic test results will improve Outcome: Progressing Goal: Respiratory complications will improve Outcome: Progressing Goal: Cardiovascular complication will be avoided Outcome: Progressing   Problem: Activity: Goal: Risk for activity intolerance will decrease Outcome: Progressing   Problem: Nutrition: Goal: Adequate nutrition will be maintained Outcome: Progressing   Problem: Coping: Goal: Level of anxiety will decrease Outcome: Progressing   Problem: Elimination: Goal: Will not experience complications related to bowel motility Outcome: Progressing Goal: Will not experience complications related to urinary retention Outcome: Progressing   Problem: Pain Management: Goal: General experience of comfort will  improve Outcome: Progressing   Problem: Safety: Goal: Ability to remain free from injury will improve Outcome: Progressing   Problem: Skin Integrity: Goal: Risk for impaired skin integrity will decrease Outcome: Progressing

## 2023-07-07 NOTE — Progress Notes (Signed)
PROGRESS NOTE   HPI was taken from Dr. Chipper Herb: Edwin [name] is a 82 y.o. male with medical history significant of PAF on Eliquis, CAD stenting, seizure disorder, HTN, HLD, presented with worsening of cough and fever.   Symptoms started 7 to 8 days ago, patient started to develop a cough nonproductive dry cough, and went to see his doctor 6 days ago, was diagnosed with COVID.  However was told that out of window for Paxlovid and sent home with short course of doxycycline for 7 days, cough medications.  Initially the cough was severe on Monday and Tuesday then became better on Thursday, remained to be a dry cough however Friday patient started to have a fever of 104 and was given Tylenol.  Yesterday fever was 100.4.  Overnight patient developed shortness of breath and family decided to bring him to the hospital.  Denies any chest pains, no abdominal pain no diarrhea no urinary symptoms no rash.  Denied any cough or choking after eating or drinking, no history of aspiration pneumonia   ED Course:  Fever 102.2, tachycardia, tachypneic not hypoxic.  Chest x-ray showed left sided lower lobe pneumonia.  Blood work showed WBC 10.7, hemoglobin 13.9, creatinine 1.0, K4.2.  UA showed WBC 6-10.  COVID negative.  Lactic acid 1.2   He was started on ceftriaxone and azithromycin in the ED.     Edwin [name]  ZHY:[id number] DOB: 1940-11-29 DOA: 07/05/2023 PCP: [place], MD    Assessment & Plan:   Principal Problem:   Community acquired pneumonia Active Problems:   Pneumonia  Assessment and Plan: CAP: bacterial, post-viral pneumonia. Failed outpatient management. Continue on IV rocephin, azithromycin, bronchodilators & encourage incentive spirometry. Legionella, mycoplasma are pending still    COVID-19 infection: out of window for antiviral treatment. Continue w/ supportive care. Continue on airborne & contact precautions    SIRS: secondary to pneumonia.    PAF: continue on  metoprolol, eliquis   Seizure disorder: continue on home dose of keppra   Hx of CAD: no CP. Continue on home dose of metoprolol    Deconditioning: PT recs HH. OT recs SNF   Vitamin D deficiency: continue on vit D supplement      DVT prophylaxis: eliquis  Code Status: full  Family Communication: discussed pt's care w/ pt's family at bedside and answered their questions  Disposition Plan: likely d/c home w/ home health   Level of care: Telemetry Medical  Status is: Inpatient Remains inpatient appropriate because: severity of illness. Can likely d/c home in 24-28 hrs     Consultants:    Procedures:   Antimicrobials: rocephin, azithromycin    Subjective: Pt c/o malaise & cough   Objective: Vitals:   07/07/23 0400 07/07/23 0600 07/07/23 0834 07/07/23 0855  BP: 106/87 117/68  124/84  Pulse: 82 93  (!) 104  Resp: 20 (!) 23  15  Temp: 98.5 F (36.9 C)  100.2 F (37.9 C)   TempSrc:   Oral   SpO2: 95% 93%  95%  Weight:      Height:        Intake/Output Summary (Last 24 hours) at 07/07/2023 1238 Last data filed at 07/06/2023 1330 Gross per 24 hour  Intake 250 ml  Output --  Net 250 ml   Filed Weights   07/04/23 2108  Weight: 77.1 kg    Examination:  General exam: Appears uncomfortable  Respiratory system: decreased breath sounds b/l  Cardiovascular system: S1/S2+. No rubs or clicks Gastrointestinal  system: Abd is soft, NT, ND & hypoactive bowel sounds  Central nervous system: alert & oriented. Moves all extremities   Psychiatry: Judgement and insight appears at baseline. Flat mood and affect    Data Reviewed: I have personally reviewed following labs and imaging studies  CBC: Recent Labs  Lab 07/04/23 2229 07/06/23 0528 07/07/23 0415  WBC 10.7* 11.3* 12.9*  NEUTROABS 8.8*  --   --   HGB 13.9 12.3* 13.9  HCT 40.5 35.8* 41.8  MCV 84.9 83.4 87.3  PLT 196 203 251   Basic Metabolic Panel: Recent Labs  Lab 07/04/23 2229 07/06/23 0528  07/07/23 0415  NA 133* 132* 131*  K 4.2 4.1 4.0  CL 102 99 97*  CO2 21* 24 22  GLUCOSE 146* 118* 105*  BUN 22 24* 23  CREATININE 1.02 1.23 1.23  CALCIUM 9.1 8.6* 8.8*   GFR: Estimated Creatinine Clearance: 44.8 mL/min (by C-G formula based on SCr of 1.23 mg/dL). Liver Function Tests: No results for input(s): "AST", "ALT", "ALKPHOS", "BILITOT", "PROT", "ALBUMIN" in the last 168 hours. No results for input(s): "LIPASE", "AMYLASE" in the last 168 hours. No results for input(s): "AMMONIA" in the last 168 hours. Coagulation Profile: No results for input(s): "INR", "PROTIME" in the last 168 hours. Cardiac Enzymes: No results for input(s): "CKTOTAL", "CKMB", "CKMBINDEX", "TROPONINI" in the last 168 hours. BNP (last 3 results) No results for input(s): "PROBNP" in the last 8760 hours. HbA1C: No results for input(s): "HGBA1C" in the last 72 hours. CBG: No results for input(s): "GLUCAP" in the last 168 hours. Lipid Profile: No results for input(s): "CHOL", "HDL", "LDLCALC", "TRIG", "CHOLHDL", "LDLDIRECT" in the last 72 hours. Thyroid Function Tests: No results for input(s): "TSH", "T4TOTAL", "FREET4", "T3FREE", "THYROIDAB" in the last 72 hours. Anemia Panel: No results for input(s): "VITAMINB12", "FOLATE", "FERRITIN", "TIBC", "IRON", "RETICCTPCT" in the last 72 hours. Sepsis Labs: Recent Labs  Lab 07/04/23 2227 07/05/23 0401  PROCALCITON 0.31  --   LATICACIDVEN  --  1.4    Recent Results (from the past 240 hour(s))  Resp panel by RT-PCR (RSV, Flu A&B, Covid) Anterior Nasal Swab     Status: Abnormal   Collection Time: 07/05/23  4:01 AM   Specimen: Anterior Nasal Swab  Result Value Ref Range Status   SARS Coronavirus 2 by RT PCR POSITIVE (A) NEGATIVE Final    Comment: (NOTE) SARS-CoV-2 target nucleic acids are DETECTED.  The SARS-CoV-2 RNA is generally detectable in upper respiratory specimens during the acute phase of infection. Positive results are indicative of the presence  of the identified virus, but do not rule out bacterial infection or co-infection with other pathogens not detected by the test. Clinical correlation with patient history and other diagnostic information is necessary to determine patient infection status. The expected result is Negative.  Fact Sheet for Patients: BloggerCourse.com  Fact Sheet for Healthcare Providers: SeriousBroker.it  This test is not yet approved or cleared by the [place] FDA and  has been authorized for detection and/or diagnosis of SARS-CoV-2 by FDA under an Emergency Use Authorization (EUA).  This EUA will remain in effect (meaning this test can be used) for the duration of  the COVID-19 declaration under Section 564(b)(1) of the A ct, 21 U.S.C. section 360bbb-3(b)(1), unless the authorization is terminated or revoked sooner.     Influenza A by PCR NEGATIVE NEGATIVE Final   Influenza B by PCR NEGATIVE NEGATIVE Final    Comment: (NOTE) The Xpert Xpress SARS-CoV-2/FLU/RSV plus assay is intended as  an aid in the diagnosis of influenza from Nasopharyngeal swab specimens and should not be used as a sole basis for treatment. Nasal washings and aspirates are unacceptable for Xpert Xpress SARS-CoV-2/FLU/RSV testing.  Fact Sheet for Patients: BloggerCourse.com  Fact Sheet for Healthcare Providers: SeriousBroker.it  This test is not yet approved or cleared by the [place] FDA and has been authorized for detection and/or diagnosis of SARS-CoV-2 by FDA under an Emergency Use Authorization (EUA). This EUA will remain in effect (meaning this test can be used) for the duration of the COVID-19 declaration under Section 564(b)(1) of the Act, 21 U.S.C. section 360bbb-3(b)(1), unless the authorization is terminated or revoked.     Resp Syncytial Virus by PCR NEGATIVE NEGATIVE Final    Comment: (NOTE) Fact Sheet  for Patients: BloggerCourse.com  Fact Sheet for Healthcare Providers: SeriousBroker.it  This test is not yet approved or cleared by the [place] FDA and has been authorized for detection and/or diagnosis of SARS-CoV-2 by FDA under an Emergency Use Authorization (EUA). This EUA will remain in effect (meaning this test can be used) for the duration of the COVID-19 declaration under Section 564(b)(1) of the Act, 21 U.S.C. section 360bbb-3(b)(1), unless the authorization is terminated or revoked.  Performed at [facility], [address] [place], [place] [address]   Blood Culture (routine x 2)     Status: None (Preliminary result)   Collection Time: 07/05/23  4:01 AM   Specimen: BLOOD  Result Value Ref Range Status   Specimen Description BLOOD RIGHT ANTECUBITAL  Final   Special Requests   Final    BOTTLES DRAWN AEROBIC AND ANAEROBIC Blood Culture adequate volume   Culture   Final    NO GROWTH 2 DAYS Performed at [facility] [address], [place], [place] [address]    Report Status PENDING  Incomplete  Blood Culture (routine x 2)     Status: None (Preliminary result)   Collection Time: 07/05/23  4:01 AM   Specimen: BLOOD  Result Value Ref Range Status   Specimen Description BLOOD BLOOD RIGHT FOREARM  Final   Special Requests   Final    BOTTLES DRAWN AEROBIC AND ANAEROBIC Blood Culture adequate volume   Culture   Final    NO GROWTH 2 DAYS Performed at [facility], [address], [place], [place] [address]    Report Status PENDING  Incomplete         Radiology Studies: DG Chest 1 View  Result Date: 07/06/2023 CLINICAL DATA:  [id number].  Pneumonia follow-up. EXAM: CHEST  1 VIEW COMPARISON:  PA and lateral 07/04/2023. FINDINGS: 6:47 a.m. Cardiomegaly. Mild increased central vascular prominence without overt edema. There is increased patchy hazy airspace disease left mid to lower lung  field, probably in the lower lobe, and scattered opacities newly noted in the right lower lung field most likely new areas of pneumonia. There is no substantial pleural effusion. The remaining lungs are clear. Stable mediastinum with mild aortic tortuosity and atherosclerosis. No new osseous abnormality. IMPRESSION: 1. Increased patchy hazy airspace disease left mid to lower lung field, probably in the lower lobe, and scattered opacities newly noted in the right lower lung field most likely new areas of pneumonia. 2. Cardiomegaly with mild increased central vascular prominence without overt edema. Electronically Signed   By: Almira Bar M.D.   On: 07/06/2023 07:02        Scheduled Meds:  apixaban  5 mg Oral BID   azelastine  1 spray Each Nare BID   cholecalciferol  1,000 Units Oral Daily   fluticasone  2 spray Each Nare Daily   guaiFENesin  1,200 mg Oral BID   ipratropium-albuterol  3 mL Nebulization Q6H   ketoconazole   Topical Once per day on Monday Thursday   latanoprost  1 drop Both Eyes QHS   levETIRAcetam  1,000 mg Oral Daily   loratadine  10 mg Oral Daily   metoprolol succinate  25 mg Oral Daily   pantoprazole  40 mg Oral Daily   polyethylene glycol  17 g Oral Daily   psyllium  1 packet Oral Daily   rosuvastatin  40 mg Oral Daily   Continuous Infusions:  azithromycin 500 mg (07/07/23 1234)   cefTRIAXone (ROCEPHIN)  IV 1 g (07/07/23 1143)     LOS: 2 days       Charise Killian, MD [facility] Pager [phone]  If 7PM-7AM, please contact night-coverage [email or web address] 07/07/2023, 12:38 PM

## 2023-07-07 NOTE — TOC CM/SW Note (Signed)
Patient is not able to walk the distance required to go the bathroom, or he/she is unable to safely negotiate stairs required to access the bathroom.  A 3in1 BSC will alleviate this problem  

## 2023-07-07 NOTE — TOC Progression Note (Signed)
Transition of Care [facility]) - Progression Note    Patient Details  Name: Edwin [name] MRN: [id number] Date of Birth: Jun 22, 1941  Transition of [facility]) CM/SW Contact  Allena Katz, LCSW Phone Number: 07/07/2023, 12:09 PM  Clinical Narrative:   CSW spoke with patients wife who reports she would like to get [facility] services for the patient. She reports they have never used HH. CSW educated her on how this works and offered choice. She reports no preference. Wife would also like A BSC and a RW. TOC to order. Pt is currently active with dr scott for primary care.          Expected Discharge Plan and Services                                               Social Determinants of Health (SDOH) Interventions SDOH Screenings   Food Insecurity: No Food Insecurity (07/06/2023)  Housing: Low Risk  (07/06/2023)  Transportation Needs: No Transportation Needs (07/06/2023)  Utilities: Not At Risk (07/06/2023)  Alcohol Screen: Low Risk  (03/04/2023)  Depression (PHQ2-9): Low Risk  (03/04/2023)  Financial Resource Strain: Low Risk  (03/04/2023)  Physical Activity: Sufficiently Active (03/04/2023)  Social Connections: Moderately Integrated (03/04/2023)  Stress: No Stress Concern Present (03/04/2023)  Tobacco Use: Low Risk  (07/06/2023)    Readmission Risk Interventions     No data to display

## 2023-07-08 DIAGNOSIS — J189 Pneumonia, unspecified organism: Secondary | ICD-10-CM | POA: Diagnosis not present

## 2023-07-08 LAB — BASIC METABOLIC PANEL
Anion gap: 11 (ref 5–15)
BUN: 24 mg/dL — ABNORMAL HIGH (ref 8–23)
CO2: 22 mmol/L (ref 22–32)
Calcium: 8.4 mg/dL — ABNORMAL LOW (ref 8.9–10.3)
Chloride: 99 mmol/L (ref 98–111)
Creatinine, Ser: 1.13 mg/dL (ref 0.61–1.24)
GFR, Estimated: >60 mL/min (ref >60)
Glucose, Bld: 110 mg/dL — ABNORMAL HIGH (ref 70–99)
Potassium: 4.1 mmol/L (ref 3.5–5.1)
Sodium: 132 mmol/L — ABNORMAL LOW (ref 135–145)

## 2023-07-08 LAB — CBC
HCT: 36.9 % — ABNORMAL LOW (ref 39.0–52.0)
Hemoglobin: 12.8 g/dL — ABNORMAL LOW (ref 13.0–17.0)
MCH: 29 pg (ref 26.0–34.0)
MCHC: 34.7 g/dL (ref 30.0–36.0)
MCV: 83.7 fL (ref 80.0–100.0)
Platelets: 268 10*3/uL (ref 150–400)
RBC: 4.41 MIL/uL (ref 4.22–5.81)
RDW: 13.5 % (ref 11.5–15.5)
WBC: 11.9 10*3/uL — ABNORMAL HIGH (ref 4.0–10.5)
nRBC: 0 % (ref 0.0–0.2)

## 2023-07-08 LAB — LEGIONELLA PNEUMOPHILA SEROGP 1 UR AG: L. pneumophila Serogp 1 Ur Ag: NEGATIVE

## 2023-07-08 LAB — BRAIN NATRIURETIC PEPTIDE: B Natriuretic Peptide: 338.4 pg/mL — ABNORMAL HIGH (ref 0.0–100.0)

## 2023-07-08 LAB — MYCOPLASMA PNEUMONIAE ANTIBODY, IGM: Mycoplasma pneumo IgM: <770 U/mL (ref 0–769)

## 2023-07-08 MED ORDER — GUAIFENESIN ER 600 MG PO TB12
600.0000 mg | ORAL_TABLET | Freq: Two times a day (BID) | ORAL | Status: DC
  Administered 2023-07-08 – 2023-07-11 (×6): 600 mg via ORAL
  Filled 2023-07-08 (×6): qty 1

## 2023-07-08 MED ORDER — SODIUM CHLORIDE 1 G PO TABS
1.0000 g | ORAL_TABLET | Freq: Two times a day (BID) | ORAL | Status: DC
  Administered 2023-07-09 – 2023-07-11 (×4): 1 g via ORAL
  Filled 2023-07-08 (×4): qty 1

## 2023-07-08 MED ORDER — SODIUM CHLORIDE 1 G PO TABS
1.0000 g | ORAL_TABLET | Freq: Three times a day (TID) | ORAL | Status: AC
  Administered 2023-07-08 – 2023-07-09 (×2): 1 g via ORAL
  Filled 2023-07-08 (×2): qty 1

## 2023-07-08 NOTE — Care Management Important Message (Signed)
Important Message  Patient Details  Name: Edwin [name] MRN: [id number] Date of Birth: 1940/11/10   Important Message Given:  Yes - Medicare IM  Patient is in an isolation room so I called his room [phone]) to review the Important Message from Medicare with him and his wife said she understood these rights. I offered to bring a copy and meet her in the hallway if she needed it. I wished him well and thanked her for her time.   Olegario Messier A Kaelene Elliston 07/08/2023, 11:09 AM

## 2023-07-08 NOTE — TOC Progression Note (Signed)
Transition of Care [facility]) - Progression Note    Patient Details  Name: Edwin [name] MRN: [id number] Date of Birth: 1941/04/18  Transition of Care [facility]) CM/SW Contact  Allena Katz, LCSW Phone Number: 07/08/2023, 11:01 AM  Clinical Narrative:  RW and BSC ordered to be delivered to patients room through Jon at [employer]. Adelina Mings with [facility] accepted referral for [facility] PT/OT.          Expected Discharge Plan and Services                                               Social Determinants of Health (SDOH) Interventions SDOH Screenings   Food Insecurity: No Food Insecurity (07/06/2023)  Housing: Low Risk  (07/06/2023)  Transportation Needs: No Transportation Needs (07/06/2023)  Utilities: Not At Risk (07/06/2023)  Alcohol Screen: Low Risk  (03/04/2023)  Depression (PHQ2-9): Low Risk  (03/04/2023)  Financial Resource Strain: Low Risk  (03/04/2023)  Physical Activity: Sufficiently Active (03/04/2023)  Social Connections: Moderately Integrated (03/04/2023)  Stress: No Stress Concern Present (03/04/2023)  Tobacco Use: Low Risk  (07/06/2023)    Readmission Risk Interventions     No data to display

## 2023-07-08 NOTE — Progress Notes (Signed)
Occupational Therapy Treatment Patient Details Name: Edwin [name] MRN: [id number] DOB: [birth year] Today's Date: 07/08/2023   History of present illness Edwin [name] is a 82 y.o. male with medical history significant of PAF on Eliquis, CAD stenting, seizure disorder, HTN, HLD, recent COVID+, presented with worsening of cough and fever. Chest x-ray showed left sided lower lobe pneumonia.   OT comments  Edwin [name] was seen for OT treatment on this date. Upon arrival to room pt reclined in bed, agreeable to tx. Pt requires MIN A standing urinal use, assist for clothing mgmt. CGA + RW for functional mobility ~320 ft, poor insight into oxygen needs. At rest SpO2 91% on 2L [place], desat 87% with mobility, resolved with prolonged rest break. Educated on IS frequency/use and importance of OOB to chair for meals/ambulating with nursing for improved activity tolerance. Pt making good progress toward goals, will continue to follow POC. Discharge recommendation updated to reflect progress.       If plan is discharge home, recommend the following:  A little help with walking and/or transfers;A little help with bathing/dressing/bathroom;Assistance with cooking/housework;Assist for transportation   Equipment Recommendations  BSC/3in1;Other (comment) (RW)    Recommendations for Other Services      Precautions / Restrictions Precautions Precautions: Fall Restrictions Weight Bearing Restrictions: No       Mobility Bed Mobility Overal bed mobility: Needs Assistance Bed Mobility: Supine to Sit, Sit to Supine     Supine to sit: Contact guard Sit to supine: Contact guard assist        Transfers Overall transfer level: Needs assistance Equipment used: 1 person hand held assist Transfers: Sit to/from Stand Sit to Stand: Contact guard assist                 Balance Overall balance assessment: Needs assistance Sitting-balance support: Feet unsupported, Single extremity  supported Sitting balance-Leahy Scale: Good     Standing balance support: No upper extremity supported, During functional activity Standing balance-Leahy Scale: Fair Standing balance comment: intermittent LOBs, MIN A to correct, improved with RW                           ADL either performed or assessed with clinical judgement   ADL Overall ADL's : Needs assistance/impaired                                       General ADL Comments: MIN A standing urinal use, assist for clothing mgmt. CGA + RW for functional mobility ~320 ft.       Cognition Arousal: Alert Behavior During Therapy: WFL for tasks assessed/performed, Impulsive Overall Cognitive Status: Within Functional Limits for tasks assessed                                 General Comments: HoH, impulsive        Exercises Exercises: Other exercises Other Exercises Other Exercises: educated on IS and increasing mobility    Shoulder Instructions       General Comments At rest SpO2 91% on 2L [place], desat 87% with mobility, resolved with prolonged rest break    Pertinent Vitals/ Pain       Pain Assessment Pain Assessment: No/denies pain   Frequency  Min 1X/week        Progress Toward Goals  OT Goals(current goals can now be found in the care plan section)  Progress towards OT goals: Progressing toward goals  Acute Rehab OT Goals Patient Stated Goal: to go home OT Goal Formulation: With patient/family Time For Goal Achievement: 07/19/23 Potential to Achieve Goals: Good ADL Goals Pt Will Perform Grooming: with supervision;standing Pt Will Transfer to Toilet: with supervision;ambulating;grab bars Pt Will Perform Toileting - Clothing Manipulation and hygiene: with supervision;sitting/lateral leans;sit to/from stand  Plan      Co-evaluation                 AM-PAC OT "6 Clicks" Daily Activity     Outcome Measure   Help from another person eating meals?:  None Help from another person taking care of personal grooming?: None Help from another person toileting, which includes using toliet, bedpan, or urinal?: A Little Help from another person bathing (including washing, rinsing, drying)?: A Little Help from another person to put on and taking off regular upper body clothing?: A Little Help from another person to put on and taking off regular lower body clothing?: A Little 6 Click Score: 20    End of Session Equipment Utilized During Treatment: Gait belt;Rolling walker (2 wheels)  OT Visit Diagnosis: Unsteadiness on feet (R26.81);Other abnormalities of gait and mobility (R26.89)   Activity Tolerance Patient tolerated treatment well   Patient Left in chair;with call bell/phone within reach;with family/visitor present   Nurse Communication Mobility status        Time: 1121-1200 OT Time Calculation (min): 39 min  Charges: OT General Charges $OT Visit: 1 Visit OT Treatments $Self Care/Home Management : 8-22 mins $Therapeutic Activity: 8-22 mins $Therapeutic Exercise: 8-22 mins  Kathie Dike, M.S. OTR/L  07/08/23, 12:51 PM  ascom [phone]

## 2023-07-08 NOTE — Progress Notes (Signed)
[facility] Progress Note  Patient: Edwin [name]    RJJ:[id number]  DOA: 07/05/2023     Date of Service: the patient was seen and examined on 07/08/2023  Chief Complaint  Patient presents with   COVID +   Brief hospital course: Edwin [name] is a 82 y.o. male with medical history significant of PAF on Eliquis, CAD stenting, seizure disorder, HTN, HLD, presented with worsening of cough and fever.   Symptoms started 7 to 8 days ago, patient started to develop a cough nonproductive dry cough, and went to see his doctor 6 days ago, was diagnosed with COVID.  However was told that out of window for Paxlovid and sent home with short course of doxycycline for 7 days, cough medications.  Initially the cough was severe on Monday and Tuesday then became better on Thursday, remained to be a dry cough however Friday patient started to have a fever of 104 and was given Tylenol.  Yesterday fever was 100.4.  Overnight patient developed shortness of breath and family decided to bring him to the hospital.  Denies any chest pains, no abdominal pain no diarrhea no urinary symptoms no rash.  Denied any cough or choking after eating or drinking, no history of aspiration pneumonia   ED Course:  Fever 102.2, tachycardia, tachypneic not hypoxic.  Chest x-ray showed left sided lower lobe pneumonia.  Blood work showed WBC 10.7, hemoglobin 13.9, creatinine 1.0, K4.2.  UA showed WBC 6-10.  COVID negative.  Lactic acid 1.2   He was started on ceftriaxone and azithromycin in the ED.  Assessment and Plan:  # CAP: bacterial, post-viral pneumonia. Failed outpatient management. Continue on IV rocephin, azithromycin, bronchodilators & encourage incentive spirometry. Legionella, mycoplasma are negative  # Hypoxic respiratory failure due to pneumonia.  Continue supplemental O2 inhalation and gradually wean off BNP 338, slightly elevated.  LVEF 60 to 65%  # COVID-19 infection: out of window for antiviral  treatment. Continue w/ supportive care. Continue on airborne & contact precautions    # Sepsis due to pneumonia, vital signs stable.  Continue antibiotics   # PAF: continue on metoprolol, eliquis    # Seizure disorder: continue on home dose of keppra    # Hx of CAD: no CP. Continue on home dose of metoprolol    Deconditioning: PT recs HH. OT recs SNF    Vitamin D deficiency: continue on vit D supplement  Isotonic hyponatremia, serum osmolarity 287 within normal range Monitor sodium level daily Started salt tablet 1 g p.o. twice daily   Body mass index is 25.85 kg/m.  Interventions:  Diet: Heart healthy diet DVT Prophylaxis: Therapeutic Anticoagulation with Eliquis    Advance goals of care discussion: Full code  Family Communication: family was present at bedside, at the time of interview.  The pt provided permission to discuss medical plan with the family. Opportunity was given to ask question and all questions were answered satisfactorily.   Disposition:  Pt is from Home, admitted with pneumonia, COVID viral infection, still has respiratory failure on supplemental O2 admission, which precludes a safe discharge. Discharge to SNF, when when stable, most likely in 1 to 2 days.  Subjective: No significant events overnight, patient is still having some shortness of breath and mild cough and congestion, still using supplemental O2 relation, feels generalized weakness, no any specific complaints, no chest pain or palpitations.  Physical Exam: General: NAD, lying comfortably Appear in no distress, affect appropriate Eyes: PERRLA ENT: Oral Mucosa Clear, moist  Neck: no JVD,  Cardiovascular: S1 and S2 Present, no Murmur,  Respiratory: Equal air entry bilaterally, mild bilateral crackles, no significant wheezing appreciated. Abdomen: Bowel Sound present, Soft and no tenderness,  Skin: no rashes Extremities: no Pedal edema, no calf tenderness Neurologic: without any new focal  findings Gait not checked due to patient safety concerns  Vitals:   07/07/23 2055 07/08/23 0449 07/08/23 0849 07/08/23 0900  BP: 112/87 120/83 131/82   Pulse: 98     Resp:   16   Temp: (!) 100.6 F (38.1 C) 99.5 F (37.5 C) 99.1 F (37.3 C)   TempSrc:      SpO2: 91% 92% (!) 89% 92%  Weight:      Height:        Intake/Output Summary (Last 24 hours) at 07/08/2023 1541 Last data filed at 07/08/2023 0449 Gross per 24 hour  Intake --  Output 376 ml  Net -376 ml   Filed Weights   07/04/23 2108  Weight: 77.1 kg    Data Reviewed: I have personally reviewed and interpreted daily labs, tele strips, imagings as discussed above. I reviewed all nursing notes, pharmacy notes, vitals, pertinent old records I have discussed plan of care as described above with RN and patient/family.  CBC: Recent Labs  Lab 07/04/23 2229 07/06/23 0528 07/07/23 0415 07/08/23 0434  WBC 10.7* 11.3* 12.9* 11.9*  NEUTROABS 8.8*  --   --   --   HGB 13.9 12.3* 13.9 12.8*  HCT 40.5 35.8* 41.8 36.9*  MCV 84.9 83.4 87.3 83.7  PLT 196 203 251 268   Basic Metabolic Panel: Recent Labs  Lab 07/04/23 2229 07/06/23 0528 07/07/23 0415 07/08/23 0434  NA 133* 132* 131* 132*  K 4.2 4.1 4.0 4.1  CL 102 99 97* 99  CO2 21* 24 22 22   GLUCOSE 146* 118* 105* 110*  BUN 22 24* 23 24*  CREATININE 1.02 1.23 1.23 1.13  CALCIUM 9.1 8.6* 8.8* 8.4*    Studies: No results found.  Scheduled Meds:  apixaban  5 mg Oral BID   azelastine  1 spray Each Nare BID   cholecalciferol  1,000 Units Oral Daily   fluticasone  2 spray Each Nare Daily   guaiFENesin  1,200 mg Oral BID   Ipratropium-Albuterol  1 puff Inhalation Q6H   ketoconazole   Topical Once per day on Monday Thursday   latanoprost  1 drop Both Eyes QHS   levETIRAcetam  1,000 mg Oral Daily   loratadine  10 mg Oral Daily   metoprolol succinate  25 mg Oral Daily   pantoprazole  40 mg Oral Daily   polyethylene glycol  17 g Oral Daily   psyllium  1 packet  Oral Daily   rosuvastatin  40 mg Oral Daily   Continuous Infusions:  azithromycin 500 mg (07/08/23 1243)   cefTRIAXone (ROCEPHIN)  IV 1 g (07/08/23 1209)   PRN Meds: acetaminophen **OR** acetaminophen, chlorpheniramine-HYDROcodone, ondansetron **OR** ondansetron (ZOFRAN) IV  Time spent: 35 minutes  Author: Gillis Santa. MD [facility] 07/08/2023 3:41 PM  To reach On-call, see care teams to locate the attending and reach out to them via [email or web address]. If 7PM-7AM, please contact night-coverage If you still have difficulty reaching the attending provider, please page the [facility] (Director on Call) for [facility] on amion for assistance.

## 2023-07-08 NOTE — Progress Notes (Signed)
PT Cancellation Note  Patient Details Name: JEZIAH KRETSCHMER MRN: [id number] DOB: 1940/12/09   Cancelled Treatment:    Reason Eval/Treat Not Completed: Fatigue/lethargy limiting ability to participate Patient appearing sleepy in bed, wife at bedside. She reports he got back into bed due to fatigue and has been confused this pm. RN aware per wife. O2 sats were checked and in mid 90%s. Patient asks to wait until later. Will re-attempt at later time/date.  Gordan Grell 07/08/2023, 3:11 PM

## 2023-07-08 NOTE — Plan of Care (Addendum)
Patient is alert and oriented X 4. he still have a non productive cough and diminished lung sounds. He has been getting I/v abx. Denies any pain. Problem: Education: Goal: Knowledge of risk factors and measures for prevention of condition will improve 07/08/2023 1743 by Leandro Reasoner, RN Outcome: Progressing 07/08/2023 1743 by Leandro Reasoner, RN Outcome: Progressing   Problem: Coping: Goal: Psychosocial and spiritual needs will be supported 07/08/2023 1743 by Leandro Reasoner, RN Outcome: Progressing 07/08/2023 1743 by Leandro Reasoner, RN Outcome: Progressing   Problem: Respiratory: Goal: Will maintain a patent airway 07/08/2023 1743 by Leandro Reasoner, RN Outcome: Progressing 07/08/2023 1743 by Leandro Reasoner, RN Outcome: Progressing Goal: Complications related to the disease process, condition or treatment will be avoided or minimized 07/08/2023 1743 by Leandro Reasoner, RN Outcome: Progressing 07/08/2023 1743 by Leandro Reasoner, RN Outcome: Progressing   Problem: Education: Goal: Knowledge of General Education information will improve Description: Including pain rating scale, medication(s)/side effects and non-pharmacologic comfort measures 07/08/2023 1743 by Leandro Reasoner, RN Outcome: Progressing 07/08/2023 1743 by Leandro Reasoner, RN Outcome: Progressing   Problem: Health Behavior/Discharge Planning: Goal: Ability to manage health-related needs will improve Outcome: Progressing   Problem: Clinical Measurements: Goal: Ability to maintain clinical measurements within normal limits will improve Outcome: Progressing Goal: Will remain free from infection Outcome: Progressing Goal: Diagnostic test results will improve Outcome: Progressing Goal: Respiratory complications will improve Outcome: Progressing Goal: Cardiovascular complication will be avoided Outcome: Progressing   Problem: Activity: Goal: Risk for activity intolerance will decrease Outcome: Progressing    Problem: Nutrition: Goal: Adequate nutrition will be maintained Outcome: Progressing   Problem: Coping: Goal: Level of anxiety will decrease Outcome: Progressing   Problem: Elimination: Goal: Will not experience complications related to bowel motility Outcome: Progressing Goal: Will not experience complications related to urinary retention Outcome: Progressing   Problem: Pain Management: Goal: General experience of comfort will improve Outcome: Progressing   Problem: Safety: Goal: Ability to remain free from injury will improve Outcome: Progressing   Problem: Skin Integrity: Goal: Risk for impaired skin integrity will decrease Outcome: Progressing

## 2023-07-09 DIAGNOSIS — J189 Pneumonia, unspecified organism: Secondary | ICD-10-CM | POA: Diagnosis not present

## 2023-07-09 LAB — BASIC METABOLIC PANEL
Anion gap: 9 (ref 5–15)
BUN: 24 mg/dL — ABNORMAL HIGH (ref 8–23)
CO2: 22 mmol/L (ref 22–32)
Calcium: 8.5 mg/dL — ABNORMAL LOW (ref 8.9–10.3)
Chloride: 102 mmol/L (ref 98–111)
Creatinine, Ser: 1.08 mg/dL (ref 0.61–1.24)
GFR, Estimated: >60 mL/min (ref >60)
Glucose, Bld: 97 mg/dL (ref 70–99)
Potassium: 3.7 mmol/L (ref 3.5–5.1)
Sodium: 133 mmol/L — ABNORMAL LOW (ref 135–145)

## 2023-07-09 LAB — MAGNESIUM: Magnesium: 2 mg/dL (ref 1.7–2.4)

## 2023-07-09 LAB — CBC
HCT: 36.7 % — ABNORMAL LOW (ref 39.0–52.0)
Hemoglobin: 12.6 g/dL — ABNORMAL LOW (ref 13.0–17.0)
MCH: 28.5 pg (ref 26.0–34.0)
MCHC: 34.3 g/dL (ref 30.0–36.0)
MCV: 83 fL (ref 80.0–100.0)
Platelets: 299 10*3/uL (ref 150–400)
RBC: 4.42 MIL/uL (ref 4.22–5.81)
RDW: 13.5 % (ref 11.5–15.5)
WBC: 10 10*3/uL (ref 4.0–10.5)
nRBC: 0 % (ref 0.0–0.2)

## 2023-07-09 LAB — PHOSPHORUS: Phosphorus: 2.7 mg/dL (ref 2.5–4.6)

## 2023-07-09 LAB — OSMOLALITY: Osmolality: 287 mosm/kg (ref 275–295)

## 2023-07-09 MED ORDER — ALBUTEROL SULFATE HFA 108 (90 BASE) MCG/ACT IN AERS: 2.0000 | INHALATION_SPRAY | Freq: Four times a day (QID) | RESPIRATORY_TRACT | Status: DC

## 2023-07-09 NOTE — Progress Notes (Signed)
Physical Therapy Treatment Patient Details Name: Edwin [name] MRN: [id number] DOB: 1940/12/07 Today's Date: 07/09/2023   History of Present Illness Edwin [name] is a 82 y.o. male with medical history significant of PAF on Eliquis, CAD stenting, seizure disorder, HTN, HLD, recent COVID+, presented with worsening of cough and fever. Chest x-ray showed left sided lower lobe pneumonia.    PT Comments  Pt received up in chair, wife at bedside. 88% on RA at rest, pt placed back on 2L with SpO2 increase to 91-94%. Gait training with RW while on 2L ~90 ft with CGA and repeated cues to stay inside RW and proper hand placement. Pt dropped to 88-89% while ambulating, therefore distance was limited. Pt also noted to have a slight wheeze with increased fatigue. Nursing notified.   If plan is discharge home, recommend the following: A little help with walking and/or transfers;Assist for transportation;Help with stairs or ramp for entrance   Can travel by private vehicle        Equipment Recommendations  Rolling walker (2 wheels)    Recommendations for Other Services       Precautions / Restrictions Precautions Precautions: Fall Restrictions Weight Bearing Restrictions: No     Mobility  Bed Mobility Overal bed mobility: Needs Assistance             General bed mobility comments: Pt in chair pre/post session    Transfers Overall transfer level: Needs assistance Equipment used: Rolling walker (2 wheels) Transfers: Sit to/from Stand Sit to Stand: Contact guard assist           General transfer comment: Pt able to stand without AD initially, however noted to have some unsteadiness and was given RW.  Pt much more stable with AD.    Ambulation/Gait Ambulation/Gait assistance: Contact guard assist Gait Distance (Feet):  (90) Assistive device: Rolling walker (2 wheels) Gait Pattern/deviations: Step-through pattern, Drifts right/left Gait velocity: decreased      General Gait Details: Pt unsteady at times, repeated cues to stay inside RW with proper hand placement   Stairs             Wheelchair Mobility     Tilt Bed    Modified Rankin (Stroke Patients Only)       Balance Overall balance assessment: Needs assistance Sitting-balance support: Feet unsupported, Single extremity supported Sitting balance-Leahy Scale: Good     Standing balance support: During functional activity, Bilateral upper extremity supported, Reliant on assistive device for balance Standing balance-Leahy Scale: Fair Standing balance comment: intermittent LOBs, MIN A to correct, improved with RW                            Cognition Arousal: Alert Behavior During Therapy: WFL for tasks assessed/performed, Impulsive Overall Cognitive Status: Impaired/Different from baseline Area of Impairment: Orientation, Attention, Memory, Following commands, Safety/judgement, Awareness, Problem solving                 Orientation Level: Disoriented to, Time, Situation Current Attention Level: Alternating Memory: Decreased short-term memory Following Commands: Follows one step commands inconsistently Safety/Judgement: Decreased awareness of safety, Decreased awareness of deficits Awareness: Intellectual Problem Solving: Slow processing, Decreased initiation, Requires verbal cues General Comments: HoH, impulsive        Exercises Other Exercises Other Exercises: Pt and wife educated on role of PT and benefits of mobility. Pt cued on PLB to increase SpO2 level. Discussed d/c plans and recs for HHPT with pt  and wife    General Comments General comments (skin integrity, edema, etc.): Pt 88% at rest on RA, placed on 2L O2 sats increased to 91-94%. during gait training on 2L O2 pt began to desat to upper 80's with increased SOB and slight wheezing      Pertinent Vitals/Pain Pain Assessment Pain Assessment: No/denies pain    Home Living                           Prior Function            PT Goals (current goals can now be found in the care plan section) Acute Rehab PT Goals Patient Stated Goal: to go back home. Progress towards PT goals: Progressing toward goals    Frequency    Min 1X/week      PT Plan      Co-evaluation              AM-PAC PT "6 Clicks" Mobility   Outcome Measure  Help needed turning from your back to your side while in a flat bed without using bedrails?: None Help needed moving from lying on your back to sitting on the side of a flat bed without using bedrails?: None Help needed moving to and from a bed to a chair (including a wheelchair)?: None Help needed standing up from a chair using your arms (e.g., wheelchair or bedside chair)?: A Little Help needed to walk in hospital room?: A Little Help needed climbing 3-5 steps with a railing? : A Little 6 Click Score: 21    End of Session Equipment Utilized During Treatment: Gait belt Activity Tolerance: Patient tolerated treatment well Patient left: in chair;with call bell/phone within reach;with chair alarm set;with family/visitor present Nurse Communication: Mobility status;Other (comment) (O2 levels during activity) PT Visit Diagnosis: Unsteadiness on feet (R26.81);Muscle weakness (generalized) (M62.81);Difficulty in walking, not elsewhere classified (R26.2)     Time: 7829-5621 PT Time Calculation (min) (ACUTE ONLY): 24 min  Charges:    $Gait Training: 8-22 mins $Therapeutic Activity: 8-22 mins PT General Charges $$ ACUTE PT VISIT: 1 Visit                    Zadie Cleverly, PTA  Edwin [name] 07/09/2023, 3:42 PM

## 2023-07-09 NOTE — Progress Notes (Signed)
Occupational Therapy Treatment Patient Details Name: Edwin [name] MRN: [id number] DOB: June 07, 1941 Today's Date: 07/09/2023   History of present illness Edwin [name] is a 82 y.o. male with medical history significant of PAF on Eliquis, CAD stenting, seizure disorder, HTN, HLD, recent COVID+, presented with worsening of cough and fever. Chest x-ray showed left sided lower lobe pneumonia.   OT comments  Mr Edwin [name] was seen for OT treatment on this date. Upon arrival to room pt reclined in bed, agreeable to tx. Pt requires MIN A exit bed, increased assist as pt reports mild confusion when waking up from nap. SETUP seated electric razor use, assist from spouse. SpO2 93% on 2L [place] at rest, decrease to 89% on RA in sitting, 94% with activity on 2L [place]. Completed x10 sit<>stands with RW no physical assist, cues for hand placement. Pt making good progress toward goals, will continue to follow POC. Discharge recommendation remains appropriate.       If plan is discharge home, recommend the following:  A little help with walking and/or transfers;A little help with bathing/dressing/bathroom;Assistance with cooking/housework;Assist for transportation   Equipment Recommendations  BSC/3in1;Other (comment) (RW)    Recommendations for Other Services      Precautions / Restrictions Precautions Precautions: Fall Restrictions Weight Bearing Restrictions: No       Mobility Bed Mobility Overal bed mobility: Needs Assistance Bed Mobility: Supine to Sit, Sit to Supine     Supine to sit: Min assist Sit to supine: Supervision        Transfers Overall transfer level: Needs assistance Equipment used: Rolling walker (2 wheels) Transfers: Sit to/from Stand Sit to Stand: Contact guard assist           General transfer comment: x10 stands     Balance Overall balance assessment: Needs assistance Sitting-balance support: Feet unsupported, Single extremity supported Sitting balance-Leahy  Scale: Good     Standing balance support: Single extremity supported, During functional activity Standing balance-Leahy Scale: Fair                             ADL either performed or assessed with clinical judgement   ADL Overall ADL's : Needs assistance/impaired                                       General ADL Comments: SETUP seated electric razor use, assist from spouse.       Cognition Arousal: Alert Behavior During Therapy: WFL for tasks assessed/performed, Impulsive Overall Cognitive Status: Impaired/Different from baseline Area of Impairment: Following commands                       Following Commands: Follows one step commands with increased time       General Comments: ? hard of hearing and mild hospital delirum              General Comments 93% on 2L [place] at rest, decrease to 89% on RA in sitting, 94% with activity on 2L     Pertinent Vitals/ Pain       Pain Assessment Pain Assessment: No/denies pain   Frequency  Min 1X/week        Progress Toward Goals  OT Goals(current goals can now be found in the care plan section)  Progress towards OT goals: Progressing toward goals  Acute Rehab OT  Goals Patient Stated Goal: get stronger OT Goal Formulation: With patient/family Time For Goal Achievement: 07/19/23 Potential to Achieve Goals: Good ADL Goals Pt Will Perform Grooming: with supervision;standing Pt Will Transfer to Toilet: with supervision;ambulating;grab bars Pt Will Perform Toileting - Clothing Manipulation and hygiene: with supervision;sitting/lateral leans;sit to/from stand  Plan      Co-evaluation                 AM-PAC OT "6 Clicks" Daily Activity     Outcome Measure   Help from another person eating meals?: None Help from another person taking care of personal grooming?: None Help from another person toileting, which includes using toliet, bedpan, or urinal?: A Little Help from  another person bathing (including washing, rinsing, drying)?: A Little Help from another person to put on and taking off regular upper body clothing?: A Little Help from another person to put on and taking off regular lower body clothing?: A Little 6 Click Score: 20    End of Session    OT Visit Diagnosis: Unsteadiness on feet (R26.81);Other abnormalities of gait and mobility (R26.89)   Activity Tolerance Patient tolerated treatment well   Patient Left in bed;with call bell/phone within reach;with family/visitor present   Nurse Communication          Time: 4742-5956 OT Time Calculation (min): 28 min  Charges: OT General Charges $OT Visit: 1 Visit OT Treatments $Self Care/Home Management : 8-22 mins $Therapeutic Exercise: 8-22 mins  Kathie Dike, M.S. OTR/L  07/09/23, 4:29 PM  ascom [phone]

## 2023-07-09 NOTE — Progress Notes (Addendum)
[facility] Progress Note  Patient: Edwin [name]    YTK:[id number]  DOA: 07/05/2023     Date of Service: the patient was seen and examined on 07/09/2023  Chief Complaint  Patient presents with   COVID +   Brief hospital course: Edwin [name] is a 82 y.o. male with medical history significant of PAF on Eliquis, CAD stenting, seizure disorder, HTN, HLD, presented with worsening of cough and fever.   Symptoms started 7 to 8 days ago, patient started to develop a cough nonproductive dry cough, and went to see his doctor 6 days ago, was diagnosed with COVID.  However was told that out of window for Paxlovid and sent home with short course of doxycycline for 7 days, cough medications.  Initially the cough was severe on Monday and Tuesday then became better on Thursday, remained to be a dry cough however Friday patient started to have a fever of 104 and was given Tylenol.  Yesterday fever was 100.4.  Overnight patient developed shortness of breath and family decided to bring him to the hospital.  Denies any chest pains, no abdominal pain no diarrhea no urinary symptoms no rash.  Denied any cough or choking after eating or drinking, no history of aspiration pneumonia   ED Course:  Fever 102.2, tachycardia, tachypneic not hypoxic.  Chest x-ray showed left sided lower lobe pneumonia.  Blood work showed WBC 10.7, hemoglobin 13.9, creatinine 1.0, K4.2.  UA showed WBC 6-10.  COVID negative.  Lactic acid 1.2   He was started on ceftriaxone and azithromycin in the ED.  Assessment and Plan:  # CAP: bacterial, post-viral pneumonia. Failed outpatient management. Continue on IV rocephin, azithromycin, bronchodilators & encourage incentive spirometry. Legionella, mycoplasma are negative  # Hypoxic respiratory failure due to pneumonia.  Continue supplemental O2 inhalation and gradually wean off BNP 338, slightly elevated.  LVEF 60 to 65% Continue Combivent inhaler every 6 hourly  # COVID-19  infection: out of window for antiviral treatment. Continue w/ supportive care. Continue on airborne & contact precautions    # Sepsis due to pneumonia, vital signs stable.  Continue antibiotics   # PAF: continue on metoprolol, eliquis    # Seizure disorder: continue on home dose of keppra    # Hx of CAD: no CP. Continue on home dose of metoprolol    Deconditioning: PT recs HH. OT recs SNF    Vitamin D deficiency: continue on vit D supplement  Isotonic hyponatremia, serum osmolarity 287 within normal range Monitor sodium level daily Started salt tablet 1 g p.o. twice daily   Body mass index is 25.85 kg/m.  Interventions:  Diet: Heart healthy diet DVT Prophylaxis: Therapeutic Anticoagulation with Eliquis    Advance goals of care discussion: Full code  Family Communication: family was present at bedside, at the time of interview.  The pt provided permission to discuss medical plan with the family. Opportunity was given to ask question and all questions were answered satisfactorily.   Disposition:  Pt is from Home, admitted with pneumonia, COVID viral infection, still has respiratory failure on supplemental O2 admission, which precludes a safe discharge. Discharge to SNF, when when stable, most likely in 1 to 2 days.  Subjective: No significant events overnight, patient feels improvement but still has significant shortness of breath with chest congestion. Oxygen saturation improving 90% on room air Patient denied any chest pain or palpitation, no any other complaints.  Physical Exam: General: NAD, lying comfortably Appear in no distress, affect appropriate  Eyes: PERRLA ENT: Oral Mucosa Clear, moist  Neck: no JVD,  Cardiovascular: S1 and S2 Present, no Murmur,  Respiratory: Equal air entry bilaterally, mild bilateral crackles, Mild b/l wheezing. Abdomen: Bowel Sound present, Soft and no tenderness,  Skin: no rashes Extremities: no Pedal edema, no calf  tenderness Neurologic: without any new focal findings Gait not checked due to patient safety concerns  Vitals:   07/08/23 1818 07/08/23 2001 07/09/23 0542 07/09/23 0815  BP:  135/84 (!) 123/94 125/89  Pulse:  (!) 107  99  Resp:  18  15  Temp:  98.7 F (37.1 C) 98.9 F (37.2 C) 98.4 F (36.9 C)  TempSrc:  Oral    SpO2: 91% 93% 93% 92%  Weight:      Height:        Intake/Output Summary (Last 24 hours) at 07/09/2023 1532 Last data filed at 07/09/2023 4098 Gross per 24 hour  Intake --  Output 100 ml  Net -100 ml   Filed Weights   07/04/23 2108  Weight: 77.1 kg    Data Reviewed: I have personally reviewed and interpreted daily labs, tele strips, imagings as discussed above. I reviewed all nursing notes, pharmacy notes, vitals, pertinent old records I have discussed plan of care as described above with RN and patient/family.  CBC: Recent Labs  Lab 07/04/23 2229 07/06/23 0528 07/07/23 0415 07/08/23 0434 07/09/23 0541  WBC 10.7* 11.3* 12.9* 11.9* 10.0  NEUTROABS 8.8*  --   --   --   --   HGB 13.9 12.3* 13.9 12.8* 12.6*  HCT 40.5 35.8* 41.8 36.9* 36.7*  MCV 84.9 83.4 87.3 83.7 83.0  PLT 196 203 251 268 299   Basic Metabolic Panel: Recent Labs  Lab 07/04/23 2229 07/06/23 0528 07/07/23 0415 07/08/23 0434 07/09/23 0541  NA 133* 132* 131* 132* 133*  K 4.2 4.1 4.0 4.1 3.7  CL 102 99 97* 99 102  CO2 21* 24 22 22 22   GLUCOSE 146* 118* 105* 110* 97  BUN 22 24* 23 24* 24*  CREATININE 1.02 1.23 1.23 1.13 1.08  CALCIUM 9.1 8.6* 8.8* 8.4* 8.5*  MG  --   --   --   --  2.0  PHOS  --   --   --   --  2.7    Studies: No results found.  Scheduled Meds:  apixaban  5 mg Oral BID   azelastine  1 spray Each Nare BID   cholecalciferol  1,000 Units Oral Daily   fluticasone  2 spray Each Nare Daily   guaiFENesin  600 mg Oral BID   Ipratropium-Albuterol  1 puff Inhalation Q6H   ketoconazole   Topical Once per day on Monday Thursday   latanoprost  1 drop Both Eyes QHS    levETIRAcetam  1,000 mg Oral Daily   loratadine  10 mg Oral Daily   metoprolol succinate  25 mg Oral Daily   pantoprazole  40 mg Oral Daily   polyethylene glycol  17 g Oral Daily   psyllium  1 packet Oral Daily   rosuvastatin  40 mg Oral Daily   sodium chloride  1 g Oral BID WC   Continuous Infusions:  azithromycin 500 mg (07/09/23 1531)   cefTRIAXone (ROCEPHIN)  IV 1 g (07/09/23 1433)   PRN Meds: acetaminophen **OR** acetaminophen, chlorpheniramine-HYDROcodone, ondansetron **OR** ondansetron (ZOFRAN) IV  Time spent: 35 minutes  Author: Gillis Santa. MD [facility] 07/09/2023 3:32 PM  To reach On-call, see care teams to locate  the attending and reach out to them via [email or web address]. If 7PM-7AM, please contact night-coverage If you still have difficulty reaching the attending provider, please page the [facility] (Director on Call) for [facility] on amion for assistance.

## 2023-07-10 DIAGNOSIS — J189 Pneumonia, unspecified organism: Secondary | ICD-10-CM | POA: Diagnosis not present

## 2023-07-10 LAB — CBC
HCT: 37.3 % — ABNORMAL LOW (ref 39.0–52.0)
Hemoglobin: 12.9 g/dL — ABNORMAL LOW (ref 13.0–17.0)
MCH: 28.4 pg (ref 26.0–34.0)
MCHC: 34.6 g/dL (ref 30.0–36.0)
MCV: 82.2 fL (ref 80.0–100.0)
Platelets: 351 10*3/uL (ref 150–400)
RBC: 4.54 MIL/uL (ref 4.22–5.81)
RDW: 13.5 % (ref 11.5–15.5)
WBC: 9.1 10*3/uL (ref 4.0–10.5)
nRBC: 0 % (ref 0.0–0.2)

## 2023-07-10 LAB — CULTURE, BLOOD (ROUTINE X 2)
Culture: NO GROWTH
Culture: NO GROWTH
Special Requests: ADEQUATE
Special Requests: ADEQUATE

## 2023-07-10 LAB — BASIC METABOLIC PANEL
Anion gap: 10 (ref 5–15)
BUN: 21 mg/dL (ref 8–23)
CO2: 22 mmol/L (ref 22–32)
Calcium: 8.2 mg/dL — ABNORMAL LOW (ref 8.9–10.3)
Chloride: 100 mmol/L (ref 98–111)
Creatinine, Ser: 0.93 mg/dL (ref 0.61–1.24)
GFR, Estimated: >60 mL/min (ref >60)
Glucose, Bld: 109 mg/dL — ABNORMAL HIGH (ref 70–99)
Potassium: 3.4 mmol/L — ABNORMAL LOW (ref 3.5–5.1)
Sodium: 132 mmol/L — ABNORMAL LOW (ref 135–145)

## 2023-07-10 LAB — MAGNESIUM: Magnesium: 2 mg/dL (ref 1.7–2.4)

## 2023-07-10 LAB — PHOSPHORUS: Phosphorus: 3.3 mg/dL (ref 2.5–4.6)

## 2023-07-10 MED ORDER — FUROSEMIDE 10 MG/ML IJ SOLN
20.0000 mg | Freq: Once | INTRAMUSCULAR | Status: AC
  Administered 2023-07-10: 20 mg via INTRAVENOUS
  Filled 2023-07-10: qty 2

## 2023-07-10 MED ORDER — POTASSIUM CHLORIDE CRYS ER 20 MEQ PO TBCR
40.0000 meq | EXTENDED_RELEASE_TABLET | Freq: Once | ORAL | Status: AC
  Administered 2023-07-10: 40 meq via ORAL
  Filled 2023-07-10: qty 2

## 2023-07-10 MED ORDER — DEXAMETHASONE 4 MG PO TABS
6.0000 mg | ORAL_TABLET | Freq: Every day | ORAL | Status: DC
  Administered 2023-07-10 – 2023-07-11 (×2): 6 mg via ORAL
  Filled 2023-07-10 (×2): qty 2

## 2023-07-10 MED ORDER — GUAIFENESIN ER 600 MG PO TB12: 600.0000 mg | ORAL_TABLET | Freq: Two times a day (BID) | ORAL | 0 refills | Status: AC

## 2023-07-10 MED ORDER — DOXYCYCLINE MONOHYDRATE 100 MG PO TABS: 100.0000 mg | ORAL_TABLET | Freq: Two times a day (BID) | ORAL | 0 refills | Status: AC

## 2023-07-10 MED ORDER — HYDROCOD POLI-CHLORPHE POLI ER 10-8 MG/5ML PO SUER: 5.0000 mL | Freq: Two times a day (BID) | ORAL | 0 refills | Status: DC | PRN

## 2023-07-10 NOTE — Consult Note (Signed)
[facility] Liaison Note  07/10/2023  Edwin [name] Sep 15, 1940 [id number]  Location: RN Hospital Liaison screened the patient remotely at [facility].  Insurance: [employer] HMO   Edwin [name] is a 82 y.o. male who is a Primary Care Patient of Dale Manhattan, MD- [facility] [employer].  The patient was screened for readmission hospitalization with noted medium risk score for unplanned readmission risk with 1 IP/0 ED in 6 months.  The patient was assessed for potential Care Management service needs for post hospital transition for care coordination. Review of patient's electronic medical record reveals patient was admit with Pneumonia. Pt will discharge home with [facility]. No other anticipated needs for care management services.   Plan: [facility] Liaison will continue to follow progress and disposition to asess for post hospital community care coordination/management needs.  Referral request for community care coordination: anticipate Transitions of Care Team follow up.   [facility] Care Management/Population Health does not replace or interfere with any arrangements made by the Inpatient Transition of Care team.   For questions contact:   Elliot Cousin, RN, [facility] Liaison [place]   [facility], Population Health Office Hours MTWF  8:00 am-6:00 pm Direct Dial: [phone] mobile [phone] [Office toll free line] Office Hours are M-F 8:30 - 5 pm [name]

## 2023-07-10 NOTE — Progress Notes (Signed)
Mobility Specialist - Progress Note   Pre-mobility: HR(82), BP, SpO2(94) Post-mobility: HR(103), BP, SPO2(93)     07/10/23 1012  Therapy Vitals  Pulse Rate (!) 103 (Post ambulation)  Oxygen Therapy  SpO2 93 %  Mobility  Activity Ambulated with assistance in hallway;Stood at bedside  Level of Assistance Contact guard assist, steadying assist  Assistive Device Front wheel walker  Distance Ambulated (ft) 160 ft  Range of Motion/Exercises Active  Activity Response Tolerated well  Mobility Referral Yes  $Mobility charge 1 Mobility  Mobility Specialist Start Time (ACUTE ONLY) [id number]  Mobility Specialist Stop Time (ACUTE ONLY) 1013  Mobility Specialist Time Calculation (min) (ACUTE ONLY) 22 min   Pt resting in recliner on 1L upon entry. Pt STS and ambulates to hallway around NS CGA with RW. Pt endorses no lightheadeness, SOB, or pain during ambulation. Pt returned to recliner and left with needs in reach. Family present at bedside and LPN entering upon exit.   Johnathan Hausen Mobility Specialist 07/10/23, 10:17 AM

## 2023-07-10 NOTE — TOC Transition Note (Signed)
Transition of Care [facility]) - CM/SW Discharge Note   Patient Details  Name: Edwin [name] MRN: [id number] Date of Birth: 04/29/1941  Transition of Care [facility] CM/SW Contact:  Allena Katz, LCSW Phone Number: 07/10/2023, 12:34 PM   Clinical Narrative:   pt has orders to discharge home with [facility]. RW and BSC delivered to bedside by jon at [facility]. Adelina Mings with [facility] notified.     Final next level of care: Skilled Nursing Facility Barriers to Discharge: Barriers Resolved   Patient Goals and CMS Choice CMS Medicare.gov Compare Post Acute Care list provided to:: Patient    Discharge Placement                      Patient and family notified of of transfer: 07/10/23  Discharge Plan and Services Additional resources added to the After Visit Summary for                  DME Arranged: Walker rolling, 3-N-1 DME Agency: [facility]       HH Arranged: PT, OT HH Agency: [facility]     Representative spoke with at [facility] Agency: kelsey  Social Determinants of Health (SDOH) Interventions SDOH Screenings   Food Insecurity: No Food Insecurity (07/06/2023)  Housing: Low Risk  (07/06/2023)  Transportation Needs: No Transportation Needs (07/06/2023)  Utilities: Not At Risk (07/06/2023)  Alcohol Screen: Low Risk  (03/04/2023)  Depression (PHQ2-9): Low Risk  (03/04/2023)  Financial Resource Strain: Low Risk  (03/04/2023)  Physical Activity: Sufficiently Active (03/04/2023)  Social Connections: Moderately Integrated (03/04/2023)  Stress: No Stress Concern Present (03/04/2023)  Tobacco Use: Low Risk  (07/06/2023)     Readmission Risk Interventions     No data to display

## 2023-07-10 NOTE — Discharge Summary (Signed)
[facility] Discharge Summary   Patient: Edwin [name] XBJ:[id number]  PCP: Dale Kingston, MD  Date of admission: 07/05/2023   Date of discharge: 07/11/2023 07/11/2023     Discharge Diagnoses:  Principal Problem:   Community acquired pneumonia Active Problems:   Pneumonia   Admitted From: Home Disposition:  Home with [facility] PT  Recommendations for Outpatient Follow-up:  Follow-up with PCP in 1 week, repeat CBC and BMP after 1 week, repeat chest x-ray after 4 weeks for resolution of pneumonia  Follow up LABS/TEST:  As Above   Follow-up Information     Dale [place], MD Follow up in 1 week(s).   Specialty: Internal Medicine Contact information: [address] [place] [address] [phone]                Diet recommendation: Cardiac diet  Activity: The patient is advised to gradually reintroduce usual activities, as tolerated  Discharge Condition: stable  Code Status: Full code   History of present illness: As per the H and P dictated on admission Hospital Course:  Edwin [name] is a 82 y.o. male with medical history significant of PAF on Eliquis, CAD stenting, seizure disorder, HTN, HLD, presented with worsening of cough and fever. Symptoms started 7 to 8 days ago, patient started to develop a cough nonproductive dry cough, and went to see his doctor 6 days ago, was diagnosed with COVID.  However was told that out of window for Paxlovid and sent home with short course of doxycycline for 7 days, cough medications.  Initially the cough was severe on Monday and Tuesday then became better on Thursday, remained to be a dry cough however Friday patient started to have a fever of 104 and was given Tylenol.  Yesterday fever was 100.4.  Overnight patient developed shortness of breath and family decided to bring him to the hospital.  Denies any chest pains, no abdominal pain no diarrhea no urinary symptoms no rash.  Denied any cough or choking  after eating or drinking, no history of aspiration pneumonia   ED Course:  Fever 102.2, tachycardia, tachypneic not hypoxic.  Chest x-ray showed left sided lower lobe pneumonia.  Blood work showed WBC 10.7, hemoglobin 13.9, creatinine 1.0, K4.2.  UA showed WBC 6-10.  COVID negative.  Lactic acid 1.2   He was started on ceftriaxone and azithromycin in the ED.   Assessment and Plan:   # CAP: bacterial, post-viral pneumonia. Failed outpatient management. S/p IV rocephin, azithromycin, bronchodilators & encourage incentive spirometry. Legionella, mycoplasma are negative.  Transition to oral antibiotic doxycycline twice daily for 5 days.  Follow with PCP to repeat chest x-ray after 4 weeks for resolution of pneumonia.   # Acute hypoxic respiratory failure due to pneumonia. Resolved  S/p supplemental O2 inhalation and gradually weaned off, sat 90% on ambulation, did drop a little bit but recovered quickly.  No signs of respiratory distress. BNP 338, slightly elevated.  LVEF 60 to 65%, s/p Combivent inhaler every 6 hourly.  No more need of inhalers. Lasix 20 20 mg IV one-time dose given on 11/8.   # COVID-19 infection: out of window for antiviral treatment. Continue w/ supportive care. Continue on airborne & contact precautions. S/p Decadron 6 mg p.o. daily x 2 doses followed by Decadron 4 mg p.o. daily x 3 doses prescribed at discharge.  Decreased dose due to hypoglycemia and significant improvement, may not need higher dose. # Sepsis due to pneumonia, vital signs stable. S/p antibiotics.  Sepsis  resolved. # PAF: continue on metoprolol, eliquis  # Seizure disorder: continue on home dose of keppra  # Hx of CAD: no CP. Continue on home dose of metoprolol   # Vitamin D deficiency: continue on vit D supplement # Isotonic hyponatremia, serum osmolarity 287 within normal range. S/p salt tablet 1 g p.o. twice daily. Na 132 stable.  Repeat BMP after 1 week and follow with PCP for further management as an  outpatient.    Body mass index is 25.85 kg/m.  Nutrition Interventions:  Patient was seen by physical therapy, who recommended Home health, which was arranged. On the day of the discharge the patient's vitals were stable, and no other acute medical condition were reported by patient. the patient was felt safe to be discharge at Home with Home health.  Consultants: None Procedures: None  Discharge Exam: General: Appear in no distress, no Rash; Oral Mucosa Clear, moist. Cardiovascular: S1 and S2 Present, no Murmur, Respiratory: Equal air entry bilaterally, mild crackles, no wheezing. Abdomen: Bowel Sound present, Soft and no tenderness, no hernia Extremities: no Pedal edema, no calf tenderness Neurology: alert and oriented to time, place, and person affect appropriate.  Filed Weights   07/04/23 2108  Weight: 77.1 kg   Vitals:   07/11/23 0438 07/11/23 0834  BP: 107/73 115/69  Pulse: (!) 109 (!) 104  Resp: 18 20  Temp: 97.9 F (36.6 C) 97.6 F (36.4 C)  SpO2: 93% (!) 89%    DISCHARGE MEDICATION: Allergies as of 07/11/2023       Reactions   Ketamine Anxiety, Other (See Comments)   Penicillins Rash   Sulfa Antibiotics Rash        Medication List     STOP taking these medications    aspirin 81 MG tablet   doxycycline 100 MG capsule Commonly known as: VIBRAMYCIN       TAKE these medications    apixaban 5 MG Tabs tablet Commonly known as: ELIQUIS Take 1 tablet (5 mg total) by mouth 2 (two) times daily.   azelastine 0.1 % nasal spray Commonly known as: ASTELIN Place 1 spray into both nostrils 2 (two) times daily.   chlorpheniramine-HYDROcodone 10-8 MG/5ML Commonly known as: TUSSIONEX Take 5 mLs by mouth every 12 (twelve) hours as needed for up to 12 days for cough.   dexamethasone 4 MG tablet Commonly known as: DECADRON Take 1 tablet (4 mg total) by mouth daily for 3 days. Start taking on: July 12, 2023   doxycycline 100 MG tablet Commonly  known as: ADOXA Take 1 tablet (100 mg total) by mouth 2 (two) times daily for 5 days.   Fish Oil 1200 MG Caps Take 1,200 mg by mouth daily.   fluticasone 50 MCG/ACT nasal spray Commonly known as: FLONASE USE 2 SPRAYS IN EACH NOSTRIL EVERY DAY   guaiFENesin 600 MG 12 hr tablet Commonly known as: MUCINEX Take 1 tablet (600 mg total) by mouth 2 (two) times daily for 7 days.   ketoconazole 2 % shampoo Commonly known as: NIZORAL Apply topically. 2-3 times weekly   latanoprost 0.005 % ophthalmic solution Commonly known as: XALATAN Place 1 drop into both eyes at bedtime.   levETIRAcetam 500 MG 24 hr tablet Commonly known as: KEPPRA XR Take 1,000 mg by mouth daily.   levocetirizine 5 MG tablet Commonly known as: XYZAL TAKE 1 TABLET EVERY EVENING   MAGNESIUM GLYCINATE PO Take 240 mg by mouth daily.   metoprolol succinate 25 MG 24 hr tablet Commonly known  as: Toprol XL Take 1 tablet (25 mg total) by mouth daily.   multivitamin tablet Take 1 tablet by mouth daily.   omeprazole 20 MG capsule Commonly known as: PRILOSEC TAKE 1 CAPSULE TWICE DAILY BEFORE MEALS What changed: See the new instructions.   polyethylene glycol 17 g packet Commonly known as: MIRALAX / GLYCOLAX Take 17 g by mouth daily.   Psyllium 100 % Powd 1 TSP Daily   rosuvastatin 40 MG tablet Commonly known as: CRESTOR Take 1 tablet (40 mg total) by mouth daily.   Saw Palmetto 450 MG Caps Take 450 mg by mouth daily.   Vitamin D3 50 MCG (2000 UT) Tabs Take 2,000 Units by mouth daily.   Zinc 50 MG Tabs Take 50 mg by mouth daily.               Durable Medical Equipment  (From admission, onward)           Start     Ordered   07/07/23 1238  For home use only DME Bedside commode  Once       Question:  Patient needs a bedside commode to treat with the following condition  Answer:  Generalized weakness   07/07/23 1237   07/07/23 1238  For home use only DME Walker rolling  Once        Question Answer Comment  Walker: With 5 Inch Wheels   Patient needs a walker to treat with the following condition Generalized weakness      07/07/23 1237           Allergies  Allergen Reactions   Ketamine Anxiety and Other (See Comments)   Penicillins Rash   Sulfa Antibiotics Rash   Discharge Instructions     Call MD for:  difficulty breathing, headache or visual disturbances   Complete by: As directed    Call MD for:  extreme fatigue   Complete by: As directed    Call MD for:  persistant dizziness or light-headedness   Complete by: As directed    Call MD for:  persistant nausea and vomiting   Complete by: As directed    Call MD for:  severe uncontrolled pain   Complete by: As directed    Call MD for:  temperature >100.4   Complete by: As directed    Diet - low sodium heart healthy   Complete by: As directed    Discharge instructions   Complete by: As directed    Follow-up with PCP in 1 week, repeat CBC and BMP after 1 week, repeat chest x-ray after 4 weeks for resolution of pneumonia   Increase activity slowly   Complete by: As directed        The results of significant diagnostics from this hospitalization (including imaging, microbiology, ancillary and laboratory) are listed below for reference.    Significant Diagnostic Studies: DG Chest 1 View  Result Date: 07/06/2023 CLINICAL DATA:  [id number].  Pneumonia follow-up. EXAM: CHEST  1 VIEW COMPARISON:  PA and lateral 07/04/2023. FINDINGS: 6:47 a.m. Cardiomegaly. Mild increased central vascular prominence without overt edema. There is increased patchy hazy airspace disease left mid to lower lung field, probably in the lower lobe, and scattered opacities newly noted in the right lower lung field most likely new areas of pneumonia. There is no substantial pleural effusion. The remaining lungs are clear. Stable mediastinum with mild aortic tortuosity and atherosclerosis. No new osseous abnormality. IMPRESSION: 1. Increased  patchy hazy airspace disease left mid to lower  lung field, probably in the lower lobe, and scattered opacities newly noted in the right lower lung field most likely new areas of pneumonia. 2. Cardiomegaly with mild increased central vascular prominence without overt edema. Electronically Signed   By: Almira Bar M.D.   On: 07/06/2023 07:02   DG Chest 2 View  Result Date: 07/04/2023 CLINICAL DATA:  Cough, fever, COVID EXAM: CHEST - 2 VIEW COMPARISON:  12/31/2020 FINDINGS: Possible mild subpleural patchy opacity in the left mid lung, equivocal. Right lung is essentially clear. No pleural effusion or pneumothorax. The heart is normal in size. Degenerative changes of the visualized thoracolumbar spine. IMPRESSION: Possible mild subpleural patchy opacity in the left mid lung, equivocal, but presumably reflecting pneumonia in this patient with known COVID. Electronically Signed   By: Charline Bills M.D.   On: 07/04/2023 21:35    Microbiology: Recent Results (from the past 240 hour(s))  Resp panel by RT-PCR (RSV, Flu A&B, Covid) Anterior Nasal Swab     Status: Abnormal   Collection Time: 07/05/23  4:01 AM   Specimen: Anterior Nasal Swab  Result Value Ref Range Status   SARS Coronavirus 2 by RT PCR POSITIVE (A) NEGATIVE Final    Comment: (NOTE) SARS-CoV-2 target nucleic acids are DETECTED.  The SARS-CoV-2 RNA is generally detectable in upper respiratory specimens during the acute phase of infection. Positive results are indicative of the presence of the identified virus, but do not rule out bacterial infection or co-infection with other pathogens not detected by the test. Clinical correlation with patient history and other diagnostic information is necessary to determine patient infection status. The expected result is Negative.  Fact Sheet for Patients: BloggerCourse.com  Fact Sheet for Healthcare Providers: SeriousBroker.it  This test is  not yet approved or cleared by the [place] FDA and  has been authorized for detection and/or diagnosis of SARS-CoV-2 by FDA under an Emergency Use Authorization (EUA).  This EUA will remain in effect (meaning this test can be used) for the duration of  the COVID-19 declaration under Section 564(b)(1) of the A ct, 21 U.S.C. section 360bbb-3(b)(1), unless the authorization is terminated or revoked sooner.     Influenza A by PCR NEGATIVE NEGATIVE Final   Influenza B by PCR NEGATIVE NEGATIVE Final    Comment: (NOTE) The Xpert Xpress SARS-CoV-2/FLU/RSV plus assay is intended as an aid in the diagnosis of influenza from Nasopharyngeal swab specimens and should not be used as a sole basis for treatment. Nasal washings and aspirates are unacceptable for Xpert Xpress SARS-CoV-2/FLU/RSV testing.  Fact Sheet for Patients: BloggerCourse.com  Fact Sheet for Healthcare Providers: SeriousBroker.it  This test is not yet approved or cleared by the [place] FDA and has been authorized for detection and/or diagnosis of SARS-CoV-2 by FDA under an Emergency Use Authorization (EUA). This EUA will remain in effect (meaning this test can be used) for the duration of the COVID-19 declaration under Section 564(b)(1) of the Act, 21 U.S.C. section 360bbb-3(b)(1), unless the authorization is terminated or revoked.     Resp Syncytial Virus by PCR NEGATIVE NEGATIVE Final    Comment: (NOTE) Fact Sheet for Patients: BloggerCourse.com  Fact Sheet for Healthcare Providers: SeriousBroker.it  This test is not yet approved or cleared by the [place] FDA and has been authorized for detection and/or diagnosis of SARS-CoV-2 by FDA under an Emergency Use Authorization (EUA). This EUA will remain in effect (meaning this test can be used) for the duration of the COVID-19 declaration under Section  564(b)(1) of the Act, 21 U.S.C. section 360bbb-3(b)(1), unless the authorization is terminated or revoked.  Performed at [facility], [address] [place], [place] [address]   Blood Culture (routine x 2)     Status: None   Collection Time: 07/05/23  4:01 AM   Specimen: BLOOD  Result Value Ref Range Status   Specimen Description BLOOD RIGHT ANTECUBITAL  Final   Special Requests   Final    BOTTLES DRAWN AEROBIC AND ANAEROBIC Blood Culture adequate volume   Culture   Final    NO GROWTH 5 DAYS Performed at [facility], [address] [place], [place] [address]    Report Status 07/10/2023 FINAL  Final  Blood Culture (routine x 2)     Status: None   Collection Time: 07/05/23  4:01 AM   Specimen: BLOOD  Result Value Ref Range Status   Specimen Description BLOOD BLOOD RIGHT FOREARM  Final   Special Requests   Final    BOTTLES DRAWN AEROBIC AND ANAEROBIC Blood Culture adequate volume   Culture   Final    NO GROWTH 5 DAYS Performed at [facility], [address] [place], [place] [address]    Report Status 07/10/2023 FINAL  Final     Labs: CBC: Recent Labs  Lab 07/04/23 2229 07/06/23 0528 07/07/23 0415 07/08/23 0434 07/09/23 0541 07/10/23 0335 07/11/23 0542  WBC 10.7*   < > 12.9* 11.9* 10.0 9.1 7.9  NEUTROABS 8.8*  --   --   --   --   --   --   HGB 13.9   < > 13.9 12.8* 12.6* 12.9* 13.9  HCT 40.5   < > 41.8 36.9* 36.7* 37.3* 40.6  MCV 84.9   < > 87.3 83.7 83.0 82.2 82.2  PLT 196   < > 251 268 299 351 433*   < > = values in this interval not displayed.   Basic Metabolic Panel: Recent Labs  Lab 07/07/23 0415 07/08/23 0434 07/09/23 0541 07/10/23 0335 07/11/23 0542  NA 131* 132* 133* 132* 136  K 4.0 4.1 3.7 3.4* 4.4  CL 97* 99 102 100 104  CO2 22 22 22 22  20*  GLUCOSE 105* 110* 97 109* 151*  BUN 23 24* 24* 21 23  CREATININE 1.23 1.13 1.08 0.93 1.02  CALCIUM 8.8* 8.4* 8.5* 8.2* 8.7*  MG  --   --  2.0 2.0 2.1  PHOS  --   --   2.7 3.3 3.8   Liver Function Tests: No results for input(s): "AST", "ALT", "ALKPHOS", "BILITOT", "PROT", "ALBUMIN" in the last 168 hours. No results for input(s): "LIPASE", "AMYLASE" in the last 168 hours. No results for input(s): "AMMONIA" in the last 168 hours. Cardiac Enzymes: No results for input(s): "CKTOTAL", "CKMB", "CKMBINDEX", "TROPONINI" in the last 168 hours. BNP (last 3 results) Recent Labs    07/08/23 0434  BNP 338.4*   CBG: No results for input(s): "GLUCAP" in the last 168 hours.  Time spent: 35 minutes  Signed:  Gillis Santa  [facility] 11/9/202411/05/2023 2:22 PM

## 2023-07-11 DIAGNOSIS — J189 Pneumonia, unspecified organism: Secondary | ICD-10-CM | POA: Diagnosis not present

## 2023-07-11 LAB — BASIC METABOLIC PANEL
Anion gap: 12 (ref 5–15)
BUN: 23 mg/dL (ref 8–23)
CO2: 20 mmol/L — ABNORMAL LOW (ref 22–32)
Calcium: 8.7 mg/dL — ABNORMAL LOW (ref 8.9–10.3)
Chloride: 104 mmol/L (ref 98–111)
Creatinine, Ser: 1.02 mg/dL (ref 0.61–1.24)
GFR, Estimated: >60 mL/min (ref >60)
Glucose, Bld: 151 mg/dL — ABNORMAL HIGH (ref 70–99)
Potassium: 4.4 mmol/L (ref 3.5–5.1)
Sodium: 136 mmol/L (ref 135–145)

## 2023-07-11 LAB — CBC
HCT: 40.6 % (ref 39.0–52.0)
Hemoglobin: 13.9 g/dL (ref 13.0–17.0)
MCH: 28.1 pg (ref 26.0–34.0)
MCHC: 34.2 g/dL (ref 30.0–36.0)
MCV: 82.2 fL (ref 80.0–100.0)
Platelets: 433 10*3/uL — ABNORMAL HIGH (ref 150–400)
RBC: 4.94 MIL/uL (ref 4.22–5.81)
RDW: 13.5 % (ref 11.5–15.5)
WBC: 7.9 10*3/uL (ref 4.0–10.5)
nRBC: 0 % (ref 0.0–0.2)

## 2023-07-11 LAB — PHOSPHORUS: Phosphorus: 3.8 mg/dL (ref 2.5–4.6)

## 2023-07-11 LAB — MAGNESIUM: Magnesium: 2.1 mg/dL (ref 1.7–2.4)

## 2023-07-11 MED ORDER — DEXTROSE 5 % IV SOLN
500.0000 mg | INTRAVENOUS | Status: DC
  Filled 2023-07-11: qty 5

## 2023-07-11 MED ORDER — DEXAMETHASONE 4 MG PO TABS: 4.0000 mg | ORAL_TABLET | Freq: Every day | ORAL | 0 refills | Status: AC

## 2023-07-11 NOTE — Progress Notes (Signed)
[facility] Progress Note  Patient: Edwin [name]    RUE:[id number]  DOA: 07/05/2023     Date of Service: the patient was seen and examined on 07/11/2023  Chief Complaint  Patient presents with   COVID +   Brief hospital course: Edwin [name] is a 82 y.o. male with medical history significant of PAF on Eliquis, CAD stenting, seizure disorder, HTN, HLD, presented with worsening of cough and fever.   Symptoms started 7 to 8 days ago, patient started to develop a cough nonproductive dry cough, and went to see his doctor 6 days ago, was diagnosed with COVID.  However was told that out of window for Paxlovid and sent home with short course of doxycycline for 7 days, cough medications.  Initially the cough was severe on Monday and Tuesday then became better on Thursday, remained to be a dry cough however Friday patient started to have a fever of 104 and was given Tylenol.  Yesterday fever was 100.4.  Overnight patient developed shortness of breath and family decided to bring him to the hospital.  Denies any chest pains, no abdominal pain no diarrhea no urinary symptoms no rash.  Denied any cough or choking after eating or drinking, no history of aspiration pneumonia   ED Course:  Fever 102.2, tachycardia, tachypneic not hypoxic.  Chest x-ray showed left sided lower lobe pneumonia.  Blood work showed WBC 10.7, hemoglobin 13.9, creatinine 1.0, K4.2.  UA showed WBC 6-10.  COVID negative.  Lactic acid 1.2   He was started on ceftriaxone and azithromycin in the ED.  Assessment and Plan:  # CAP: bacterial, post-viral pneumonia. Failed outpatient management. Continue on IV rocephin, azithromycin, bronchodilators & encourage incentive spirometry. Legionella, mycoplasma are negative  # Hypoxic respiratory failure due to pneumonia.  Continue supplemental O2 inhalation and gradually wean off BNP 338, slightly elevated.  LVEF 60 to 65% Continue Combivent inhaler every 6 hourly Lasix 21 mg time 1  dose IV given, started Decadron 6 mg p.o. daily Patient had low oxygen 89% on room air at rest, we will continue to monitor today and plan for discharge tomorrow a.m.  # COVID-19 infection: out of window for antiviral treatment. Continue w/ supportive care. Continue on airborne & contact precautions    # Sepsis due to pneumonia, vital signs stable.  Continue antibiotics   # PAF: continue on metoprolol, eliquis    # Seizure disorder: continue on home dose of keppra    # Hx of CAD: no CP. Continue on home dose of metoprolol    Deconditioning: PT recs HH. OT recs SNF    Vitamin D deficiency: continue on vit D supplement  Isotonic hyponatremia, serum osmolarity 287 within normal range Monitor sodium level daily Started salt tablet 1 g p.o. twice daily   Body mass index is 25.85 kg/m.  Interventions:  Diet: Heart healthy diet DVT Prophylaxis: Therapeutic Anticoagulation with Eliquis    Advance goals of care discussion: Full code  Family Communication: family was present at bedside, at the time of interview.  The pt provided permission to discuss medical plan with the family. Opportunity was given to ask question and all questions were answered satisfactorily.   Disposition:  Pt is from Home, admitted with pneumonia, COVID viral infection, still has respiratory failure on supplemental O2 admission, which precludes a safe discharge. Discharge to SNF, when when stable, most likely in 1 to 2 days.  Subjective: No significant events overnight, overall patient feels improvement, still has some chest  congestion and productive cough.  No any other complaints.  Physical Exam: General: NAD, lying comfortably Appear in no distress, affect appropriate Eyes: PERRLA ENT: Oral Mucosa Clear, moist  Neck: no JVD,  Cardiovascular: S1 and S2 Present, no Murmur,  Respiratory: Equal air entry bilaterally, mild bilateral crackles, Mild b/l wheezing. Abdomen: Bowel Sound present, Soft and no  tenderness,  Skin: no rashes Extremities: no Pedal edema, no calf tenderness Neurologic: without any new focal findings Gait not checked due to patient safety concerns  Vitals:   07/10/23 1651 07/10/23 2057 07/11/23 0438 07/11/23 0834  BP: 126/80 122/78 107/73 115/69  Pulse:  99 (!) 109 (!) 104  Resp: 20 19 18 20   Temp: 98.4 F (36.9 C) 98.4 F (36.9 C) 97.9 F (36.6 C) 97.6 F (36.4 C)  TempSrc:  Oral Oral Oral  SpO2: (!) 89% 91% 93% (!) 89%  Weight:      Height:       No intake or output data in the 24 hours ending 07/11/23 1108  Filed Weights   07/04/23 2108  Weight: 77.1 kg    Data Reviewed: I have personally reviewed and interpreted daily labs, tele strips, imagings as discussed above. I reviewed all nursing notes, pharmacy notes, vitals, pertinent old records I have discussed plan of care as described above with RN and patient/family.  CBC: Recent Labs  Lab 07/04/23 2229 07/06/23 0528 07/07/23 0415 07/08/23 0434 07/09/23 0541 07/10/23 0335 07/11/23 0542  WBC 10.7*   < > 12.9* 11.9* 10.0 9.1 7.9  NEUTROABS 8.8*  --   --   --   --   --   --   HGB 13.9   < > 13.9 12.8* 12.6* 12.9* 13.9  HCT 40.5   < > 41.8 36.9* 36.7* 37.3* 40.6  MCV 84.9   < > 87.3 83.7 83.0 82.2 82.2  PLT 196   < > 251 268 299 351 433*   < > = values in this interval not displayed.   Basic Metabolic Panel: Recent Labs  Lab 07/07/23 0415 07/08/23 0434 07/09/23 0541 07/10/23 0335 07/11/23 0542  NA 131* 132* 133* 132* 136  K 4.0 4.1 3.7 3.4* 4.4  CL 97* 99 102 100 104  CO2 22 22 22 22  20*  GLUCOSE 105* 110* 97 109* 151*  BUN 23 24* 24* 21 23  CREATININE 1.23 1.13 1.08 0.93 1.02  CALCIUM 8.8* 8.4* 8.5* 8.2* 8.7*  MG  --   --  2.0 2.0 2.1  PHOS  --   --  2.7 3.3 3.8    Studies: No results found.  Scheduled Meds:  apixaban  5 mg Oral BID   azelastine  1 spray Each Nare BID   cholecalciferol  1,000 Units Oral Daily   dexamethasone  6 mg Oral Daily   fluticasone  2 spray Each  Nare Daily   guaiFENesin  600 mg Oral BID   Ipratropium-Albuterol  1 puff Inhalation Q6H   ketoconazole   Topical Once per day on Monday Thursday   latanoprost  1 drop Both Eyes QHS   levETIRAcetam  1,000 mg Oral Daily   loratadine  10 mg Oral Daily   metoprolol succinate  25 mg Oral Daily   pantoprazole  40 mg Oral Daily   polyethylene glycol  17 g Oral Daily   psyllium  1 packet Oral Daily   rosuvastatin  40 mg Oral Daily   sodium chloride  1 g Oral BID WC   Continuous  Infusions:  azithromycin     cefTRIAXone (ROCEPHIN)  IV 1 g (07/10/23 1147)   PRN Meds: acetaminophen **OR** acetaminophen, chlorpheniramine-HYDROcodone, ondansetron **OR** ondansetron (ZOFRAN) IV  Time spent: 35 minutes  Author: Gillis Santa. MD [facility] 07/11/2023 11:08 AM  To reach On-call, see care teams to locate the attending and reach out to them via [email or web address]. If 7PM-7AM, please contact night-coverage If you still have difficulty reaching the attending provider, please page the [facility] (Director on Call) for [facility] on amion for assistance.

## 2023-07-11 NOTE — Plan of Care (Signed)
Patient remained on room air throughout shift. Pulse oximetry on room air is 93% during last vital sign check.  Problem: Education: Goal: Knowledge of risk factors and measures for prevention of condition will improve Outcome: Progressing   Problem: Coping: Goal: Psychosocial and spiritual needs will be supported Outcome: Progressing   Problem: Respiratory: Goal: Will maintain a patent airway Outcome: Progressing Goal: Complications related to the disease process, condition or treatment will be avoided or minimized Outcome: Progressing   Problem: Education: Goal: Knowledge of General Education information will improve Description: Including pain rating scale, medication(s)/side effects and non-pharmacologic comfort measures Outcome: Progressing   Problem: Clinical Measurements: Goal: Ability to maintain clinical measurements within normal limits will improve Outcome: Progressing Goal: Will remain free from infection Outcome: Progressing Goal: Diagnostic test results will improve Outcome: Progressing Goal: Respiratory complications will improve Outcome: Progressing Goal: Cardiovascular complication will be avoided Outcome: Progressing

## 2023-07-11 NOTE — TOC Transition Note (Signed)
Transition of Care [facility]) - CM/SW Discharge Note   Patient Details  Name: Edwin [name] MRN: [id number] Date of Birth: 1940-12-03  Transition of Care [facility]) CM/SW Contact:  Kemper Durie, RN Phone Number: 07/11/2023, 12:41 PM   Clinical Narrative:     Patient to discharge home today, family will provide transportation.  Rhonda with [facility] notified of discharge.  No further TOC needs identified.   Final next level of care: Home w Home Health Services Barriers to Discharge: Barriers Resolved   Patient Goals and CMS Choice CMS Medicare.gov Compare Post Acute Care list provided to:: Patient    Discharge Placement                      Patient and family notified of of transfer: 07/10/23  Discharge Plan and Services Additional resources added to the After Visit Summary for                  DME Arranged: Walker rolling, 3-N-1 DME Agency: [employer]       HH Arranged: PT, OT HH Agency: [facility] Date HH Agency Contacted: 07/11/23 Time HH Agency Contacted: 1240 Representative spoke with at [facility] Agency: Bjorn Loser  Social Determinants of Health (SDOH) Interventions SDOH Screenings   Food Insecurity: No Food Insecurity (07/06/2023)  Housing: Low Risk  (07/06/2023)  Transportation Needs: No Transportation Needs (07/06/2023)  Utilities: Not At Risk (07/06/2023)  Alcohol Screen: Low Risk  (03/04/2023)  Depression (PHQ2-9): Low Risk  (03/04/2023)  Financial Resource Strain: Low Risk  (03/04/2023)  Physical Activity: Sufficiently Active (03/04/2023)  Social Connections: Moderately Integrated (03/04/2023)  Stress: No Stress Concern Present (03/04/2023)  Tobacco Use: Low Risk  (07/06/2023)     Readmission Risk Interventions     No data to display

## 2023-07-13 ENCOUNTER — Telehealth: Payer: Self-pay

## 2023-07-13 NOTE — Transitions of Care (Post Inpatient/ED Visit) (Signed)
07/13/2023  Name: Edwin [name] MRN: [id number] DOB: Jan 20, 1941  Today's TOC FU Call Status: Today's TOC FU Call Status:: Successful TOC FU Call Completed TOC FU Call Complete Date: 07/13/23 Patient's Name and Date of Birth confirmed.  Transition Care Management Follow-up Telephone Call Date of Discharge: 07/11/23 Discharge Facility: [facility]) Type of Discharge: Inpatient Admission Primary Inpatient Discharge Diagnosis:: Pneumonia How have you been since you were released from the hospital?: Better Any questions or concerns?: No  Items Reviewed: Did you receive and understand the discharge instructions provided?: Yes Medications obtained,verified, and reconciled?: Yes (Medications Reviewed) Any new allergies since your discharge?: No Dietary orders reviewed?: NA Do you have support at home?: Yes People in Home: spouse Name of Support/Comfort Primary Source: Alice  Medications Reviewed Today: Medications Reviewed Today     Reviewed by Redge Gainer, RN (Case Manager) on 07/13/23 at 1359  Med List Status: <None>   Medication Order Taking? Sig Documenting Provider Last Dose Status Informant  apixaban (ELIQUIS) 5 MG TABS tablet [id number] No Take 1 tablet (5 mg total) by mouth 2 (two) times daily. Antonieta Iba, MD 07/04/2023 pm Active Spouse/Significant Other  azelastine (ASTELIN) 0.1 % nasal spray [id number] No Place 1 spray into both nostrils 2 (two) times daily. Dale Burke, MD 07/04/2023 Active Spouse/Significant Other  chlorpheniramine-HYDROcodone (TUSSIONEX) 10-8 MG/5ML [id number]  Take 5 mLs by mouth every 12 (twelve) hours as needed for up to 12 days for cough. Gillis Santa, MD  Active   Cholecalciferol (VITAMIN D3) 2000 UNITS TABS [id number] No Take 2,000 Units by mouth daily.  [provider] Taking Active   dexamethasone (DECADRON) 4 MG tablet [id number]  Take 1 tablet (4 mg total) by mouth daily for 3 days. Gillis Santa, MD  Active   doxycycline (ADOXA) 100 MG tablet [id number]  Take 1 tablet (100 mg total) by mouth 2 (two) times daily for 5 days. Gillis Santa, MD  Active   fluticasone [facility]) 50 MCG/ACT nasal spray [id number] No USE 2 SPRAYS IN [facility] NOSTRIL EVERY Donne Anon, MD 07/04/2023 Active Spouse/Significant Other  guaiFENesin (MUCINEX) 600 MG 12 hr tablet [id number]  Take 1 tablet (600 mg total) by mouth 2 (two) times daily for 7 days. Gillis Santa, MD  Active   ketoconazole (NIZORAL) 2 % shampoo [id number] No Apply topically. 2-3 times weekly [provider] Taking Active   latanoprost (XALATAN) 0.005 % ophthalmic solution [id number] No Place 1 drop into both eyes at bedtime. [provider] 07/03/2023 Active Spouse/Significant Other  levETIRAcetam (KEPPRA XR) 500 MG 24 hr tablet [id number] No Take 1,000 mg by mouth daily. [provider] 07/03/2023 Expired 07/05/23 2359 Spouse/Significant Other  levocetirizine (XYZAL) 5 MG tablet [id number] No TAKE 1 TABLET EVERY Brett Canales, MD 07/03/2023 Active Spouse/Significant Other  MAGNESIUM GLYCINATE PO [id number] No Take 240 mg by mouth daily. [provider] Taking Active   metoprolol succinate (TOPROL XL) 25 MG 24 hr tablet [id number] No Take 1 tablet (25 mg total) by mouth daily. Antonieta Iba, MD 07/04/2023 pm Active Spouse/Significant Other  Multiple Vitamin (MULTIVITAMIN) tablet [id number] No Take 1 tablet by mouth daily. [provider] Taking Active Spouse/Significant Other  Omega-3 Fatty Acids (FISH OIL) 1200 MG CAPS [id number] No Take 1,200 mg by mouth daily. [provider] Taking Active   omeprazole (PRILOSEC) 20 MG capsule [id number] No TAKE 1 CAPSULE TWICE DAILY BEFORE MEALS  Patient taking differently: 40 mg in the morning.   Dale Harleigh,  MD 07/04/2023 Active Spouse/Significant Other  polyethylene glycol (MIRALAX / GLYCOLAX) 17 g packet [id number] No Take 17 g by mouth daily.  [provider] Taking Active   Psyllium 100 % POWD [id number] No 1 TSP Daily [provider] Taking Active   rosuvastatin (CRESTOR) 40 MG tablet [id number] No Take 1 tablet (40 mg total) by mouth daily. Dale [place], MD 07/03/2023 pm Active Spouse/Significant Other  Saw Palmetto 450 MG CAPS [id number] No Take 450 mg by mouth daily.  [provider] Taking Active   Zinc 50 MG TABS [id number] No Take 50 mg by mouth daily. [provider] Taking Active             Home Care and Equipment/Supplies: Were Home Health Services Ordered?: Yes Name of Home Health Agency:: [facility] Has Agency set up a time to come to your home?: Yes First Home Health Visit Date: 07/15/23 Any new equipment or medical supplies ordered?: Yes Name of Medical supply agency?: [facility] Were you able to get the equipment/medical supplies?: Yes Do you have any questions related to the use of the equipment/supplies?: No  Functional Questionnaire: Do you need assistance with bathing/showering or dressing?: No Do you need assistance with meal preparation?: No Do you need assistance with eating?: No Do you have difficulty maintaining continence: No Do you need assistance with getting out of bed/getting out of a chair/moving?: No Do you have difficulty managing or taking your medications?: No  Follow up appointments reviewed: PCP Follow-up appointment confirmed?: Yes Date of PCP follow-up appointment?: 07/21/23 Follow-up Provider: Dale Bolivar [facility] Follow-up appointment confirmed?: NA Do you need transportation to your follow-up appointment?: No Do you understand care options if your condition(s) worsen?: Yes-patient verbalized understanding  SDOH Interventions Today    Flowsheet Row Most Recent Value  SDOH Interventions   Food Insecurity Interventions Intervention Not Indicated  Transportation Interventions Intervention Not Indicated      The patient is feeling  better since discharged home. He has all of his medications. His  coughing has decreased. HHPT will start on Wednesday 07/15/23. He does feel a little weak. He declines the 30 day outreach program.  Deidre Ala, RN RN Care Manager [facility] [phone]

## 2023-07-14 ENCOUNTER — Telehealth: Payer: Self-pay | Admitting: Internal Medicine

## 2023-07-14 NOTE — Telephone Encounter (Signed)
Patient needs a refill on his medication. The name is metoprolol succinate (TOPROL XL) 25 MG 24 hr tablet. The pharmacy he use is [facility] - [place], [place] - [address] [place] [address] Phone: [phone]  Fax: [phone]  His number is [phone]

## 2023-07-14 NOTE — Telephone Encounter (Signed)
This is refilled by Dr Mariah Milling. Wife is aware.

## 2023-07-15 DIAGNOSIS — I480 Paroxysmal atrial fibrillation: Secondary | ICD-10-CM | POA: Diagnosis not present

## 2023-07-15 DIAGNOSIS — J1282 Pneumonia due to coronavirus disease 2019: Secondary | ICD-10-CM | POA: Diagnosis not present

## 2023-07-15 DIAGNOSIS — I10 Essential (primary) hypertension: Secondary | ICD-10-CM | POA: Diagnosis not present

## 2023-07-15 DIAGNOSIS — H409 Unspecified glaucoma: Secondary | ICD-10-CM | POA: Diagnosis not present

## 2023-07-15 DIAGNOSIS — I2510 Atherosclerotic heart disease of native coronary artery without angina pectoris: Secondary | ICD-10-CM | POA: Diagnosis not present

## 2023-07-15 DIAGNOSIS — G40909 Epilepsy, unspecified, not intractable, without status epilepticus: Secondary | ICD-10-CM | POA: Diagnosis not present

## 2023-07-15 DIAGNOSIS — A419 Sepsis, unspecified organism: Secondary | ICD-10-CM | POA: Diagnosis not present

## 2023-07-15 DIAGNOSIS — U071 COVID-19: Secondary | ICD-10-CM | POA: Diagnosis not present

## 2023-07-15 DIAGNOSIS — M1990 Unspecified osteoarthritis, unspecified site: Secondary | ICD-10-CM | POA: Diagnosis not present

## 2023-07-16 DIAGNOSIS — M1990 Unspecified osteoarthritis, unspecified site: Secondary | ICD-10-CM | POA: Diagnosis not present

## 2023-07-16 DIAGNOSIS — A419 Sepsis, unspecified organism: Secondary | ICD-10-CM | POA: Diagnosis not present

## 2023-07-16 DIAGNOSIS — H409 Unspecified glaucoma: Secondary | ICD-10-CM | POA: Diagnosis not present

## 2023-07-16 DIAGNOSIS — J1282 Pneumonia due to coronavirus disease 2019: Secondary | ICD-10-CM | POA: Diagnosis not present

## 2023-07-16 DIAGNOSIS — G40909 Epilepsy, unspecified, not intractable, without status epilepticus: Secondary | ICD-10-CM | POA: Diagnosis not present

## 2023-07-16 DIAGNOSIS — I480 Paroxysmal atrial fibrillation: Secondary | ICD-10-CM | POA: Diagnosis not present

## 2023-07-16 DIAGNOSIS — I10 Essential (primary) hypertension: Secondary | ICD-10-CM | POA: Diagnosis not present

## 2023-07-16 DIAGNOSIS — U071 COVID-19: Secondary | ICD-10-CM | POA: Diagnosis not present

## 2023-07-16 DIAGNOSIS — I2510 Atherosclerotic heart disease of native coronary artery without angina pectoris: Secondary | ICD-10-CM | POA: Diagnosis not present

## 2023-07-20 DIAGNOSIS — G4733 Obstructive sleep apnea (adult) (pediatric): Secondary | ICD-10-CM | POA: Diagnosis not present

## 2023-07-20 DIAGNOSIS — I2510 Atherosclerotic heart disease of native coronary artery without angina pectoris: Secondary | ICD-10-CM | POA: Diagnosis not present

## 2023-07-20 DIAGNOSIS — I10 Essential (primary) hypertension: Secondary | ICD-10-CM | POA: Diagnosis not present

## 2023-07-20 DIAGNOSIS — Z8616 Personal history of COVID-19: Secondary | ICD-10-CM | POA: Diagnosis not present

## 2023-07-20 DIAGNOSIS — I700 Atherosclerosis of aorta: Secondary | ICD-10-CM | POA: Diagnosis not present

## 2023-07-20 DIAGNOSIS — E7800 Pure hypercholesterolemia, unspecified: Secondary | ICD-10-CM | POA: Diagnosis not present

## 2023-07-20 DIAGNOSIS — I25119 Atherosclerotic heart disease of native coronary artery with unspecified angina pectoris: Secondary | ICD-10-CM | POA: Diagnosis not present

## 2023-07-20 DIAGNOSIS — R569 Unspecified convulsions: Secondary | ICD-10-CM | POA: Diagnosis not present

## 2023-07-20 DIAGNOSIS — I4891 Unspecified atrial fibrillation: Secondary | ICD-10-CM | POA: Diagnosis not present

## 2023-07-21 ENCOUNTER — Ambulatory Visit: Payer: Medicare HMO | Admitting: Internal Medicine

## 2023-07-21 VITALS — BP 118/70 | HR 77 | Temp 98.0°F | Resp 16 | Ht 68.0 in | Wt 168.0 lb

## 2023-07-21 DIAGNOSIS — I700 Atherosclerosis of aorta: Secondary | ICD-10-CM

## 2023-07-21 DIAGNOSIS — I4891 Unspecified atrial fibrillation: Secondary | ICD-10-CM

## 2023-07-21 DIAGNOSIS — R739 Hyperglycemia, unspecified: Secondary | ICD-10-CM

## 2023-07-21 DIAGNOSIS — R531 Weakness: Secondary | ICD-10-CM | POA: Diagnosis not present

## 2023-07-21 DIAGNOSIS — I25119 Atherosclerotic heart disease of native coronary artery with unspecified angina pectoris: Secondary | ICD-10-CM | POA: Diagnosis not present

## 2023-07-21 DIAGNOSIS — J189 Pneumonia, unspecified organism: Secondary | ICD-10-CM | POA: Diagnosis not present

## 2023-07-21 DIAGNOSIS — E7800 Pure hypercholesterolemia, unspecified: Secondary | ICD-10-CM | POA: Diagnosis not present

## 2023-07-21 DIAGNOSIS — D75839 Thrombocytosis, unspecified: Secondary | ICD-10-CM | POA: Insufficient documentation

## 2023-07-21 DIAGNOSIS — G4730 Sleep apnea, unspecified: Secondary | ICD-10-CM

## 2023-07-21 DIAGNOSIS — I10 Essential (primary) hypertension: Secondary | ICD-10-CM

## 2023-07-21 DIAGNOSIS — G40909 Epilepsy, unspecified, not intractable, without status epilepticus: Secondary | ICD-10-CM

## 2023-07-21 DIAGNOSIS — K219 Gastro-esophageal reflux disease without esophagitis: Secondary | ICD-10-CM

## 2023-07-21 LAB — CBC WITH DIFFERENTIAL/PLATELET
Basophils Absolute: 0.1 10*3/uL (ref 0.0–0.1)
Basophils Relative: 1.1 % (ref 0.0–3.0)
Eosinophils Absolute: 0.1 10*3/uL (ref 0.0–0.7)
Eosinophils Relative: 1.1 % (ref 0.0–5.0)
HCT: 41.6 % (ref 39.0–52.0)
Hemoglobin: 13.8 g/dL (ref 13.0–17.0)
Lymphocytes Relative: 20.2 % (ref 12.0–46.0)
Lymphs Abs: 1.5 10*3/uL (ref 0.7–4.0)
MCHC: 33.3 g/dL (ref 30.0–36.0)
MCV: 86.3 fL (ref 78.0–100.0)
Monocytes Absolute: 0.8 10*3/uL (ref 0.1–1.0)
Monocytes Relative: 10.2 % (ref 3.0–12.0)
Neutro Abs: 5.1 10*3/uL (ref 1.4–7.7)
Neutrophils Relative %: 67.4 % (ref 43.0–77.0)
Platelets: 350 10*3/uL (ref 150.0–400.0)
RBC: 4.81 Mil/uL (ref 4.22–5.81)
RDW: 15 % (ref 11.5–15.5)
WBC: 7.6 10*3/uL (ref 4.0–10.5)

## 2023-07-21 LAB — BASIC METABOLIC PANEL
BUN: 14 mg/dL (ref 6–23)
CO2: 27 meq/L (ref 19–32)
Calcium: 9 mg/dL (ref 8.4–10.5)
Chloride: 105 meq/L (ref 96–112)
Creatinine, Ser: 1.11 mg/dL (ref 0.40–1.50)
GFR: 62.02 mL/min (ref >60.00)
Glucose, Bld: 78 mg/dL (ref 70–99)
Potassium: 4.2 meq/L (ref 3.5–5.1)
Sodium: 138 meq/L (ref 135–145)

## 2023-07-21 MED ORDER — MUPIROCIN 2 % EX OINT: 1.0000 | TOPICAL_OINTMENT | Freq: Two times a day (BID) | CUTANEOUS | 0 refills | Status: DC

## 2023-07-21 NOTE — Progress Notes (Signed)
Subjective:    Patient ID: Edwin [name], male    DOB: [birth year], 82 y.o.   MRN: [id number]  Patient here for  Chief Complaint  Patient presents with   Hospitalization Follow-up    HPI Here for hospital follow up.  Hospitalized 07/05/23 - 07/11/23 - [facility].  Symptoms started approximately one week prior to admission.  Was diagnosed with covid. Started on oral doxycycline. Improved initially, but then worsening symptoms with fever and increased sob. To ER - CXR with LLL pneumonia. Started on ceftriaxone and azithromycin. Also received bronchodilators.  Transitioned to oral doxycycline.  Required oxygen while in hospital.  Was gradually weaned. EF 60-65%. Also given decadron in hospital. Also found to be hyponatremic.  Started on salt tablets. PT evaluated and recommended Home health PT. Since his discharged, he has improved.  Cough and congestion improved.  Breathing improved.  Completed abx end of last week.  No increased sob.  No vomiting.  Discussed the need for home health.     Past Medical History:  Diagnosis Date   Allergy    Arthritis    Coronary artery disease    a. 12/2013 PCI: LM nl, LAD 50p, 80m (3.0x28 Alpine Xience DES), D1 90, LCX nl, OM1 70, RCA nl; b. 12/2022 MV: apical-mid inflat ischemia, mildly reduced EF.   Diverticulosis of colon    GERD (gastroesophageal reflux disease)    ESOPHAGEAL REFLUX   Glaucoma    Gout    Hemorrhoids    History of kidney stones    Hx of colonic polyp    Hypercholesterolemia    Hypertension    LVH (left ventricular hypertrophy)    a. 12/2022 Echo: EF 60-65%, no rwma, mod conc LVH. Nl RV fxn. RVSP . Mild to mod LAE. Mild MR/AI.   Schatzki's ring    Seizures (HCC)    Past Surgical History:  Procedure Laterality Date   CARDIAC CATHETERIZATION     COLONOSCOPY WITH PROPOFOL N/A 05/18/2017   Procedure: COLONOSCOPY WITH PROPOFOL;  Surgeon: Scot Jun, MD;  Location: [facility];  Service: Endoscopy;  Laterality: N/A;    CORONARY ANGIOPLASTY     EYE SURGERY  07/20/13   cataract   HERNIA REPAIR Right 2008 & 2012   VASECTOMY  1984   Family History  Problem Relation Age of Onset   Arthritis Mother    Heart disease Mother    Stroke Mother    Hypertension Mother    Arthritis Father    Hyperlipidemia Father    Heart disease Father    Hypertension Father    Parkinson's disease Father    Multiple sclerosis Sister    Colon cancer Maternal Grandmother    Arthritis Paternal Grandmother    Colon cancer Paternal Grandmother    Social History   Socioeconomic History   Marital status: Married    Spouse name: Not on file   Number of children: Not on file   Years of education: Not on file   Highest education level: Not on file  Occupational History   Not on file  Tobacco Use   Smoking status: Never   Smokeless tobacco: Never  Vaping Use   Vaping status: Never Used  Substance and Sexual Activity   Alcohol use: No    Alcohol/week: 0.0 standard drinks of alcohol   Drug use: No   Sexual activity: Never  Other Topics Concern   Not on file  Social History Narrative   Not on file   Social Determinants  of Health   Financial Resource Strain: Low Risk  (03/04/2023)   Overall Financial Resource Strain (CARDIA)    Difficulty of Paying Living Expenses: Not hard at all  Food Insecurity: No Food Insecurity (07/13/2023)   Hunger Vital Sign    Worried About Running Out of Food in the Last Year: Never true    Ran Out of Food in the Last Year: Never true  Transportation Needs: No Transportation Needs (07/13/2023)   PRAPARE - Administrator, Civil Service (Medical): No    Lack of Transportation (Non-Medical): No  Physical Activity: Sufficiently Active (03/04/2023)   Exercise Vital Sign    Days of Exercise per Week: 5 days    Minutes of Exercise per Session: 30 min  Stress: No Stress Concern Present (03/04/2023)   [employer] of Occupational Health - Occupational Stress Questionnaire    Feeling  of Stress : Not at all  Social Connections: Moderately Integrated (03/04/2023)   Social Connection and Isolation Panel [NHANES]    Frequency of Communication with Friends and Family: More than three times a week    Frequency of Social Gatherings with Friends and Family: More than three times a week    Attends Religious Services: 1 to 4 times per year    Active Member of [employer] or Organizations: No    Attends Banker Meetings: Never    Marital Status: Married     Review of Systems  Constitutional:  Negative for fever and unexpected weight change.  HENT:  Negative for congestion and sinus pressure.   Respiratory:  Negative for cough, chest tightness and shortness of breath.   Cardiovascular:  Negative for chest pain and palpitations.  Gastrointestinal:  Negative for abdominal pain, diarrhea, nausea and vomiting.  Genitourinary:  Negative for difficulty urinating and dysuria.  Musculoskeletal:  Negative for joint swelling and myalgias.  Skin:  Negative for color change and rash.  Neurological:  Negative for dizziness and headaches.  Psychiatric/Behavioral:  Negative for agitation and dysphoric mood.        Objective:     BP 118/70   Pulse 77   Temp 98 F (36.7 C)   Resp 16   Ht 5\' 8"  (1.727 m)   Wt 168 lb (76.2 kg)   SpO2 98%   BMI 25.54 kg/m  Wt Readings from Last 3 Encounters:  07/21/23 168 lb (76.2 kg)  07/04/23 170 lb (77.1 kg)  06/04/23 175 lb 3.2 oz (79.5 kg)    Physical Exam Vitals reviewed.  Constitutional:      General: He is not in acute distress.    Appearance: Normal appearance. He is well-developed.  HENT:     Head: Normocephalic and atraumatic.     Right Ear: External ear normal.     Left Ear: External ear normal.  Eyes:     General: No scleral icterus.       Right eye: No discharge.        Left eye: No discharge.     Conjunctiva/sclera: Conjunctivae normal.  Cardiovascular:     Rate and Rhythm: Normal rate and regular rhythm.   Pulmonary:     Effort: Pulmonary effort is normal. No respiratory distress.     Breath sounds: Normal breath sounds.  Abdominal:     General: Bowel sounds are normal.     Palpations: Abdomen is soft.     Tenderness: There is no abdominal tenderness.  Musculoskeletal:        General: No swelling  or tenderness.     Cervical back: Neck supple. No tenderness.  Lymphadenopathy:     Cervical: No cervical adenopathy.  Skin:    Findings: No erythema or rash.  Neurological:     Mental Status: He is alert.  Psychiatric:        Mood and Affect: Mood normal.        Behavior: Behavior normal.      Outpatient Encounter Medications as of 07/21/2023  Medication Sig   mupirocin ointment (BACTROBAN) 2 % Apply 1 Application topically 2 (two) times daily.   apixaban (ELIQUIS) 5 MG TABS tablet Take 1 tablet (5 mg total) by mouth 2 (two) times daily.   azelastine (ASTELIN) 0.1 % nasal spray Place 1 spray into both nostrils 2 (two) times daily.   Cholecalciferol (VITAMIN D3) 2000 UNITS TABS Take 2,000 Units by mouth daily.    fluticasone (FLONASE) 50 MCG/ACT nasal spray USE 2 SPRAYS IN EACH NOSTRIL EVERY DAY   ketoconazole (NIZORAL) 2 % shampoo Apply topically. 2-3 times weekly   latanoprost (XALATAN) 0.005 % ophthalmic solution Place 1 drop into both eyes at bedtime.   levETIRAcetam (KEPPRA XR) 500 MG 24 hr tablet Take 1,000 mg by mouth daily.   levocetirizine (XYZAL) 5 MG tablet TAKE 1 TABLET EVERY EVENING   MAGNESIUM GLYCINATE PO Take 240 mg by mouth daily.   metoprolol succinate (TOPROL XL) 25 MG 24 hr tablet Take 1 tablet (25 mg total) by mouth daily.   Multiple Vitamin (MULTIVITAMIN) tablet Take 1 tablet by mouth daily.   omeprazole (PRILOSEC) 20 MG capsule TAKE 1 CAPSULE TWICE DAILY BEFORE MEALS (Patient taking differently: 40 mg in the morning.)   polyethylene glycol (MIRALAX / GLYCOLAX) 17 g packet Take 17 g by mouth daily.   Psyllium 100 % POWD 1 TSP Daily   rosuvastatin (CRESTOR) 40 MG  tablet Take 1 tablet (40 mg total) by mouth daily.   Saw Palmetto 450 MG CAPS Take 450 mg by mouth daily.    Zinc 50 MG TABS Take 50 mg by mouth daily.   [DISCONTINUED] chlorpheniramine-HYDROcodone (TUSSIONEX) 10-8 MG/5ML Take 5 mLs by mouth every 12 (twelve) hours as needed for up to 12 days for cough.   [DISCONTINUED] Omega-3 Fatty Acids (FISH OIL) 1200 MG CAPS Take 1,200 mg by mouth daily.   No facility-administered encounter medications on file as of 07/21/2023.     Lab Results  Component Value Date   WBC 7.6 07/21/2023   HGB 13.8 07/21/2023   HCT 41.6 07/21/2023   PLT 350.0 07/21/2023   GLUCOSE 78 07/21/2023   CHOL 116 06/01/2023   TRIG 176.0 (H) 06/01/2023   HDL 32.70 (L) 06/01/2023   LDLDIRECT 99.1 07/29/2013   LDLCALC 49 06/01/2023   ALT 28 06/01/2023   AST 27 06/01/2023   NA 138 07/21/2023   K 4.2 07/21/2023   CL 105 07/21/2023   CREATININE 1.11 07/21/2023   BUN 14 07/21/2023   CO2 27 07/21/2023   TSH 3.42 01/20/2023   PSA 1.04 01/20/2023   HGBA1C 5.8 01/20/2023    DG Chest 1 View  Result Date: 07/06/2023 CLINICAL DATA:  [id number].  Pneumonia follow-up. EXAM: CHEST  1 VIEW COMPARISON:  PA and lateral 07/04/2023. FINDINGS: 6:47 a.m. Cardiomegaly. Mild increased central vascular prominence without overt edema. There is increased patchy hazy airspace disease left mid to lower lung field, probably in the lower lobe, and scattered opacities newly noted in the right lower lung field most likely new areas of pneumonia.  There is no substantial pleural effusion. The remaining lungs are clear. Stable mediastinum with mild aortic tortuosity and atherosclerosis. No new osseous abnormality. IMPRESSION: 1. Increased patchy hazy airspace disease left mid to lower lung field, probably in the lower lobe, and scattered opacities newly noted in the right lower lung field most likely new areas of pneumonia. 2. Cardiomegaly with mild increased central vascular prominence without overt edema.  Electronically Signed   By: Almira Bar M.D.   On: 07/06/2023 07:02       Assessment & Plan:  Pneumonia due to infectious organism, unspecified laterality, unspecified part of lung  Hyperglycemia Assessment & Plan: Low carb diet and exercise.  Follow sugar.    Hypercholesterolemia Assessment & Plan: Continue crestor.  Continue diet and exercise.  Follow lipid panel and liver function tests.  Lab Results  Component Value Date   CHOL 116 06/01/2023   HDL 32.70 (L) 06/01/2023   LDLCALC 49 06/01/2023   LDLDIRECT 99.1 07/29/2013   TRIG 176.0 (H) 06/01/2023   CHOLHDL 4 06/01/2023      Essential hypertension, benign Assessment & Plan: Blood pressure as outlined.  On metoprolol.   Follow pressures.  Follow metabolic panel.   Orders: -     Basic metabolic panel  Thrombocytosis -     CBC with Differential/Platelet  Aortic atherosclerosis (HCC) Assessment & Plan: Continue crestor.    Atrial fibrillation, unspecified type [facility]) Assessment & Plan: Dr Mariah Milling 10/2022 - New onset atrial fibrillation. stop aspirin, start Eliquis 5 twice daily. Start metoprolol succinate 25 daily. Plan ECHO. F/u 01/16/23 - recent stress testing was of intermediate risk with a small area of apical-mid inferolateral ischemia. Echo shows normal LV function with mildly to moderately dilated left atrium. Elected to hold on cardioversion.  Wants to continue current medications - eliquis and metoprolol.  Had f/u with Dr Juliann Pares.  Stable.  No changes.     Community acquired pneumonia of left lower lobe of lung Assessment & Plan: Recent hospitalization as outlined.  Treated with IV abx and completed oral abx last week.  Breathing better.  Off oxygen.  Cough is better. Will need f/u cxr.    Orders: -     DG Chest 2 View; Future  Coronary artery disease involving native coronary artery of native heart with angina pectoris [facility]) Assessment & Plan: Heart catheterization 01/26/14 - proximal LAD 50%, mid LAD  99%, small first diagonal - 90% stenosis, first obtuse marginal 70% and small non dominant RCA.  S/P stent placement mid LAD (99%-0%).   Dr Mariah Milling f/u 12/2022 - Stress test reviewed showing very small region of possible ischemia in the distal inferolateral wall.  Hold on cath - no symptoms. Now on crestor 40mg  q day - goal LDL < 70.  Currently stable.  Had f/u with cardiology recently - stable - no changes.    Gastroesophageal reflux disease, unspecified whether esophagitis present Assessment & Plan: Continue omeprazole.  No upper symptoms reported. Follow.    Seizure disorder [facility]) Assessment & Plan: Doing well on keppra.  Followed by neurology.    Sleep apnea, unspecified type Assessment & Plan: Has known sleep apnea.  CPAP.     Weakness Assessment & Plan: Weakness and fatigue from recent infection and hospitalization.  PT evaluated and recommended home health PT.  Will confirm with PT plan to evaluate and treat.    Other orders -     Mupirocin; Apply 1 Application topically 2 (two) times daily.  Dispense: 22 g; Refill:  0     Dale Marland, MD

## 2023-07-22 DIAGNOSIS — I10 Essential (primary) hypertension: Secondary | ICD-10-CM | POA: Diagnosis not present

## 2023-07-22 DIAGNOSIS — A419 Sepsis, unspecified organism: Secondary | ICD-10-CM | POA: Diagnosis not present

## 2023-07-22 DIAGNOSIS — M109 Gout, unspecified: Secondary | ICD-10-CM

## 2023-07-22 DIAGNOSIS — I480 Paroxysmal atrial fibrillation: Secondary | ICD-10-CM | POA: Diagnosis not present

## 2023-07-22 DIAGNOSIS — H409 Unspecified glaucoma: Secondary | ICD-10-CM | POA: Diagnosis not present

## 2023-07-22 DIAGNOSIS — U071 COVID-19: Secondary | ICD-10-CM | POA: Diagnosis not present

## 2023-07-22 DIAGNOSIS — M1990 Unspecified osteoarthritis, unspecified site: Secondary | ICD-10-CM | POA: Diagnosis not present

## 2023-07-22 DIAGNOSIS — J1282 Pneumonia due to coronavirus disease 2019: Secondary | ICD-10-CM | POA: Diagnosis not present

## 2023-07-22 DIAGNOSIS — M5125 Other intervertebral disc displacement, thoracolumbar region: Secondary | ICD-10-CM

## 2023-07-22 DIAGNOSIS — I2510 Atherosclerotic heart disease of native coronary artery without angina pectoris: Secondary | ICD-10-CM | POA: Diagnosis not present

## 2023-07-22 DIAGNOSIS — G40909 Epilepsy, unspecified, not intractable, without status epilepticus: Secondary | ICD-10-CM | POA: Diagnosis not present

## 2023-07-22 DIAGNOSIS — K5790 Diverticulosis of intestine, part unspecified, without perforation or abscess without bleeding: Secondary | ICD-10-CM

## 2023-07-23 DIAGNOSIS — J340 Abscess, furuncle and carbuncle of nose: Secondary | ICD-10-CM | POA: Diagnosis not present

## 2023-07-23 DIAGNOSIS — H90A22 Sensorineural hearing loss, unilateral, left ear, with restricted hearing on the contralateral side: Secondary | ICD-10-CM | POA: Diagnosis not present

## 2023-07-23 DIAGNOSIS — H1013 Acute atopic conjunctivitis, bilateral: Secondary | ICD-10-CM | POA: Diagnosis not present

## 2023-07-23 DIAGNOSIS — H43813 Vitreous degeneration, bilateral: Secondary | ICD-10-CM | POA: Diagnosis not present

## 2023-07-23 DIAGNOSIS — Z0100 Encounter for examination of eyes and vision without abnormal findings: Secondary | ICD-10-CM | POA: Diagnosis not present

## 2023-07-23 DIAGNOSIS — H40003 Preglaucoma, unspecified, bilateral: Secondary | ICD-10-CM | POA: Diagnosis not present

## 2023-07-23 DIAGNOSIS — Z961 Presence of intraocular lens: Secondary | ICD-10-CM | POA: Diagnosis not present

## 2023-07-23 DIAGNOSIS — H90A32 Mixed conductive and sensorineural hearing loss, unilateral, left ear with restricted hearing on the contralateral side: Secondary | ICD-10-CM | POA: Diagnosis not present

## 2023-07-23 DIAGNOSIS — H6123 Impacted cerumen, bilateral: Secondary | ICD-10-CM | POA: Diagnosis not present

## 2023-07-26 ENCOUNTER — Encounter: Payer: Self-pay | Admitting: Internal Medicine

## 2023-07-26 ENCOUNTER — Telehealth: Payer: Self-pay | Admitting: Internal Medicine

## 2023-07-26 NOTE — Assessment & Plan Note (Signed)
Heart catheterization 01/26/14 - proximal LAD 50%, mid LAD 99%, small first diagonal - 90% stenosis, first obtuse marginal 70% and small non dominant RCA.  S/P stent placement mid LAD (99%-0%).   Dr Mariah Milling f/u 12/2022 - Stress test reviewed showing very small region of possible ischemia in the distal inferolateral wall.  Hold on cath - no symptoms. Now on crestor 40mg  q day - goal LDL < 70.  Currently stable.  Had f/u with cardiology recently - stable - no changes.

## 2023-07-26 NOTE — Assessment & Plan Note (Signed)
Weakness and fatigue from recent infection and hospitalization.  PT evaluated and recommended home health PT.  Will confirm with PT plan to evaluate and treat.

## 2023-07-26 NOTE — Assessment & Plan Note (Signed)
Continue crestor.  Continue diet and exercise.  Follow lipid panel and liver function tests.  Lab Results  Component Value Date   CHOL 116 06/01/2023   HDL 32.70 (L) 06/01/2023   LDLCALC 49 06/01/2023   LDLDIRECT 99.1 07/29/2013   TRIG 176.0 (H) 06/01/2023   CHOLHDL 4 06/01/2023

## 2023-07-26 NOTE — Assessment & Plan Note (Signed)
Recent hospitalization as outlined.  Treated with IV abx and completed oral abx last week.  Breathing better.  Off oxygen.  Cough is better. Will need f/u cxr.

## 2023-07-26 NOTE — Assessment & Plan Note (Signed)
Continue omeprazole.  No upper symptoms reported. Follow.

## 2023-07-26 NOTE — Assessment & Plan Note (Signed)
Has known sleep apnea.  CPAP.

## 2023-07-26 NOTE — Assessment & Plan Note (Signed)
Blood pressure as outlined.  On metoprolol.  Follow pressures. Follow metabolic panel.

## 2023-07-26 NOTE — Telephone Encounter (Signed)
Need to confirm home health PT arranged for him and he needs a f/u cxr in 4-6 weeks to confirm clearance of pneumonia.  I have placed the order for the cxr.

## 2023-07-26 NOTE — Assessment & Plan Note (Signed)
Continue crestor 

## 2023-07-26 NOTE — Assessment & Plan Note (Signed)
Dr Mariah Milling 10/2022 - New onset atrial fibrillation. stop aspirin, start Eliquis 5 twice daily. Start metoprolol succinate 25 daily. Plan ECHO. F/u 01/16/23 - recent stress testing was of intermediate risk with a small area of apical-mid inferolateral ischemia. Echo shows normal LV function with mildly to moderately dilated left atrium. Elected to hold on cardioversion.  Wants to continue current medications - eliquis and metoprolol.  Had f/u with Dr Juliann Pares.  Stable.  No changes.

## 2023-07-26 NOTE — Assessment & Plan Note (Signed)
Doing well on keppra.  Followed by neurology.

## 2023-07-26 NOTE — Assessment & Plan Note (Signed)
Low carb diet and exercise.  Follow sugar.  ?

## 2023-07-29 NOTE — Telephone Encounter (Signed)
CXR SCHEDULED. HOME HEALTH ORDERS FAXED.

## 2023-08-07 DIAGNOSIS — H903 Sensorineural hearing loss, bilateral: Secondary | ICD-10-CM | POA: Diagnosis not present

## 2023-08-07 DIAGNOSIS — J340 Abscess, furuncle and carbuncle of nose: Secondary | ICD-10-CM | POA: Diagnosis not present

## 2023-08-11 DIAGNOSIS — Z0100 Encounter for examination of eyes and vision without abnormal findings: Secondary | ICD-10-CM | POA: Diagnosis not present

## 2023-09-10 ENCOUNTER — Ambulatory Visit (INDEPENDENT_AMBULATORY_CARE_PROVIDER_SITE_OTHER): Payer: Medicare HMO

## 2023-09-10 ENCOUNTER — Other Ambulatory Visit: Payer: Medicare HMO

## 2023-09-10 DIAGNOSIS — J439 Emphysema, unspecified: Secondary | ICD-10-CM | POA: Diagnosis not present

## 2023-09-10 DIAGNOSIS — J189 Pneumonia, unspecified organism: Secondary | ICD-10-CM | POA: Diagnosis not present

## 2023-09-15 DIAGNOSIS — Z859 Personal history of malignant neoplasm, unspecified: Secondary | ICD-10-CM | POA: Diagnosis not present

## 2023-09-15 DIAGNOSIS — L578 Other skin changes due to chronic exposure to nonionizing radiation: Secondary | ICD-10-CM | POA: Diagnosis not present

## 2023-09-15 DIAGNOSIS — L570 Actinic keratosis: Secondary | ICD-10-CM | POA: Diagnosis not present

## 2023-09-15 DIAGNOSIS — L218 Other seborrheic dermatitis: Secondary | ICD-10-CM | POA: Diagnosis not present

## 2023-09-15 DIAGNOSIS — Z872 Personal history of diseases of the skin and subcutaneous tissue: Secondary | ICD-10-CM | POA: Diagnosis not present

## 2023-10-05 ENCOUNTER — Other Ambulatory Visit (INDEPENDENT_AMBULATORY_CARE_PROVIDER_SITE_OTHER): Payer: Medicare HMO

## 2023-10-05 DIAGNOSIS — E7800 Pure hypercholesterolemia, unspecified: Secondary | ICD-10-CM | POA: Diagnosis not present

## 2023-10-05 DIAGNOSIS — R739 Hyperglycemia, unspecified: Secondary | ICD-10-CM | POA: Diagnosis not present

## 2023-10-05 LAB — LIPID PANEL
Cholesterol: 119 mg/dL (ref 0–200)
HDL: 30.4 mg/dL — ABNORMAL LOW
LDL Cholesterol: 50 mg/dL (ref 0–99)
NonHDL: 88.4
Total CHOL/HDL Ratio: 4
Triglycerides: 194 mg/dL — ABNORMAL HIGH (ref 0.0–149.0)
VLDL: 38.8 mg/dL (ref 0.0–40.0)

## 2023-10-05 LAB — BASIC METABOLIC PANEL
BUN: 15 mg/dL (ref 6–23)
CO2: 27 meq/L (ref 19–32)
Calcium: 9.2 mg/dL (ref 8.4–10.5)
Chloride: 103 meq/L (ref 96–112)
Creatinine, Ser: 1.14 mg/dL (ref 0.40–1.50)
GFR: 59.98 mL/min — ABNORMAL LOW (ref >60.00)
Glucose, Bld: 98 mg/dL (ref 70–99)
Potassium: 4.1 meq/L (ref 3.5–5.1)
Sodium: 139 meq/L (ref 135–145)

## 2023-10-05 LAB — HEMOGLOBIN A1C: Hgb A1c MFr Bld: 5.7 % (ref 4.6–6.5)

## 2023-10-05 LAB — HEPATIC FUNCTION PANEL
ALT: 20 U/L (ref 0–53)
AST: 27 U/L (ref 0–37)
Albumin: 4 g/dL (ref 3.5–5.2)
Alkaline Phosphatase: 61 U/L (ref 39–117)
Bilirubin, Direct: 0.2 mg/dL (ref 0.0–0.3)
Total Bilirubin: 1.1 mg/dL (ref 0.2–1.2)
Total Protein: 7.1 g/dL (ref 6.0–8.3)

## 2023-10-07 ENCOUNTER — Ambulatory Visit: Payer: Medicare HMO | Admitting: Internal Medicine

## 2023-10-07 ENCOUNTER — Encounter: Payer: Self-pay | Admitting: Internal Medicine

## 2023-10-07 VITALS — BP 120/68 | HR 70 | Temp 98.2°F | Resp 16 | Ht 68.0 in | Wt 174.4 lb

## 2023-10-07 DIAGNOSIS — R739 Hyperglycemia, unspecified: Secondary | ICD-10-CM

## 2023-10-07 DIAGNOSIS — I25119 Atherosclerotic heart disease of native coronary artery with unspecified angina pectoris: Secondary | ICD-10-CM

## 2023-10-07 DIAGNOSIS — Z9109 Other allergy status, other than to drugs and biological substances: Secondary | ICD-10-CM | POA: Diagnosis not present

## 2023-10-07 DIAGNOSIS — I4891 Unspecified atrial fibrillation: Secondary | ICD-10-CM

## 2023-10-07 DIAGNOSIS — K219 Gastro-esophageal reflux disease without esophagitis: Secondary | ICD-10-CM | POA: Diagnosis not present

## 2023-10-07 DIAGNOSIS — G4730 Sleep apnea, unspecified: Secondary | ICD-10-CM

## 2023-10-07 DIAGNOSIS — G40909 Epilepsy, unspecified, not intractable, without status epilepticus: Secondary | ICD-10-CM

## 2023-10-07 DIAGNOSIS — R413 Other amnesia: Secondary | ICD-10-CM

## 2023-10-07 DIAGNOSIS — Z125 Encounter for screening for malignant neoplasm of prostate: Secondary | ICD-10-CM

## 2023-10-07 DIAGNOSIS — I10 Essential (primary) hypertension: Secondary | ICD-10-CM | POA: Diagnosis not present

## 2023-10-07 DIAGNOSIS — E7800 Pure hypercholesterolemia, unspecified: Secondary | ICD-10-CM | POA: Diagnosis not present

## 2023-10-07 DIAGNOSIS — I700 Atherosclerosis of aorta: Secondary | ICD-10-CM | POA: Diagnosis not present

## 2023-10-07 MED ORDER — LEVOCETIRIZINE DIHYDROCHLORIDE 5 MG PO TABS: 5.0000 mg | ORAL_TABLET | Freq: Every evening | ORAL | 3 refills | Status: DC

## 2023-10-07 MED ORDER — OMEPRAZOLE 20 MG PO CPDR
20.0000 mg | DELAYED_RELEASE_CAPSULE | Freq: Two times a day (BID) | ORAL | 3 refills | Status: AC
Start: 2023-10-07 — End: ?

## 2023-10-07 NOTE — Assessment & Plan Note (Signed)
Continue xyzal.  astelin and flonase. Stable. Has seen ENT.  

## 2023-10-07 NOTE — Assessment & Plan Note (Signed)
 Heart catheterization 01/26/14 - proximal LAD 50%, mid LAD 99%, small first diagonal - 90% stenosis, first obtuse marginal 70% and small non dominant RCA.  S/P stent placement mid LAD (99%-0%).   Dr Mariah Milling f/u 12/2022 - Stress test reviewed showing very small region of possible ischemia in the distal inferolateral wall.  Hold on cath - no symptoms. Now on crestor 40mg  q day - goal LDL < 70.  Currently stable.  Had f/u with cardiology recently - stable - no changes.

## 2023-10-07 NOTE — Assessment & Plan Note (Signed)
 Continue omeprazole.  No upper symptoms reported. Follow.

## 2023-10-07 NOTE — Assessment & Plan Note (Signed)
 Has known sleep apnea.  CPAP.

## 2023-10-07 NOTE — Assessment & Plan Note (Signed)
 Blood pressure as outlined.  On metoprolol.  Follow pressures. Follow metabolic panel.

## 2023-10-07 NOTE — Assessment & Plan Note (Signed)
 Doing well on keppra.  Followed by neurology.

## 2023-10-07 NOTE — Assessment & Plan Note (Signed)
 Low carb diet and exercise.  Follow sugar. Discussed low carb diet today.

## 2023-10-07 NOTE — Assessment & Plan Note (Signed)
 Continue crestor .  Continue diet and exercise.  Follow lipid panel and liver function tests.  Lab Results  Component Value Date   CHOL 119 10/05/2023   HDL 30.40 (L) 10/05/2023   LDLCALC 50 10/05/2023   LDLDIRECT 99.1 07/29/2013   TRIG 194.0 (H) 10/05/2023   CHOLHDL 4 10/05/2023

## 2023-10-07 NOTE — Assessment & Plan Note (Signed)
 Continue crestor

## 2023-10-07 NOTE — Assessment & Plan Note (Signed)
 Atrial fibrillation. Recent stress testing was of intermediate risk with a small area of apical-mid inferolateral ischemia. Echo shows normal LV function with mildly to moderately dilated left atrium. Elected to hold on cardioversion.  Wants to continue current medications - eliquis  and metoprolol .  Had f/u with Dr Florencio.  Stable.  No changes.

## 2023-10-07 NOTE — Progress Notes (Signed)
 Subjective:    Patient ID: Edwin [name], male    DOB: May 19, 1941, 83 y.o.   MRN: [id number]  Patient here for  Chief Complaint  Patient presents with   Medical Management of Chronic Issues    HPI Here for a scheduled follow up - follow up regarding hypercholesterolemia, hypertension and afib. Continues on eliquis  and metoprolol . No chest pain or sob reported. No increased cough. Has noticed an occasional twinge - right upper quadrant. Intermittent - does not occur frequently. No pain. No nausea or vomiting. No acid reflux. Bowels moving. He also has noticed - since had gallbladder removed and was sick in hospital - has noticed some change with in remembering directions. No other memory change. Discussed further evaluation and formal neurology evaluation.  Wants to monitor.    Past Medical History:  Diagnosis Date   Allergy    Arthritis    Coronary artery disease    a. 12/2013 PCI: LM nl, LAD 50p, 20m (3.0x28 Alpine Xience DES), D1 90, LCX nl, OM1 70, RCA nl; b. 12/2022 MV: apical-mid inflat ischemia, mildly reduced EF.   Diverticulosis of colon    GERD (gastroesophageal reflux disease)    ESOPHAGEAL REFLUX   Glaucoma    Gout    Hemorrhoids    History of kidney stones    Hx of colonic polyp    Hypercholesterolemia    Hypertension    LVH (left ventricular hypertrophy)    a. 12/2022 Echo: EF 60-65%, no rwma, mod conc LVH. Nl RV fxn. RVSP . Mild to mod LAE. Mild MR/AI.   Schatzki's ring    Seizures (HCC)    Past Surgical History:  Procedure Laterality Date   CARDIAC CATHETERIZATION     COLONOSCOPY WITH PROPOFOL  N/A 05/18/2017   Procedure: COLONOSCOPY WITH PROPOFOL ;  Surgeon: Viktoria Lamar DASEN, MD;  Location: [facility] END[facility]COPY;  Service: Endoscopy;  Laterality: N/A;   CORONARY ANGIOPLASTY     EYE SURGERY  07/20/13   cataract   HERNIA REPAIR Right 2008 & 2012   VASECTOMY  1984   Family History  Problem Relation Age of Onset   Arthritis Mother    Heart disease  Mother    Stroke Mother    Hypertension Mother    Arthritis Father    Hyperlipidemia Father    Heart disease Father    Hypertension Father    Parkinson's disease Father    Multiple sclerosis Sister    Colon cancer Maternal Grandmother    Arthritis Paternal Grandmother    Colon cancer Paternal Grandmother    Social History   Socioeconomic History   Marital status: Married    Spouse name: Not on file   Number of children: Not on file   Years of education: Not on file   Highest education level: Not on file  Occupational History   Not on file  Tobacco Use   Smoking status: Never   Smokeless tobacco: Never  Vaping Use   Vaping status: Never Used  Substance and Sexual Activity   Alcohol use: No    Alcohol/week: 0.0 standard drinks of alcohol   Drug use: No   Sexual activity: Never  Other Topics Concern   Not on file  Social History Narrative   Not on file   Social Drivers of Health   Financial Resource Strain: Low Risk  (03/04/2023)   Overall Financial Resource Strain (CARDIA)    Difficulty of Paying Living Expenses: Not hard at all  Food Insecurity: No Food Insecurity (  07/13/2023)   Hunger Vital Sign    Worried About Running Out of Food in the Last Year: Never true    Ran Out of Food in the Last Year: Never true  Transportation Needs: No Transportation Needs (07/13/2023)   PRAPARE - Administrator, Civil Service (Medical): No    Lack of Transportation (Non-Medical): No  Physical Activity: Sufficiently Active (03/04/2023)   Exercise Vital Sign    Days of Exercise per Week: 5 days    Minutes of Exercise per Session: 30 min  Stress: No Stress Concern Present (03/04/2023)   [employer] of Occupational Health - Occupational Stress Questionnaire    Feeling of Stress : Not at all  Social Connections: Moderately Integrated (03/04/2023)   Social Connection and Isolation Panel [NHANES]    Frequency of Communication with Friends and Family: More than three times a  week    Frequency of Social Gatherings with Friends and Family: More than three times a week    Attends Religious Services: 1 to 4 times per year    Active Member of [employer] or Organizations: No    Attends Banker Meetings: Never    Marital Status: Married     Review of Systems  Constitutional:  Negative for appetite change and unexpected weight change.  HENT:  Negative for congestion and sinus pressure.   Respiratory:  Negative for cough, chest tightness and shortness of breath.   Cardiovascular:  Negative for chest pain, palpitations and leg swelling.  Gastrointestinal:  Negative for abdominal pain, diarrhea, nausea and vomiting.  Genitourinary:  Negative for difficulty urinating and dysuria.  Musculoskeletal:  Negative for joint swelling and myalgias.  Skin:  Negative for color change and rash.  Neurological:  Negative for dizziness and headaches.  Psychiatric/Behavioral:  Negative for agitation and dysphoric mood.        Objective:     BP 120/68   Pulse 70   Temp 98.2 F (36.8 C)   Resp 16   Ht 5' 8 (1.727 m)   Wt 174 lb 6.4 oz (79.1 kg)   SpO2 98%   BMI 26.52 kg/m  Wt Readings from Last 3 Encounters:  10/07/23 174 lb 6.4 oz (79.1 kg)  07/21/23 168 lb (76.2 kg)  07/04/23 170 lb (77.1 kg)    Physical Exam Constitutional:      General: He is not in acute distress.    Appearance: Normal appearance. He is well-developed.  HENT:     Head: Normocephalic and atraumatic.     Right Ear: External ear normal.     Left Ear: External ear normal.     Mouth/Throat:     Pharynx: No oropharyngeal exudate or posterior oropharyngeal erythema.  Eyes:     General: No scleral icterus.       Right eye: No discharge.        Left eye: No discharge.  Cardiovascular:     Rate and Rhythm: Normal rate and regular rhythm.  Pulmonary:     Effort: Pulmonary effort is normal. No respiratory distress.     Breath sounds: Normal breath sounds.  Abdominal:     General: Bowel  sounds are normal.     Palpations: Abdomen is soft.     Tenderness: There is no abdominal tenderness.  Musculoskeletal:        General: No swelling or tenderness.     Cervical back: Neck supple. No tenderness.  Lymphadenopathy:     Cervical: No cervical adenopathy.  Skin:  Findings: No erythema or rash.  Neurological:     Mental Status: He is alert.  Psychiatric:        Mood and Affect: Mood normal.        Behavior: Behavior normal.         Outpatient Encounter Medications as of 10/07/2023  Medication Sig   apixaban  (ELIQUIS ) 5 MG TABS tablet Take 1 tablet (5 mg total) by mouth 2 (two) times daily.   azelastine  (ASTELIN ) 0.1 % nasal spray Place 1 spray into both nostrils 2 (two) times daily.   Cholecalciferol  (VITAMIN D3) 2000 UNITS TABS Take 2,000 Units by mouth daily.    fluticasone  (FLONASE ) 50 MCG/ACT nasal spray USE 2 SPRAYS IN EACH NOSTRIL EVERY DAY   ketoconazole  (NIZORAL ) 2 % shampoo Apply topically. 2-3 times weekly   latanoprost  (XALATAN ) 0.005 % ophthalmic solution Place 1 drop into both eyes at bedtime.   levETIRAcetam  (KEPPRA  XR) 500 MG 24 hr tablet Take 1,000 mg by mouth daily.   levocetirizine (XYZAL ) 5 MG tablet Take 1 tablet (5 mg total) by mouth every evening.   MAGNESIUM GLYCINATE PO Take 240 mg by mouth daily.   metoprolol  succinate (TOPROL  XL) 25 MG 24 hr tablet Take 1 tablet (25 mg total) by mouth daily.   Multiple Vitamin (MULTIVITAMIN) tablet Take 1 tablet by mouth daily.   mupirocin  ointment (BACTROBAN ) 2 % Apply 1 Application topically 2 (two) times daily.   omeprazole  (PRILOSEC) 20 MG capsule Take 1 capsule (20 mg total) by mouth 2 (two) times daily.   polyethylene glycol (MIRALAX  / GLYCOLAX ) 17 g packet Take 17 g by mouth daily.   Psyllium 100 % POWD 1 TSP Daily   rosuvastatin  (CRESTOR ) 40 MG tablet Take 1 tablet (40 mg total) by mouth daily.   Saw Palmetto 450 MG CAPS Take 450 mg by mouth daily.    Zinc 50 MG TABS Take 50 mg by mouth daily.    [DISCONTINUED] levocetirizine (XYZAL ) 5 MG tablet TAKE 1 TABLET EVERY EVENING   [DISCONTINUED] omeprazole  (PRILOSEC) 20 MG capsule TAKE 1 CAPSULE TWICE DAILY BEFORE MEALS (Patient taking differently: 40 mg in the morning.)   No facility-administered encounter medications on file as of 10/07/2023.     Lab Results  Component Value Date   WBC 7.6 07/21/2023   HGB 13.8 07/21/2023   HCT 41.6 07/21/2023   PLT 350.0 07/21/2023   GLUCOSE 98 10/05/2023   CHOL 119 10/05/2023   TRIG 194.0 (H) 10/05/2023   HDL 30.40 (L) 10/05/2023   LDLDIRECT 99.1 07/29/2013   LDLCALC 50 10/05/2023   ALT 20 10/05/2023   AST 27 10/05/2023   NA 139 10/05/2023   K 4.1 10/05/2023   CL 103 10/05/2023   CREATININE 1.14 10/05/2023   BUN 15 10/05/2023   CO2 27 10/05/2023   TSH 3.42 01/20/2023   PSA 1.04 01/20/2023   HGBA1C 5.7 10/05/2023    DG Chest 1 View Result Date: 07/06/2023 CLINICAL DATA:  [id number].  Pneumonia follow-up. EXAM: CHEST  1 VIEW COMPARISON:  PA and lateral 07/04/2023. FINDINGS: 6:47 a.m. Cardiomegaly. Mild increased central vascular prominence without overt edema. There is increased patchy hazy airspace disease left mid to lower lung field, probably in the lower lobe, and scattered opacities newly noted in the right lower lung field most likely new areas of pneumonia. There is no substantial pleural effusion. The remaining lungs are clear. Stable mediastinum with mild aortic tortuosity and atherosclerosis. No new osseous abnormality. IMPRESSION: 1. Increased patchy hazy airspace  disease left mid to lower lung field, probably in the lower lobe, and scattered opacities newly noted in the right lower lung field most likely new areas of pneumonia. 2. Cardiomegaly with mild increased central vascular prominence without overt edema. Electronically Signed   By: Francis Quam M.D.   On: 07/06/2023 07:02       Assessment & Plan:  Hyperglycemia Assessment & Plan: Low carb diet and exercise.  Follow sugar.  Discussed low carb diet today.   Orders: -     Hemoglobin A1c; Future  Hypercholesterolemia Assessment & Plan: Continue crestor .  Continue diet and exercise.  Follow lipid panel and liver function tests.  Lab Results  Component Value Date   CHOL 119 10/05/2023   HDL 30.40 (L) 10/05/2023   LDLCALC 50 10/05/2023   LDLDIRECT 99.1 07/29/2013   TRIG 194.0 (H) 10/05/2023   CHOLHDL 4 10/05/2023     Orders: -     Lipid panel; Future -     Hepatic function panel; Future  Prostate cancer screening -     PSA, Medicare; Future  Essential hypertension, benign Assessment & Plan: Blood pressure as outlined.  On metoprolol .   Follow pressures.  Follow metabolic panel.   Orders: -     Basic metabolic panel; Future -     TSH; Future  Aortic atherosclerosis (HCC) Assessment & Plan: Continue crestor .    Atrial fibrillation, unspecified type [facility]) Assessment & Plan: Atrial fibrillation. Recent stress testing was of intermediate risk with a small area of apical-mid inferolateral ischemia. Echo shows normal LV function with mildly to moderately dilated left atrium. Elected to hold on cardioversion.  Wants to continue current medications - eliquis  and metoprolol .  Had f/u with Dr Florencio.  Stable.  No changes.     Coronary artery disease involving native coronary artery of native heart with angina pectoris [facility]) Assessment & Plan: Heart catheterization 01/26/14 - proximal LAD 50%, mid LAD 99%, small first diagonal - 90% stenosis, first obtuse marginal 70% and small non dominant RCA.  S/P stent placement mid LAD (99%-0%).   Dr Gollan f/u 12/2022 - Stress test reviewed showing very small region of possible ischemia in the distal inferolateral wall.  Hold on cath - no symptoms. Now on crestor  40mg  q day - goal LDL < 70.  Currently stable.  Had f/u with cardiology recently - stable - no changes.    Environmental allergies Assessment & Plan: Continue xyzal .  astelin  and flonase . Stable. Has seen  ENT.    Gastroesophageal reflux disease, unspecified whether esophagitis present Assessment & Plan: Continue omeprazole .  No upper symptoms reported. Follow.    Seizure disorder [facility]) Assessment & Plan: Doing well on keppra .  Followed by neurology.    Sleep apnea, unspecified type Assessment & Plan: Has known sleep apnea.  CPAP.     Memory change Assessment & Plan: Some change (with directions) as outlined. Discussed further w/up. Wants to monitor. Will check B12 with next labs. Follow. Notify me if changes his mind.   Orders: -     Vitamin B12; Future  Other orders -     Levocetirizine Dihydrochloride ; Take 1 tablet (5 mg total) by mouth every evening.  Dispense: 90 tablet; Refill: 3 -     Omeprazole ; Take 1 capsule (20 mg total) by mouth 2 (two) times daily.  Dispense: 180 capsule; Refill: 3     Allena Hamilton, MD

## 2023-10-07 NOTE — Assessment & Plan Note (Signed)
 Some change (with directions) as outlined. Discussed further w/up. Wants to monitor. Will check B12 with next labs. Follow. Notify me if changes his mind.

## 2023-11-19 DIAGNOSIS — R21 Rash and other nonspecific skin eruption: Secondary | ICD-10-CM | POA: Diagnosis not present

## 2024-01-28 DIAGNOSIS — Z961 Presence of intraocular lens: Secondary | ICD-10-CM | POA: Diagnosis not present

## 2024-01-28 DIAGNOSIS — H43813 Vitreous degeneration, bilateral: Secondary | ICD-10-CM | POA: Diagnosis not present

## 2024-01-28 DIAGNOSIS — H40003 Preglaucoma, unspecified, bilateral: Secondary | ICD-10-CM | POA: Diagnosis not present

## 2024-02-04 ENCOUNTER — Other Ambulatory Visit (INDEPENDENT_AMBULATORY_CARE_PROVIDER_SITE_OTHER): Payer: Medicare HMO

## 2024-02-04 DIAGNOSIS — R739 Hyperglycemia, unspecified: Secondary | ICD-10-CM

## 2024-02-04 DIAGNOSIS — Z125 Encounter for screening for malignant neoplasm of prostate: Secondary | ICD-10-CM | POA: Diagnosis not present

## 2024-02-04 DIAGNOSIS — E7800 Pure hypercholesterolemia, unspecified: Secondary | ICD-10-CM

## 2024-02-04 DIAGNOSIS — I10 Essential (primary) hypertension: Secondary | ICD-10-CM

## 2024-02-04 DIAGNOSIS — R413 Other amnesia: Secondary | ICD-10-CM | POA: Diagnosis not present

## 2024-02-04 LAB — VITAMIN B12: Vitamin B-12: 719 pg/mL (ref 211–911)

## 2024-02-04 LAB — LIPID PANEL
Cholesterol: 121 mg/dL (ref 0–200)
HDL: 32.2 mg/dL — ABNORMAL LOW (ref >39.00)
LDL Cholesterol: 59 mg/dL (ref 0–99)
NonHDL: 88.76
Total CHOL/HDL Ratio: 4
Triglycerides: 150 mg/dL — ABNORMAL HIGH (ref 0.0–149.0)
VLDL: 30 mg/dL (ref 0.0–40.0)

## 2024-02-04 LAB — HEPATIC FUNCTION PANEL
ALT: 23 U/L (ref 0–53)
AST: 27 U/L (ref 0–37)
Albumin: 4.1 g/dL (ref 3.5–5.2)
Alkaline Phosphatase: 64 U/L (ref 39–117)
Bilirubin, Direct: 0.2 mg/dL (ref 0.0–0.3)
Total Bilirubin: 0.9 mg/dL (ref 0.2–1.2)
Total Protein: 6.9 g/dL (ref 6.0–8.3)

## 2024-02-04 LAB — BASIC METABOLIC PANEL WITH GFR
BUN: 17 mg/dL (ref 6–23)
CO2: 21 meq/L (ref 19–32)
Calcium: 9.2 mg/dL (ref 8.4–10.5)
Chloride: 106 meq/L (ref 96–112)
Creatinine, Ser: 1.21 mg/dL (ref 0.40–1.50)
GFR: 55.71 mL/min — ABNORMAL LOW (ref >60.00)
Glucose, Bld: 102 mg/dL — ABNORMAL HIGH (ref 70–99)
Potassium: 4.1 meq/L (ref 3.5–5.1)
Sodium: 139 meq/L (ref 135–145)

## 2024-02-04 LAB — HEMOGLOBIN A1C: Hgb A1c MFr Bld: 5.9 % (ref 4.6–6.5)

## 2024-02-04 LAB — TSH: TSH: 2.74 u[IU]/mL (ref 0.35–5.50)

## 2024-02-04 LAB — PSA, MEDICARE: PSA: 1.23 ng/mL (ref 0.10–4.00)

## 2024-02-05 ENCOUNTER — Ambulatory Visit: Payer: Self-pay | Admitting: Internal Medicine

## 2024-02-05 DIAGNOSIS — R29818 Other symptoms and signs involving the nervous system: Secondary | ICD-10-CM | POA: Diagnosis not present

## 2024-02-05 DIAGNOSIS — I4891 Unspecified atrial fibrillation: Secondary | ICD-10-CM | POA: Diagnosis not present

## 2024-02-05 DIAGNOSIS — G4733 Obstructive sleep apnea (adult) (pediatric): Secondary | ICD-10-CM | POA: Diagnosis not present

## 2024-02-05 DIAGNOSIS — E7800 Pure hypercholesterolemia, unspecified: Secondary | ICD-10-CM | POA: Diagnosis not present

## 2024-02-05 DIAGNOSIS — R569 Unspecified convulsions: Secondary | ICD-10-CM | POA: Diagnosis not present

## 2024-02-05 DIAGNOSIS — I700 Atherosclerosis of aorta: Secondary | ICD-10-CM | POA: Diagnosis not present

## 2024-02-05 DIAGNOSIS — I25119 Atherosclerotic heart disease of native coronary artery with unspecified angina pectoris: Secondary | ICD-10-CM | POA: Diagnosis not present

## 2024-02-05 DIAGNOSIS — I2510 Atherosclerotic heart disease of native coronary artery without angina pectoris: Secondary | ICD-10-CM | POA: Diagnosis not present

## 2024-02-05 DIAGNOSIS — I10 Essential (primary) hypertension: Secondary | ICD-10-CM | POA: Diagnosis not present

## 2024-02-08 ENCOUNTER — Ambulatory Visit (INDEPENDENT_AMBULATORY_CARE_PROVIDER_SITE_OTHER): Payer: Medicare HMO | Admitting: Internal Medicine

## 2024-02-08 ENCOUNTER — Encounter: Payer: Self-pay | Admitting: Internal Medicine

## 2024-02-08 VITALS — BP 118/72 | HR 67 | Temp 98.0°F | Resp 16 | Ht 68.0 in | Wt 174.6 lb

## 2024-02-08 DIAGNOSIS — R200 Anesthesia of skin: Secondary | ICD-10-CM

## 2024-02-08 DIAGNOSIS — F419 Anxiety disorder, unspecified: Secondary | ICD-10-CM | POA: Diagnosis not present

## 2024-02-08 DIAGNOSIS — R944 Abnormal results of kidney function studies: Secondary | ICD-10-CM

## 2024-02-08 DIAGNOSIS — I4891 Unspecified atrial fibrillation: Secondary | ICD-10-CM

## 2024-02-08 DIAGNOSIS — G40909 Epilepsy, unspecified, not intractable, without status epilepticus: Secondary | ICD-10-CM

## 2024-02-08 DIAGNOSIS — I10 Essential (primary) hypertension: Secondary | ICD-10-CM

## 2024-02-08 DIAGNOSIS — I700 Atherosclerosis of aorta: Secondary | ICD-10-CM

## 2024-02-08 DIAGNOSIS — R739 Hyperglycemia, unspecified: Secondary | ICD-10-CM

## 2024-02-08 DIAGNOSIS — I25119 Atherosclerotic heart disease of native coronary artery with unspecified angina pectoris: Secondary | ICD-10-CM | POA: Diagnosis not present

## 2024-02-08 DIAGNOSIS — R868 Other abnormal findings in specimens from male genital organs: Secondary | ICD-10-CM

## 2024-02-08 DIAGNOSIS — K219 Gastro-esophageal reflux disease without esophagitis: Secondary | ICD-10-CM | POA: Diagnosis not present

## 2024-02-08 DIAGNOSIS — G4730 Sleep apnea, unspecified: Secondary | ICD-10-CM

## 2024-02-08 DIAGNOSIS — E7800 Pure hypercholesterolemia, unspecified: Secondary | ICD-10-CM

## 2024-02-08 NOTE — Progress Notes (Signed)
 Subjective:    Patient ID: Edwin [name], male    DOB: 04/06/1941, 83 y.o.   MRN: [id number]  Patient here for  Chief Complaint  Patient presents with   Medical Management of Chronic Issues    HPI Here for a scheduled follow up - follow up regarding hypercholesterolemia, hypertension and afib. Continues on eliquis  and metoprolol . Saw cardiology 02/05/24 - recommended to decrease metoprolol  to 12.5mg  q day. Also recommended to stop aspirin . He is using cpap. No chest pain or sob reported. Does have some issued with right CTS. Wearing a brace. Wants to hold on further intervention. Has noticed color change - semen. No blood. Noticed was yellow/green. Persistent color change. Urinating ok. No pain. No discharge. Discussed labs.    Past Medical History:  Diagnosis Date   Allergy    Arthritis    Coronary artery disease    a. 12/2013 PCI: LM nl, LAD 50p, 67m (3.0x28 Alpine Xience DES), D1 90, LCX nl, OM1 70, RCA nl; b. 12/2022 MV: apical-mid inflat ischemia, mildly reduced EF.   Diverticulosis of colon    GERD (gastroesophageal reflux disease)    ESOPHAGEAL REFLUX   Glaucoma    Gout    Hemorrhoids    History of kidney stones    Hx of colonic polyp    Hypercholesterolemia    Hypertension    LVH (left ventricular hypertrophy)    a. 12/2022 Echo: EF 60-65%, no rwma, mod conc LVH. Nl RV fxn. RVSP . Mild to mod LAE. Mild MR/AI.   Schatzki's ring    Seizures (HCC)    Past Surgical History:  Procedure Laterality Date   CARDIAC CATHETERIZATION     COLONOSCOPY WITH PROPOFOL  N/A 05/18/2017   Procedure: COLONOSCOPY WITH PROPOFOL ;  Surgeon: Cassie Click, MD;  Location: [facility] ENDOSCOPY;  Service: Endoscopy;  Laterality: N/A;   CORONARY ANGIOPLASTY     EYE SURGERY  07/20/13   cataract   HERNIA REPAIR Right 2008 & 2012   VASECTOMY  1984   Family History  Problem Relation Age of Onset   Arthritis Mother    Heart disease Mother    Stroke Mother    Hypertension Mother     Arthritis Father    Hyperlipidemia Father    Heart disease Father    Hypertension Father    Parkinson's disease Father    Multiple sclerosis Sister    Colon cancer Maternal Grandmother    Arthritis Paternal Grandmother    Colon cancer Paternal Grandmother    Social History   Socioeconomic History   Marital status: Married    Spouse name: Not on file   Number of children: Not on file   Years of education: Not on file   Highest education level: Not on file  Occupational History   Not on file  Tobacco Use   Smoking status: Never   Smokeless tobacco: Never  Vaping Use   Vaping status: Never Used  Substance and Sexual Activity   Alcohol use: No    Alcohol/week: 0.0 standard drinks of alcohol   Drug use: No   Sexual activity: Never  Other Topics Concern   Not on file  Social History Narrative   Not on file   Social Drivers of Health   Financial Resource Strain: Low Risk  (03/04/2023)   Overall Financial Resource Strain (CARDIA)    Difficulty of Paying Living Expenses: Not hard at all  Food Insecurity: No Food Insecurity (07/13/2023)   Hunger Vital Sign  Worried About Programme researcher, broadcasting/film/video in the Last Year: Never true    Ran Out of Food in the Last Year: Never true  Transportation Needs: No Transportation Needs (07/13/2023)   PRAPARE - Administrator, Civil Service (Medical): No    Lack of Transportation (Non-Medical): No  Physical Activity: Sufficiently Active (03/04/2023)   Exercise Vital Sign    Days of Exercise per Week: 5 days    Minutes of Exercise per Session: 30 min  Stress: No Stress Concern Present (03/04/2023)   [employer] of Occupational Health - Occupational Stress Questionnaire    Feeling of Stress : Not at all  Social Connections: Moderately Integrated (03/04/2023)   Social Connection and Isolation Panel    Frequency of Communication with Friends and Family: More than three times a week    Frequency of Social Gatherings with Friends and  Family: More than three times a week    Attends Religious Services: 1 to 4 times per year    Active Member of [employer] or Organizations: No    Attends Banker Meetings: Never    Marital Status: Married     Review of Systems  Constitutional:  Negative for appetite change and unexpected weight change.  HENT:  Negative for congestion and sinus pressure.   Respiratory:  Negative for cough, chest tightness and shortness of breath.   Cardiovascular:  Negative for chest pain, palpitations and leg swelling.  Gastrointestinal:  Negative for abdominal pain, diarrhea, nausea and vomiting.  Genitourinary:  Negative for difficulty urinating and dysuria.       Color change -sperm.   Musculoskeletal:  Negative for joint swelling and myalgias.  Skin:  Negative for color change and rash.  Neurological:  Negative for dizziness and headaches.  Psychiatric/Behavioral:  Negative for agitation and dysphoric mood.        Objective:     BP 118/72   Pulse 67   Temp 98 F (36.7 C)   Resp 16   Ht 5' 8 (1.727 m)   Wt 174 lb 9.6 oz (79.2 kg)   SpO2 98%   BMI 26.55 kg/m  Wt Readings from Last 3 Encounters:  02/08/24 174 lb 9.6 oz (79.2 kg)  10/07/23 174 lb 6.4 oz (79.1 kg)  07/21/23 168 lb (76.2 kg)    Physical Exam Vitals reviewed.  Constitutional:      General: He is not in acute distress.    Appearance: Normal appearance. He is well-developed.  HENT:     Head: Normocephalic and atraumatic.     Right Ear: External ear normal.     Left Ear: External ear normal.     Mouth/Throat:     Pharynx: No oropharyngeal exudate or posterior oropharyngeal erythema.   Eyes:     General: No scleral icterus.       Right eye: No discharge.        Left eye: No discharge.     Conjunctiva/sclera: Conjunctivae normal.    Cardiovascular:     Rate and Rhythm: Normal rate and regular rhythm.  Pulmonary:     Effort: Pulmonary effort is normal. No respiratory distress.     Breath sounds: Normal  breath sounds.  Abdominal:     General: Bowel sounds are normal.     Palpations: Abdomen is soft.     Tenderness: There is no abdominal tenderness.   Musculoskeletal:        General: No swelling or tenderness.     Cervical back:  Neck supple. No tenderness.  Lymphadenopathy:     Cervical: No cervical adenopathy.   Skin:    Findings: No erythema or rash.   Neurological:     Mental Status: He is alert.   Psychiatric:        Mood and Affect: Mood normal.        Behavior: Behavior normal.         Outpatient Encounter Medications as of 02/08/2024  Medication Sig   apixaban  (ELIQUIS ) 5 MG TABS tablet Take 1 tablet (5 mg total) by mouth 2 (two) times daily.   Cholecalciferol  (VITAMIN D3) 2000 UNITS TABS Take 2,000 Units by mouth daily.    ketoconazole  (NIZORAL ) 2 % shampoo Apply topically. 2-3 times weekly   latanoprost  (XALATAN ) 0.005 % ophthalmic solution Place 1 drop into both eyes at bedtime.   levETIRAcetam  (KEPPRA  XR) 500 MG 24 hr tablet Take 1,000 mg by mouth daily.   levocetirizine (XYZAL ) 5 MG tablet Take 1 tablet (5 mg total) by mouth every evening.   MAGNESIUM GLYCINATE PO Take 240 mg by mouth daily.   metoprolol  succinate (TOPROL  XL) 25 MG 24 hr tablet Take 1 tablet (25 mg total) by mouth daily.   Multiple Vitamin (MULTIVITAMIN) tablet Take 1 tablet by mouth daily.   omeprazole  (PRILOSEC) 20 MG capsule Take 1 capsule (20 mg total) by mouth 2 (two) times daily.   polyethylene glycol (MIRALAX  / GLYCOLAX ) 17 g packet Take 17 g by mouth daily.   Psyllium 100 % POWD 1 TSP Daily   rosuvastatin  (CRESTOR ) 40 MG tablet Take 1 tablet (40 mg total) by mouth daily.   Saw Palmetto 450 MG CAPS Take 450 mg by mouth daily.    Zinc 50 MG TABS Take 50 mg by mouth daily.   [DISCONTINUED] azelastine  (ASTELIN ) 0.1 % nasal spray Place 1 spray into both nostrils 2 (two) times daily.   [DISCONTINUED] fluticasone  (FLONASE ) 50 MCG/ACT nasal spray USE 2 SPRAYS IN EACH NOSTRIL EVERY DAY    [DISCONTINUED] mupirocin  ointment (BACTROBAN ) 2 % Apply 1 Application topically 2 (two) times daily.   No facility-administered encounter medications on file as of 02/08/2024.     Lab Results  Component Value Date   WBC 7.6 07/21/2023   HGB 13.8 07/21/2023   HCT 41.6 07/21/2023   PLT 350.0 07/21/2023   GLUCOSE 102 (H) 02/04/2024   CHOL 121 02/04/2024   TRIG 150.0 (H) 02/04/2024   HDL 32.20 (L) 02/04/2024   LDLDIRECT 99.1 07/29/2013   LDLCALC 59 02/04/2024   ALT 23 02/04/2024   AST 27 02/04/2024   NA 139 02/04/2024   K 4.1 02/04/2024   CL 106 02/04/2024   CREATININE 1.21 02/04/2024   BUN 17 02/04/2024   CO2 21 02/04/2024   TSH 2.74 02/04/2024   PSA 1.23 02/04/2024   HGBA1C 5.9 02/04/2024    DG Chest 1 View Result Date: 07/06/2023 CLINICAL DATA:  [id number].  Pneumonia follow-up. EXAM: CHEST  1 VIEW COMPARISON:  PA and lateral 07/04/2023. FINDINGS: 6:47 a.m. Cardiomegaly. Mild increased central vascular prominence without overt edema. There is increased patchy hazy airspace disease left mid to lower lung field, probably in the lower lobe, and scattered opacities newly noted in the right lower lung field most likely new areas of pneumonia. There is no substantial pleural effusion. The remaining lungs are clear. Stable mediastinum with mild aortic tortuosity and atherosclerosis. No new osseous abnormality. IMPRESSION: 1. Increased patchy hazy airspace disease left mid to lower lung field, probably in the  lower lobe, and scattered opacities newly noted in the right lower lung field most likely new areas of pneumonia. 2. Cardiomegaly with mild increased central vascular prominence without overt edema. Electronically Signed   By: Denman Fischer M.D.   On: 07/06/2023 07:02       Assessment & Plan:  Essential hypertension, benign Assessment & Plan: Blood pressure as outlined.  On metoprolol .   Cardiology recently decreased dose to 12.5mg  q day. Follow pressures. Follow metabolic panel.    Orders: -     Urinalysis, Routine w reflex microscopic  Discolored semen Assessment & Plan: No blood. Persistent green/yellow semen. No discharge. No pain. Request urology evaluation.   Orders: -     Ambulatory referral to Urology  Decreased GFR Assessment & Plan: Decreased gfr noted on recent lab check. Stay hydrated. Avoid antiinflammatory medication. Recheck met b in 2 weeks to confirm stable/improved.   Orders: -     Basic metabolic panel with GFR; Future  Anxiety Assessment & Plan: Overall appears to be doing well. Follow.    Aortic atherosclerosis (HCC) Assessment & Plan: Continue crestor .    Atrial fibrillation, unspecified type [facility]) Assessment & Plan: Atrial fibrillation. Recent stress testing was of intermediate risk with a small area of apical-mid inferolateral ischemia. Echo shows normal LV function with mildly to moderately dilated left atrium. Elected to hold on cardioversion. Saw cardiology 02/05/24 - recommended to decrease metoprolol  to 12.5mg  q day. Also recommended to stop aspirin . Overall stable.    Coronary artery disease involving native coronary artery of native heart with angina pectoris [facility]) Assessment & Plan: Heart catheterization 01/26/14 - proximal LAD 50%, mid LAD 99%, small first diagonal - 90% stenosis, first obtuse marginal 70% and small non dominant RCA.  S/P stent placement mid LAD (99%-0%).   Dr Gollan f/u 12/2022 - Stress test reviewed showing very small region of possible ischemia in the distal inferolateral wall.  Hold on cath - no symptoms. Now on crestor  40mg  q day - goal LDL < 70.  Currently stable.  Had f/u with cardiology recently - stable - no changes.  Saw cardiology 02/05/24 - recommended to decrease metoprolol  to 12.5mg  q day. Also recommended to stop aspirin .    Gastroesophageal reflux disease, unspecified whether esophagitis present Assessment & Plan: Continue omeprazole . No upper symptoms reported.    Hand  numbness Assessment & Plan: Issues with CTS - right.  Discussed further w/up and evaluation.  Using wrist splint. Wants to hold on any furhter intervention. Follow.    Hypercholesterolemia Assessment & Plan: Continue crestor .  Continue diet and exercise.  Follow lipid panel and liver function tests.  Lab Results  Component Value Date   CHOL 121 02/04/2024   HDL 32.20 (L) 02/04/2024   LDLCALC 59 02/04/2024   LDLDIRECT 99.1 07/29/2013   TRIG 150.0 (H) 02/04/2024   CHOLHDL 4 02/04/2024      Hyperglycemia Assessment & Plan: Low carb diet and exercise.  Follow met b and A1c.    Seizure disorder [facility]) Assessment & Plan: Doing well on keppra .  Followed by neurology.    Sleep apnea, unspecified type Assessment & Plan: Continue cpap.       Dellar Fenton, MD

## 2024-02-08 NOTE — Patient Instructions (Addendum)
 Dr Henderson Lock [phone]  Dr Elfrieda Grise [phone]

## 2024-02-09 DIAGNOSIS — Z1331 Encounter for screening for depression: Secondary | ICD-10-CM | POA: Diagnosis not present

## 2024-02-09 DIAGNOSIS — G4733 Obstructive sleep apnea (adult) (pediatric): Secondary | ICD-10-CM | POA: Diagnosis not present

## 2024-02-09 DIAGNOSIS — G3184 Mild cognitive impairment, so stated: Secondary | ICD-10-CM | POA: Diagnosis not present

## 2024-02-09 DIAGNOSIS — R569 Unspecified convulsions: Secondary | ICD-10-CM | POA: Diagnosis not present

## 2024-02-09 LAB — URINALYSIS, ROUTINE W REFLEX MICROSCOPIC
Bilirubin, UA: NEGATIVE
Glucose, UA: NEGATIVE
Ketones, UA: NEGATIVE
Leukocytes,UA: NEGATIVE
Nitrite, UA: NEGATIVE
RBC, UA: NEGATIVE
Specific Gravity, UA: 1.017 (ref 1.005–1.030)
Urobilinogen, Ur: 0.2 mg/dL (ref 0.2–1.0)
pH, UA: 7.5 (ref 5.0–7.5)

## 2024-02-10 ENCOUNTER — Ambulatory Visit: Payer: Self-pay | Admitting: Internal Medicine

## 2024-02-10 DIAGNOSIS — R944 Abnormal results of kidney function studies: Secondary | ICD-10-CM

## 2024-02-14 ENCOUNTER — Encounter: Payer: Self-pay | Admitting: Internal Medicine

## 2024-02-14 DIAGNOSIS — R944 Abnormal results of kidney function studies: Secondary | ICD-10-CM | POA: Insufficient documentation

## 2024-02-14 DIAGNOSIS — R868 Other abnormal findings in specimens from male genital organs: Secondary | ICD-10-CM | POA: Insufficient documentation

## 2024-02-14 NOTE — Assessment & Plan Note (Signed)
 Blood pressure as outlined.  On metoprolol .   Cardiology recently decreased dose to 12.5mg  q day. Follow pressures. Follow metabolic panel.

## 2024-02-14 NOTE — Assessment & Plan Note (Signed)
 Low-carb diet and exercise.  Follow met b and A1c.

## 2024-02-14 NOTE — Assessment & Plan Note (Signed)
 Continue crestor

## 2024-02-14 NOTE — Assessment & Plan Note (Signed)
 Heart catheterization 01/26/14 - proximal LAD 50%, mid LAD 99%, small first diagonal - 90% stenosis, first obtuse marginal 70% and small non dominant RCA.  S/P stent placement mid LAD (99%-0%).   Dr Gollan f/u 12/2022 - Stress test reviewed showing very small region of possible ischemia in the distal inferolateral wall.  Hold on cath - no symptoms. Now on crestor  40mg  q day - goal LDL < 70.  Currently stable.  Had f/u with cardiology recently - stable - no changes.  Saw cardiology 02/05/24 - recommended to decrease metoprolol  to 12.5mg  q day. Also recommended to stop aspirin .

## 2024-02-14 NOTE — Assessment & Plan Note (Signed)
 Decreased gfr noted on recent lab check. Stay hydrated. Avoid antiinflammatory medication. Recheck met b in 2 weeks to confirm stable/improved.

## 2024-02-14 NOTE — Assessment & Plan Note (Signed)
 Overall appears to be doing well.  Follow.

## 2024-02-14 NOTE — Assessment & Plan Note (Signed)
Continue omeprazole.  No upper symptoms reported.

## 2024-02-14 NOTE — Assessment & Plan Note (Signed)
 Continue cpap.

## 2024-02-14 NOTE — Assessment & Plan Note (Signed)
 Doing well on keppra.  Followed by neurology.

## 2024-02-14 NOTE — Assessment & Plan Note (Signed)
 Issues with CTS - right.  Discussed further w/up and evaluation.  Using wrist splint. Wants to hold on any furhter intervention. Follow.

## 2024-02-14 NOTE — Assessment & Plan Note (Signed)
 No blood. Persistent green/yellow semen. No discharge. No pain. Request urology evaluation.

## 2024-02-14 NOTE — Assessment & Plan Note (Signed)
 Atrial fibrillation. Recent stress testing was of intermediate risk with a small area of apical-mid inferolateral ischemia. Echo shows normal LV function with mildly to moderately dilated left atrium. Elected to hold on cardioversion. Saw cardiology 02/05/24 - recommended to decrease metoprolol  to 12.5mg  q day. Also recommended to stop aspirin . Overall stable.

## 2024-02-14 NOTE — Assessment & Plan Note (Signed)
 Continue crestor .  Continue diet and exercise.  Follow lipid panel and liver function tests.  Lab Results  Component Value Date   CHOL 121 02/04/2024   HDL 32.20 (L) 02/04/2024   LDLCALC 59 02/04/2024   LDLDIRECT 99.1 07/29/2013   TRIG 150.0 (H) 02/04/2024   CHOLHDL 4 02/04/2024

## 2024-02-22 ENCOUNTER — Other Ambulatory Visit (INDEPENDENT_AMBULATORY_CARE_PROVIDER_SITE_OTHER)

## 2024-02-22 DIAGNOSIS — R944 Abnormal results of kidney function studies: Secondary | ICD-10-CM | POA: Diagnosis not present

## 2024-02-22 LAB — BASIC METABOLIC PANEL WITH GFR
BUN: 15 mg/dL (ref 6–23)
CO2: 24 meq/L (ref 19–32)
Calcium: 9.4 mg/dL (ref 8.4–10.5)
Chloride: 105 meq/L (ref 96–112)
Creatinine, Ser: 1.23 mg/dL (ref 0.40–1.50)
GFR: 54.6 mL/min — ABNORMAL LOW (ref >60.00)
Glucose, Bld: 138 mg/dL — ABNORMAL HIGH (ref 70–99)
Potassium: 4 meq/L (ref 3.5–5.1)
Sodium: 139 meq/L (ref 135–145)

## 2024-03-07 ENCOUNTER — Ambulatory Visit (INDEPENDENT_AMBULATORY_CARE_PROVIDER_SITE_OTHER): Payer: Medicare HMO | Admitting: *Deleted

## 2024-03-07 VITALS — Ht 68.0 in | Wt 174.0 lb

## 2024-03-07 DIAGNOSIS — Z0000 Encounter for general adult medical examination without abnormal findings: Secondary | ICD-10-CM | POA: Diagnosis not present

## 2024-03-07 NOTE — Progress Notes (Signed)
 Subjective:   Edwin [name] is a 83 y.o. who presents for a Medicare Wellness preventive visit.  As a reminder, Annual Wellness Visits don't include a physical exam, and some assessments may be limited, especially if this visit is performed virtually. We may recommend an in-person follow-up visit with your provider if needed.  Visit Complete: Virtual I connected with  Edwin [name] on 03/07/24 by a audio enabled telemedicine application and verified that I am speaking with the correct person using two identifiers.  Patient Location: Home  Provider Location: Home Office  I discussed the limitations of evaluation and management by telemedicine. The patient expressed understanding and agreed to proceed.  Vital Signs: Because this visit was a virtual/telehealth visit, some criteria may be missing or patient reported. Any vitals not documented were not able to be obtained and vitals that have been documented are patient reported.  VideoDeclined- This patient declined Librarian, academic. Therefore the visit was completed with audio only.  Persons Participating in Visit: Patient.  AWV Questionnaire: Yes: Patient Medicare AWV questionnaire was completed by the patient on 03/06/24; I have confirmed that all information answered by patient is correct and no changes since this date.  Cardiac Risk Factors include: advanced age (>10men, >28 women);male gender;dyslipidemia;hypertension;Other (see comment), Risk factor comments: CAD, AFib     Objective:    Today's Vitals   03/07/24 1051  Weight: 174 lb (78.9 kg)  Height: 5' 8 (1.727 m)   Body mass index is 26.46 kg/m.     03/07/2024   11:06 AM 07/06/2023   11:00 AM 07/04/2023    9:09 PM 03/04/2023    8:43 AM 02/19/2022   12:40 PM 02/14/2021   10:03 AM 12/31/2020    5:51 AM  Advanced Directives  Does Patient Have a Medical Advance Directive? Yes No No Yes Yes Yes Yes  Type of Engineer, mining of [place];Living will   Healthcare Power of [place];Living will Healthcare Power of [employer] of [place];Living will Living will  Does patient want to make changes to medical advance directive?     No - Patient declined No - Patient declined   Copy of Healthcare Power of Attorney in Chart? No - copy requested   No - copy requested No - copy requested No - copy requested   Would patient like information on creating a medical advance directive?  No - Patient declined         Current Medications (verified) Outpatient Encounter Medications as of 03/07/2024  Medication Sig   apixaban  (ELIQUIS ) 5 MG TABS tablet Take 1 tablet (5 mg total) by mouth 2 (two) times daily.   Cholecalciferol  (VITAMIN D3) 2000 UNITS TABS Take 2,000 Units by mouth daily.    ketoconazole  (NIZORAL ) 2 % shampoo Apply topically. 2-3 times weekly   latanoprost  (XALATAN ) 0.005 % ophthalmic solution Place 1 drop into both eyes at bedtime.   levETIRAcetam  (KEPPRA  XR) 500 MG 24 hr tablet Take 1,000 mg by mouth daily.   levocetirizine (XYZAL ) 5 MG tablet Take 1 tablet (5 mg total) by mouth every evening.   MAGNESIUM GLYCINATE PO Take 240 mg by mouth daily.   metoprolol  succinate (TOPROL  XL) 25 MG 24 hr tablet Take 1 tablet (25 mg total) by mouth daily.   Multiple Vitamin (MULTIVITAMIN) tablet Take 1 tablet by mouth daily.   omeprazole  (PRILOSEC) 20 MG capsule Take 1 capsule (20 mg total) by mouth 2 (two) times daily.   polyethylene glycol (  MIRALAX  / GLYCOLAX ) 17 g packet Take 17 g by mouth daily.   Psyllium 100 % POWD 1 TSP Daily   rosuvastatin  (CRESTOR ) 40 MG tablet Take 1 tablet (40 mg total) by mouth daily.   Saw Palmetto 450 MG CAPS Take 450 mg by mouth daily.    Zinc 50 MG TABS Take 50 mg by mouth daily.   No facility-administered encounter medications on file as of 03/07/2024.    Allergies (verified) Ketamine , Penicillins, and Sulfa antibiotics   History: Past Medical History:  Diagnosis Date    Allergy    Arthritis    Coronary artery disease    a. 12/2013 PCI: LM nl, LAD 50p, 3m (3.0x28 Alpine Xience DES), D1 90, LCX nl, OM1 70, RCA nl; b. 12/2022 MV: apical-mid inflat ischemia, mildly reduced EF.   Diverticulosis of colon    GERD (gastroesophageal reflux disease)    ESOPHAGEAL REFLUX   Glaucoma    Gout    Hemorrhoids    History of kidney stones    Hx of colonic polyp    Hypercholesterolemia    Hypertension    LVH (left ventricular hypertrophy)    a. 12/2022 Echo: EF 60-65%, no rwma, mod conc LVH. Nl RV fxn. RVSP . Mild to mod LAE. Mild MR/AI.   Schatzki's ring    Seizures (HCC)    Past Surgical History:  Procedure Laterality Date   CARDIAC CATHETERIZATION     COLONOSCOPY WITH PROPOFOL  N/A 05/18/2017   Procedure: COLONOSCOPY WITH PROPOFOL ;  Surgeon: Viktoria Lamar DASEN, MD;  Location: [facility] ENDOSCOPY;  Service: Endoscopy;  Laterality: N/A;   CORONARY ANGIOPLASTY     EYE SURGERY  07/20/13   cataract   HERNIA REPAIR Right 2008 & 2012   VASECTOMY  1984   Family History  Problem Relation Age of Onset   Arthritis Mother    Heart disease Mother    Stroke Mother    Hypertension Mother    Arthritis Father    Hyperlipidemia Father    Heart disease Father    Hypertension Father    Parkinson's disease Father    Multiple sclerosis Sister    Colon cancer Maternal Grandmother    Arthritis Paternal Grandmother    Colon cancer Paternal Grandmother    Social History   Socioeconomic History   Marital status: Married    Spouse name: Not on file   Number of children: Not on file   Years of education: Not on file   Highest education level: 12th grade  Occupational History   Not on file  Tobacco Use   Smoking status: Never   Smokeless tobacco: Never  Vaping Use   Vaping status: Never Used  Substance and Sexual Activity   Alcohol use: No    Alcohol/week: 0.0 standard drinks of alcohol   Drug use: No   Sexual activity: Never  Other Topics Concern   Not on file   Social History Narrative   Married   Social Drivers of Health   Financial Resource Strain: Low Risk  (03/06/2024)   Overall Financial Resource Strain (CARDIA)    Difficulty of Paying Living Expenses: Not hard at all  Food Insecurity: No Food Insecurity (03/06/2024)   Hunger Vital Sign    Worried About Running Out of Food in the Last Year: Never true    Ran Out of Food in the Last Year: Never true  Transportation Needs: No Transportation Needs (03/06/2024)   PRAPARE - Administrator, Civil Service (Medical): No  Lack of Transportation (Non-Medical): No  Physical Activity: Inactive (03/06/2024)   Exercise Vital Sign    Days of Exercise per Week: 0 days    Minutes of Exercise per Session: 0 min  Stress: No Stress Concern Present (03/06/2024)   [employer] of Occupational Health - Occupational Stress Questionnaire    Feeling of Stress: Only a little  Social Connections: Socially Integrated (03/06/2024)   Social Connection and Isolation Panel    Frequency of Communication with Friends and Family: Once a week    Frequency of Social Gatherings with Friends and Family: More than three times a week    Attends Religious Services: More than 4 times per year    Active Member of [employer] or Organizations: Yes    Attends Engineer, structural: More than 4 times per year    Marital Status: Married    Tobacco Counseling Counseling given: Not Answered    Clinical Intake:  Pre-visit preparation completed: Yes  Pain : No/denies pain     BMI - recorded: 26.46 Nutritional Status: BMI 25 -29 Overweight Nutritional Risks: None Diabetes: No  Lab Results  Component Value Date   HGBA1C 5.9 02/04/2024   HGBA1C 5.7 10/05/2023   HGBA1C 5.8 01/20/2023     How often do you need to have someone help you when you read instructions, pamphlets, or other written materials from your doctor or pharmacy?: 1 - Never  Interpreter Needed?: No  Information entered by :: R. Cheetara Hoge  LPN   Activities of Daily Living     03/06/2024    1:13 PM 07/06/2023   11:00 AM  In your present state of health, do you have any difficulty performing the following activities:  Hearing? 1 0  Comment wears aids   Vision? 0 0  Comment glasses   Difficulty concentrating or making decisions? 0 0  Walking or climbing stairs? 0   Dressing or bathing? 0   Doing errands, shopping? 0 0  Preparing Food and eating ? N   Using the Toilet? N   In the past six months, have you accidently leaked urine? N   Do you have problems with loss of bowel control? N   Managing your Medications? N   Managing your Finances? N   Housekeeping or managing your Housekeeping? N     Patient Care Team: Glendia Shad, MD as PCP - General (Internal Medicine) Perla Evalene PARAS, MD as PCP - Cardiology (Cardiology)  I have updated your Care Teams any recent Medical Services you may have received from other providers in the past year.     Assessment:   This is a routine wellness examination for [place].  Hearing/Vision screen Hearing Screening - Comments:: Wears aids Vision Screening - Comments:: glasses   Goals Addressed             This Visit's Progress    Patient Stated       Wants to exercise and stay active       Depression Screen     03/07/2024   11:00 AM 03/04/2023    8:36 AM 01/23/2023    9:09 AM 09/24/2022    8:06 AM 05/23/2022    8:24 AM 02/19/2022   12:45 PM 01/14/2022    9:58 AM  PHQ 2/9 Scores  PHQ - 2 Score 0 0 0 0 0 0 0  PHQ- 9 Score 2 2         Fall Risk     03/06/2024    1:13  PM 03/04/2023    8:45 AM 01/23/2023    9:09 AM 09/24/2022    8:05 AM 05/23/2022    8:23 AM  Fall Risk   Falls in the past year? 0 0 0 0 0  Number falls in past yr: 0 0 0 0   Injury with Fall? 0 0 0 0   Risk for fall due to : No Fall Risks No Fall Risks No Fall Risks No Fall Risks No Fall Risks  Follow up Falls evaluation completed;Falls prevention discussed Falls prevention discussed;Falls evaluation  completed;Education provided Falls evaluation completed Falls evaluation completed  Falls evaluation completed      Data saved with a previous flowsheet row definition    MEDICARE RISK AT HOME:  Medicare Risk at Home Any stairs in or around the home?: (Patient-Rptd) Yes If so, are there any without handrails?: (Patient-Rptd) No Home free of loose throw rugs in walkways, pet beds, electrical cords, etc?: (Patient-Rptd) Yes Adequate lighting in your home to reduce risk of falls?: (Patient-Rptd) Yes Life alert?: (Patient-Rptd) No Use of a cane, walker or w/c?: (Patient-Rptd) No Grab bars in the bathroom?: (Patient-Rptd) No Shower chair or bench in shower?: (Patient-Rptd) No Elevated toilet seat or a handicapped toilet?: (Patient-Rptd) No  TIMED UP AND GO:  Was the test performed?  No  Cognitive Function: 6CIT completed    10/14/2017   10:51 AM 10/12/2015    9:52 AM  MMSE - Mini Mental State Exam  Orientation to time 5  5   Orientation to Place 5  5   Registration 3  3   Attention/ Calculation 5  5   Recall 2  3   Language- name 2 objects 2  2   Language- repeat 1 1  Language- follow 3 step command 3  3   Language- read & follow direction 1  1   Write a sentence 1  1   Copy design 1  1   Total score 29  30      Data saved with a previous flowsheet row definition        03/07/2024   11:06 AM 03/04/2023    9:01 AM 02/14/2020   10:56 AM 10/13/2016   10:47 AM  6CIT Screen  What Year? 0 points 0 points  0 points  What month? 0 points 0 points  0 points  What time? 0 points 0 points  0 points  Count back from 20 0 points 0 points  0 points  Months in reverse 0 points 0 points 0 points 0 points  Repeat phrase 0 points 0 points  0 points  Total Score 0 points 0 points  0 points    Immunizations Immunization History  Administered Date(s) Administered   Fluad Quad(high Dose 65+) 06/29/2019, 06/04/2020, 05/15/2021, 05/23/2022   Fluad Trivalent(High Dose 65+) 06/16/2023    Influenza, High Dose Seasonal PF 06/12/2016, 06/17/2017, 06/23/2018   Influenza,inj,Quad PF,6+ Mos 07/29/2013, 06/20/2014, 07/18/2015   PFIZER(Purple Top)SARS-COV-2 Vaccination 09/19/2019, 10/10/2019   Pneumococcal Conjugate-13 08/09/2013   Pneumococcal Polysaccharide-23 07/18/2015   Tdap 09/01/2004, 07/05/2014   Zoster Recombinant(Shingrix) 06/14/2019, 12/09/2019   Zoster, Live 08/29/2013    Screening Tests Health Maintenance  Topic Date Due   COVID-19 Vaccine (3 - 2024-25 season) 05/03/2023   Medicare Annual Wellness (AWV)  03/03/2024   INFLUENZA VACCINE  04/01/2024   DTaP/Tdap/Td (3 - Td or Tdap) 07/05/2024   Pneumococcal Vaccine: 50+ Years  Completed   Zoster Vaccines- Shingrix  Completed   Hepatitis  B Vaccines  Aged Out   HPV VACCINES  Aged Out   Meningococcal B Vaccine  Aged Out   Hepatitis C Screening  Discontinued    Health Maintenance  Health Maintenance Due  Topic Date Due   COVID-19 Vaccine (3 - 2024-25 season) 05/03/2023   Medicare Annual Wellness (AWV)  03/03/2024   Health Maintenance Items Addressed: Patient declines covid vaccine. Discussed when tetanus vaccine is due to update.  Additional Screening:  Vision Screening: Recommended annual ophthalmology exams for early detection of glaucoma and other disorders of the eye. Up to date [facility] Would you like a referral to an eye doctor? No    Dental Screening: Recommended annual dental exams for proper oral hygiene  Community Resource Referral / Chronic Care Management: CRR required this visit?  No   CCM required this visit?  No   Plan:    I have personally reviewed and noted the following in the patient's chart:   Medical and social history Use of alcohol, tobacco or illicit drugs  Current medications and supplements including opioid prescriptions. Patient is not currently taking opioid prescriptions. Functional ability and status Nutritional status Physical activity Advanced  directives List of other physicians Hospitalizations, surgeries, and ER visits in previous 12 months Vitals Screenings to include cognitive, depression, and falls Referrals and appointments  In addition, I have reviewed and discussed with patient certain preventive protocols, quality metrics, and best practice recommendations. A written personalized care plan for preventive services as well as general preventive health recommendations were provided to patient.   Angeline Fredericks, LPN   10/03/7972   After Visit Summary: (MyChart) Due to this being a telephonic visit, the after visit summary with patients personalized plan was offered to patient via MyChart   Notes: Nothing significant to report at this time.

## 2024-03-07 NOTE — Patient Instructions (Signed)
 [name] , Thank you for taking time out of your busy schedule to complete your Annual Wellness Visit with me. I enjoyed our conversation and look forward to speaking with you again next year. I, as well as your care team,  appreciate your ongoing commitment to your health goals. Please review the following plan we discussed and let me know if I can assist you in the future. Your Game plan/ To Do List    Referrals: If you haven't heard from the office you've been referred to, please reach out to them at the phone provided.  Remember to get your annual flu vaccine and update your Tetanus vaccine when it is due. Follow up Visits: Next Medicare AWV with our clinical staff: 03/08/25 @ 11:30   Have you seen your provider in the last 6 months (3 months if uncontrolled diabetes)? Yes Next Office Visit with your provider: 06/13/24  Clinician Recommendations:  Aim for 30 minutes of exercise or brisk walking, 6-8 glasses of water, and 5 servings of fruits and vegetables each day.       This is a list of the screening recommended for you and due dates:  Health Maintenance  Topic Date Due   COVID-19 Vaccine (3 - 2024-25 season) 05/03/2023   Flu Shot  04/01/2024   DTaP/Tdap/Td vaccine (3 - Td or Tdap) 07/05/2024   Medicare Annual Wellness Visit  03/07/2025   Pneumococcal Vaccine for age over 80  Completed   Zoster (Shingles) Vaccine  Completed   Hepatitis B Vaccine  Aged Out   HPV Vaccine  Aged Out   Meningitis B Vaccine  Aged Out   Hepatitis C Screening  Discontinued    Advanced directives: (Copy Requested) Please bring a copy of your health care power of attorney and living will to the office to be added to your chart at your convenience. You can mail to [facility] [address] [place], [place] [address] or email to [email or web address] Advance Care Planning is important because it:  [x]  Makes sure you receive the medical care that is consistent with your values,  goals, and preferences  [x]  It provides guidance to your family and loved ones and reduces their decisional burden about whether or not they are making the right decisions based on your wishes.

## 2024-03-14 ENCOUNTER — Ambulatory Visit: Admitting: Urology

## 2024-03-14 ENCOUNTER — Encounter: Payer: Self-pay | Admitting: Urology

## 2024-03-14 VITALS — BP 125/75 | HR 61 | Ht 68.0 in | Wt 174.0 lb

## 2024-03-14 DIAGNOSIS — R868 Other abnormal findings in specimens from male genital organs: Secondary | ICD-10-CM | POA: Diagnosis not present

## 2024-03-14 DIAGNOSIS — Z125 Encounter for screening for malignant neoplasm of prostate: Secondary | ICD-10-CM

## 2024-03-14 NOTE — Progress Notes (Signed)
 I, Maysun LITTIE Griffiths, acting as a scribe for Glendia JAYSON Barba, MD., have documented all relevant documentation on the behalf of Glendia JAYSON Barba, MD, as directed by Glendia JAYSON Barba, MD while in the presence of Glendia JAYSON Barba, MD.  03/14/2024 2:27 PM   Edwin [name] [birth year] [id number]  Referring provider: Glendia Shad, MD [address] [place],  [place] [address]  Chief Complaint  Patient presents with   New Patient (Initial Visit)    HPI: Edwin [name] is a 83 y.o. male referred for evaluation of discolored semen.  Masturbated last month and noticed his semen appeared to be more transparent. He was concerned about the possibility of prostate cancer and scheduled a follow-up appointment with Dr. Glendia.  His wife has some medical problems and he is not sexually active and masturbates on occasions. He had no blood in the semen.  He notes occasional urgency when changing positions from sitting to standing. He typically does not have to get up at night to void, but will occasionally get up once per night.  PSA levels have been checked regularly and are in the low 1 range.  Prior history of stone disease. Several years since his last stone. CT 2020 showed no urinary calculi.  PSA trend    PSA  Latest Ref Rng 0.10 - 4.00 ng/ml  08/08/2019 1.37   09/10/2020 0.93   09/13/2021 1.19   01/20/2023 1.04   02/04/2024 1.23      PMH: Past Medical History:  Diagnosis Date   Allergy    Arthritis    Coronary artery disease    a. 12/2013 PCI: LM nl, LAD 50p, 61m (3.0x28 Alpine Xience DES), D1 90, LCX nl, OM1 70, RCA nl; b. 12/2022 MV: apical-mid inflat ischemia, mildly reduced EF.   Diverticulosis of colon    GERD (gastroesophageal reflux disease)    ESOPHAGEAL REFLUX   Glaucoma    Gout    Hemorrhoids    History of kidney stones    Hx of colonic polyp    Hypercholesterolemia    Hypertension    LVH (left ventricular hypertrophy)    a. 12/2022 Echo: EF 60-65%,  no rwma, mod conc LVH. Nl RV fxn. RVSP . Mild to mod LAE. Mild MR/AI.   Schatzki's ring    Seizures (HCC)     Surgical History: Past Surgical History:  Procedure Laterality Date   CARDIAC CATHETERIZATION     COLONOSCOPY WITH PROPOFOL  N/A 05/18/2017   Procedure: COLONOSCOPY WITH PROPOFOL ;  Surgeon: Viktoria Lamar DASEN, MD;  Location: [facility] ENDOSCOPY;  Service: Endoscopy;  Laterality: N/A;   CORONARY ANGIOPLASTY     EYE SURGERY  07/20/13   cataract   HERNIA REPAIR Right 2008 & 2012   VASECTOMY  1984    Home Medications:  Allergies as of 03/14/2024       Reactions   Ketamine  Anxiety, Other (See Comments)   Penicillins Rash   Sulfa Antibiotics Rash        Medication List        Accurate as of March 14, 2024  2:27 PM. If you have any questions, ask your nurse or doctor.          apixaban  5 MG Tabs tablet Commonly known as: ELIQUIS  Take 1 tablet (5 mg total) by mouth 2 (two) times daily.   ketoconazole  2 % shampoo Commonly known as: NIZORAL  Apply topically. 2-3 times weekly   latanoprost  0.005 % ophthalmic solution Commonly known as:  XALATAN  Place 1 drop into both eyes at bedtime.   levETIRAcetam  500 MG 24 hr tablet Commonly known as: KEPPRA  XR Take 1,000 mg by mouth daily.   levocetirizine 5 MG tablet Commonly known as: XYZAL  Take 1 tablet (5 mg total) by mouth every evening.   MAGNESIUM GLYCINATE PO Take 240 mg by mouth daily.   metoprolol  succinate 25 MG 24 hr tablet Commonly known as: Toprol  XL Take 1 tablet (25 mg total) by mouth daily.   multivitamin tablet Take 1 tablet by mouth daily.   omeprazole  20 MG capsule Commonly known as: PRILOSEC Take 1 capsule (20 mg total) by mouth 2 (two) times daily.   polyethylene glycol 17 g packet Commonly known as: MIRALAX  / GLYCOLAX  Take 17 g by mouth daily.   Psyllium 100 % Powd 1 TSP Daily   rosuvastatin  40 MG tablet Commonly known as: CRESTOR  Take 1 tablet (40 mg total) by mouth daily.   Saw  Palmetto 450 MG Caps Take 450 mg by mouth daily.   Vitamin D3 50 MCG (2000 UT) Tabs Take 2,000 Units by mouth daily.   Zinc 50 MG Tabs Take 50 mg by mouth daily.        Allergies:  Allergies  Allergen Reactions   Ketamine  Anxiety and Other (See Comments)   Penicillins Rash   Sulfa Antibiotics Rash    Family History: Family History  Problem Relation Age of Onset   Arthritis Mother    Heart disease Mother    Stroke Mother    Hypertension Mother    Arthritis Father    Hyperlipidemia Father    Heart disease Father    Hypertension Father    Parkinson's disease Father    Multiple sclerosis Sister    Colon cancer Maternal Grandmother    Arthritis Paternal Grandmother    Colon cancer Paternal Grandmother     Social History:  reports that he has never smoked. He has never used smokeless tobacco. He reports that he does not drink alcohol and does not use drugs.   Physical Exam: BP 125/75   Pulse 61   Ht 5' 8 (1.727 m)   Wt 174 lb (78.9 kg)   BMI 26.46 kg/m   Constitutional:  Alert and oriented, No acute distress. HEENT: [place] AT Respiratory: Normal respiratory effort, no increased work of breathing. GU: Prostate 40 grams, smooth without nodules or induration. Psychiatric: Normal mood and affect.   Pertinent Imaging: CT was personally reviewed and interpreted.   CT   EXAM: CT ABDOMEN AND PELVIS WITH CONTRAST   TECHNIQUE: Multidetector CT imaging of the abdomen and pelvis was performed using the standard protocol following bolus administration of intravenous contrast.   CONTRAST:  OMNIPAQUE  IOHEXOL  300 MG/ML  SOLN   COMPARISON:  Abdominal ultrasound 07/16/2011   FINDINGS: Lower chest: Mild cardiomegaly. Coronary artery calcifications. Atelectasis within the dependent lung bases.   Hepatobiliary: No focal liver lesion.No biliary ductal dilatation. The gallbladder is distended. No radiopaque gallstone is appreciated. Prominent inflammatory  stranding within the gallbladder fossa.   Pancreas: No focal lesion.No ductal dilatation or peripancreatic edema.   Spleen: No focal lesion.No splenomegaly.   Adrenals/Urinary Tract:   -- No adrenal gland mass.   -- Symmetric renal enhancement.No renal mass. Right renal cyst. No hydronephrosis.The bladder is unremarkable.   Stomach/Bowel: Small to moderate-sized hiatal hernia. The stomach is otherwise unremarkable. No dilated loops of bowel are demonstrated to suggest bowel obstruction. There is mild wall thickening of the hepatic flexure, likely  reactive. No other bowel wall thickening. The appendix is not clearly delineated. However, no significant pericecal inflammatory changes are demonstrated. Colonic diverticulosis without CT evidence of diverticulitis.   Vascular/Lymphatic:   -- No abdominal aortic aneurysm. Aortoiliac atherosclerosis. Additional arterial calcifications.   -- The IVC, portal vein, SMV and splenic vein are patent.   -- No lymphadenopathy.   Reproductive: Incompletely assessed by CT modality.Enlarged prostate.   Other: Small volume free fluid is present along the right hepatic lobe and within the pelvis. A small amount of edema also tracks within the right paracolic gutter.   Musculoskeletal: Mild presumed chronic anterior wedging of multiple lower thoracic vertebrae.   These results were called by telephone at the time of interpretation on 07/18/2019 at 2:17 pm to provider PHILLIP STAFFORD , who verbally acknowledged these results.   IMPRESSION: 1. Distended gallbladder with surrounding inflammatory changes. Findings likely reflect acute cholecystitis, particularly given findings on recent right upper quadrant ultrasound performed 07/16/2019. 2. Small volume likely reactive ascites along the right hepatic lobe and within the pelvis. 3. Small to moderate-sized hiatal hernia. 4. Colonic diverticulosis without CT evidence of  diverticulitis. 5. Mild cardiomegaly with coronary artery disease. 6. Aortic atherosclerosis.     Electronically Signed   By: Rockey Childs DO   On: 07/18/2019 14:19   Assessment & Plan:    1. Discolored semen We discussed there are several factors which can affect the color of semen including advancing age and infrequent ejaculation.  We discussed this is not representative of significant pathology  2. Prostate cancer screening PSA has been low and stable for several years. Current prostate cancer screening guidelines do not recommend prostate cancer screening after age 52 and would recommend discontinuing.  I have reviewed the above documentation for accuracy and completeness, and I agree with the above.   Glendia JAYSON Barba, MD  [facility] [address] [place], [place] [address] [phone]

## 2024-03-24 ENCOUNTER — Other Ambulatory Visit (INDEPENDENT_AMBULATORY_CARE_PROVIDER_SITE_OTHER)

## 2024-03-24 ENCOUNTER — Ambulatory Visit: Payer: Self-pay | Admitting: Internal Medicine

## 2024-03-24 DIAGNOSIS — R944 Abnormal results of kidney function studies: Secondary | ICD-10-CM

## 2024-03-24 LAB — BASIC METABOLIC PANEL WITH GFR
BUN: 15 mg/dL (ref 6–23)
CO2: 28 meq/L (ref 19–32)
Calcium: 9.4 mg/dL (ref 8.4–10.5)
Chloride: 105 meq/L (ref 96–112)
Creatinine, Ser: 1.19 mg/dL (ref 0.40–1.50)
GFR: 56.78 mL/min — ABNORMAL LOW (ref >60.00)
Glucose, Bld: 121 mg/dL — ABNORMAL HIGH (ref 70–99)
Potassium: 4.1 meq/L (ref 3.5–5.1)
Sodium: 140 meq/L (ref 135–145)

## 2024-04-02 ENCOUNTER — Other Ambulatory Visit: Payer: Self-pay | Admitting: Internal Medicine

## 2024-06-09 ENCOUNTER — Other Ambulatory Visit (INDEPENDENT_AMBULATORY_CARE_PROVIDER_SITE_OTHER)

## 2024-06-09 ENCOUNTER — Other Ambulatory Visit: Payer: Self-pay

## 2024-06-09 DIAGNOSIS — R739 Hyperglycemia, unspecified: Secondary | ICD-10-CM

## 2024-06-09 DIAGNOSIS — I10 Essential (primary) hypertension: Secondary | ICD-10-CM

## 2024-06-09 DIAGNOSIS — E7800 Pure hypercholesterolemia, unspecified: Secondary | ICD-10-CM

## 2024-06-09 DIAGNOSIS — D75839 Thrombocytosis, unspecified: Secondary | ICD-10-CM | POA: Diagnosis not present

## 2024-06-09 LAB — CBC WITH DIFFERENTIAL/PLATELET
Basophils Absolute: 0.1 K/uL (ref 0.0–0.1)
Basophils Relative: 0.7 % (ref 0.0–3.0)
Eosinophils Absolute: 0.3 K/uL (ref 0.0–0.7)
Eosinophils Relative: 4.3 % (ref 0.0–5.0)
HCT: 43.2 % (ref 39.0–52.0)
Hemoglobin: 14.8 g/dL (ref 13.0–17.0)
Lymphocytes Relative: 29.1 % (ref 12.0–46.0)
Lymphs Abs: 2 K/uL (ref 0.7–4.0)
MCHC: 34.4 g/dL (ref 30.0–36.0)
MCV: 84.7 fl (ref 78.0–100.0)
Monocytes Absolute: 0.7 K/uL (ref 0.1–1.0)
Monocytes Relative: 10 % (ref 3.0–12.0)
Neutro Abs: 3.9 K/uL (ref 1.4–7.7)
Neutrophils Relative %: 55.9 % (ref 43.0–77.0)
Platelets: 228 K/uL (ref 150.0–400.0)
RBC: 5.1 Mil/uL (ref 4.22–5.81)
RDW: 13.6 % (ref 11.5–15.5)
WBC: 6.9 K/uL (ref 4.0–10.5)

## 2024-06-09 LAB — HEPATIC FUNCTION PANEL
ALT: 18 U/L (ref 0–53)
AST: 23 U/L (ref 0–37)
Albumin: 4.1 g/dL (ref 3.5–5.2)
Alkaline Phosphatase: 63 U/L (ref 39–117)
Bilirubin, Direct: 0.1 mg/dL (ref 0.0–0.3)
Total Bilirubin: 0.9 mg/dL (ref 0.2–1.2)
Total Protein: 7.1 g/dL (ref 6.0–8.3)

## 2024-06-09 LAB — LIPID PANEL
Cholesterol: 118 mg/dL (ref 0–200)
HDL: 29.7 mg/dL — ABNORMAL LOW (ref >39.00)
LDL Cholesterol: 47 mg/dL (ref 0–99)
NonHDL: 87.85
Total CHOL/HDL Ratio: 4
Triglycerides: 205 mg/dL — ABNORMAL HIGH (ref 0.0–149.0)
VLDL: 41 mg/dL — ABNORMAL HIGH (ref 0.0–40.0)

## 2024-06-09 LAB — BASIC METABOLIC PANEL WITH GFR
BUN: 12 mg/dL (ref 6–23)
CO2: 25 meq/L (ref 19–32)
Calcium: 9.1 mg/dL (ref 8.4–10.5)
Chloride: 105 meq/L (ref 96–112)
Creatinine, Ser: 1.33 mg/dL (ref 0.40–1.50)
GFR: 49.61 mL/min — ABNORMAL LOW (ref >60.00)
Glucose, Bld: 107 mg/dL — ABNORMAL HIGH (ref 70–99)
Potassium: 4 meq/L (ref 3.5–5.1)
Sodium: 139 meq/L (ref 135–145)

## 2024-06-09 LAB — HEMOGLOBIN A1C: Hgb A1c MFr Bld: 6 % (ref 4.6–6.5)

## 2024-06-10 ENCOUNTER — Ambulatory Visit: Payer: Self-pay | Admitting: Internal Medicine

## 2024-06-13 ENCOUNTER — Encounter: Payer: Self-pay | Admitting: Internal Medicine

## 2024-06-13 ENCOUNTER — Ambulatory Visit: Admitting: Internal Medicine

## 2024-06-13 VITALS — BP 114/72 | HR 74 | Temp 98.0°F | Resp 16 | Ht 68.0 in | Wt 177.6 lb

## 2024-06-13 DIAGNOSIS — R739 Hyperglycemia, unspecified: Secondary | ICD-10-CM | POA: Diagnosis not present

## 2024-06-13 DIAGNOSIS — Z23 Encounter for immunization: Secondary | ICD-10-CM | POA: Diagnosis not present

## 2024-06-13 DIAGNOSIS — I25119 Atherosclerotic heart disease of native coronary artery with unspecified angina pectoris: Secondary | ICD-10-CM

## 2024-06-13 DIAGNOSIS — G40909 Epilepsy, unspecified, not intractable, without status epilepticus: Secondary | ICD-10-CM | POA: Diagnosis not present

## 2024-06-13 DIAGNOSIS — I700 Atherosclerosis of aorta: Secondary | ICD-10-CM

## 2024-06-13 DIAGNOSIS — G4730 Sleep apnea, unspecified: Secondary | ICD-10-CM

## 2024-06-13 DIAGNOSIS — E7800 Pure hypercholesterolemia, unspecified: Secondary | ICD-10-CM | POA: Diagnosis not present

## 2024-06-13 DIAGNOSIS — Z0000 Encounter for general adult medical examination without abnormal findings: Secondary | ICD-10-CM

## 2024-06-13 DIAGNOSIS — K219 Gastro-esophageal reflux disease without esophagitis: Secondary | ICD-10-CM | POA: Diagnosis not present

## 2024-06-13 DIAGNOSIS — I10 Essential (primary) hypertension: Secondary | ICD-10-CM | POA: Diagnosis not present

## 2024-06-13 DIAGNOSIS — R868 Other abnormal findings in specimens from male genital organs: Secondary | ICD-10-CM

## 2024-06-13 DIAGNOSIS — I4891 Unspecified atrial fibrillation: Secondary | ICD-10-CM

## 2024-06-13 NOTE — Assessment & Plan Note (Addendum)
 Physical today 06/13/24  Colonoscopy 05/2017 .  psa 02/04/24 - 1.23.

## 2024-06-13 NOTE — Progress Notes (Signed)
 Subjective:    Patient ID: Edwin [name], male    DOB: 01-22-1941, 83 y.o.   MRN: [id number]  Patient here for  Chief Complaint  Patient presents with   Annual Exam    HPI Here for a physical exam. Saw cardiology 02/05/24 - recommended to decrease metoprolol  to 12.5mg  q day. Also recommended to stop aspirin . He is using cpap. Continues on eliquis  - afib. Had f/u with neurology 02/09/24 - f/u history of seizure - no breakthrough seizures. Continue keppra . Also f/u sleep apnea - using flonase  and autopap. Also recommended continuing aspirin  and simvastatin .  Saw Dr Twylla for evaluation of discolored semen. No further w/up felt warranted and recommended no further psa checks. Taking miralax  - daily - bowels moving.    Past Medical History:  Diagnosis Date   Allergy    Arthritis    Coronary artery disease    a. 12/2013 PCI: LM nl, LAD 50p, 9m (3.0x28 Alpine Xience DES), D1 90, LCX nl, OM1 70, RCA nl; b. 12/2022 MV: apical-mid inflat ischemia, mildly reduced EF.   Diverticulosis of colon    GERD (gastroesophageal reflux disease)    ESOPHAGEAL REFLUX   Glaucoma    Gout    Hemorrhoids    History of kidney stones    Hx of colonic polyp    Hypercholesterolemia    Hypertension    LVH (left ventricular hypertrophy)    a. 12/2022 Echo: EF 60-65%, no rwma, mod conc LVH. Nl RV fxn. RVSP . Mild to mod LAE. Mild MR/AI.   Schatzki's ring    Seizures (HCC)    Past Surgical History:  Procedure Laterality Date   CARDIAC CATHETERIZATION     COLONOSCOPY WITH PROPOFOL  N/A 05/18/2017   Procedure: COLONOSCOPY WITH PROPOFOL ;  Surgeon: Viktoria Lamar DASEN, MD;  Location: [facility] ENDOSCOPY;  Service: Endoscopy;  Laterality: N/A;   CORONARY ANGIOPLASTY     EYE SURGERY  07/20/13   cataract   HERNIA REPAIR Right 2008 & 2012   VASECTOMY  1984   Family History  Problem Relation Age of Onset   Arthritis Mother    Heart disease Mother    Stroke Mother    Hypertension Mother    Arthritis Father     Hyperlipidemia Father    Heart disease Father    Hypertension Father    Parkinson's disease Father    Multiple sclerosis Sister    Colon cancer Maternal Grandmother    Arthritis Paternal Grandmother    Colon cancer Paternal Grandmother    Social History   Socioeconomic History   Marital status: Married    Spouse name: Not on file   Number of children: Not on file   Years of education: Not on file   Highest education level: 12th grade  Occupational History   Not on file  Tobacco Use   Smoking status: Never   Smokeless tobacco: Never  Vaping Use   Vaping status: Never Used  Substance and Sexual Activity   Alcohol use: No    Alcohol/week: 0.0 standard drinks of alcohol   Drug use: No   Sexual activity: Never  Other Topics Concern   Not on file  Social History Narrative   Married   Social Drivers of Health   Financial Resource Strain: Low Risk  (03/06/2024)   Overall Financial Resource Strain (CARDIA)    Difficulty of Paying Living Expenses: Not hard at all  Food Insecurity: No Food Insecurity (03/06/2024)   Hunger Vital Sign  Worried About Programme researcher, broadcasting/film/video in the Last Year: Never true    Ran Out of Food in the Last Year: Never true  Transportation Needs: No Transportation Needs (03/06/2024)   PRAPARE - Administrator, Civil Service (Medical): No    Lack of Transportation (Non-Medical): No  Physical Activity: Inactive (03/06/2024)   Exercise Vital Sign    Days of Exercise per Week: 0 days    Minutes of Exercise per Session: 0 min  Stress: No Stress Concern Present (03/06/2024)   [employer] of Occupational Health - Occupational Stress Questionnaire    Feeling of Stress: Only a little  Social Connections: Socially Integrated (03/06/2024)   Social Connection and Isolation Panel    Frequency of Communication with Friends and Family: Once a week    Frequency of Social Gatherings with Friends and Family: More than three times a week    Attends Religious  Services: More than 4 times per year    Active Member of [employer] or Organizations: Yes    Attends Engineer, structural: More than 4 times per year    Marital Status: Married     Review of Systems  Constitutional:  Negative for appetite change and unexpected weight change.  HENT:  Negative for congestion and sinus pressure.   Eyes:  Negative for pain and visual disturbance.  Respiratory:  Negative for cough, chest tightness and shortness of breath.   Cardiovascular:  Negative for chest pain, palpitations and leg swelling.  Gastrointestinal:  Negative for abdominal pain, diarrhea, nausea and vomiting.  Genitourinary:  Negative for difficulty urinating and dysuria.  Musculoskeletal:  Negative for joint swelling and myalgias.  Skin:  Negative for color change and rash.  Neurological:  Negative for dizziness and headaches.  Hematological:  Negative for adenopathy. Does not bruise/bleed easily.  Psychiatric/Behavioral:  Negative for agitation and dysphoric mood.        Objective:     BP 114/72   Pulse 74   Temp 98 F (36.7 C)   Resp 16   Ht 5' 8 (1.727 m)   Wt 177 lb 9.6 oz (80.6 kg)   SpO2 98%   BMI 27.00 kg/m  Wt Readings from Last 3 Encounters:  06/13/24 177 lb 9.6 oz (80.6 kg)  03/14/24 174 lb (78.9 kg)  03/07/24 174 lb (78.9 kg)    Physical Exam Constitutional:      General: He is not in acute distress.    Appearance: Normal appearance. He is well-developed.  HENT:     Head: Normocephalic and atraumatic.     Right Ear: External ear normal.     Left Ear: External ear normal.     Mouth/Throat:     Pharynx: No oropharyngeal exudate or posterior oropharyngeal erythema.  Eyes:     General: No scleral icterus.       Right eye: No discharge.        Left eye: No discharge.     Conjunctiva/sclera: Conjunctivae normal.  Neck:     Thyroid : No thyromegaly.  Cardiovascular:     Rate and Rhythm: Normal rate and regular rhythm.  Pulmonary:     Effort: No  respiratory distress.     Breath sounds: Normal breath sounds. No wheezing.  Abdominal:     General: Bowel sounds are normal.     Palpations: Abdomen is soft.     Tenderness: There is no abdominal tenderness.  Musculoskeletal:        General: No swelling or  tenderness.     Cervical back: Neck supple. No tenderness.  Lymphadenopathy:     Cervical: No cervical adenopathy.  Skin:    Findings: No erythema or rash.  Neurological:     Mental Status: He is alert and oriented to person, place, and time.  Psychiatric:        Mood and Affect: Mood normal.        Behavior: Behavior normal.         Outpatient Encounter Medications as of 06/13/2024  Medication Sig   apixaban  (ELIQUIS ) 5 MG TABS tablet Take 1 tablet (5 mg total) by mouth 2 (two) times daily.   Cholecalciferol  (VITAMIN D3) 2000 UNITS TABS Take 2,000 Units by mouth daily.    ketoconazole  (NIZORAL ) 2 % shampoo Apply topically. 2-3 times weekly   latanoprost  (XALATAN ) 0.005 % ophthalmic solution Place 1 drop into both eyes at bedtime.   levETIRAcetam  (KEPPRA  XR) 500 MG 24 hr tablet Take 1,000 mg by mouth daily.   levocetirizine (XYZAL ) 5 MG tablet Take 1 tablet (5 mg total) by mouth every evening.   MAGNESIUM GLYCINATE PO Take 240 mg by mouth daily.   metoprolol  succinate (TOPROL  XL) 25 MG 24 hr tablet Take 1 tablet (25 mg total) by mouth daily.   Multiple Vitamin (MULTIVITAMIN) tablet Take 1 tablet by mouth daily.   omeprazole  (PRILOSEC) 20 MG capsule Take 1 capsule (20 mg total) by mouth 2 (two) times daily.   polyethylene glycol (MIRALAX  / GLYCOLAX ) 17 g packet Take 17 g by mouth daily.   Psyllium 100 % POWD 1 TSP Daily   Saw Palmetto 450 MG CAPS Take 450 mg by mouth daily.    Zinc 50 MG TABS Take 50 mg by mouth daily.   [DISCONTINUED] rosuvastatin  (CRESTOR ) 40 MG tablet TAKE 1 TABLET EVERY DAY   No facility-administered encounter medications on file as of 06/13/2024.     Lab Results  Component Value Date   WBC 6.9  06/09/2024   HGB 14.8 06/09/2024   HCT 43.2 06/09/2024   PLT 228.0 06/09/2024   GLUCOSE 107 (H) 06/09/2024   CHOL 118 06/09/2024   TRIG 205.0 (H) 06/09/2024   HDL 29.70 (L) 06/09/2024   LDLDIRECT 99.1 07/29/2013   LDLCALC 47 06/09/2024   ALT 18 06/09/2024   AST 23 06/09/2024   NA 139 06/09/2024   K 4.0 06/09/2024   CL 105 06/09/2024   CREATININE 1.33 06/09/2024   BUN 12 06/09/2024   CO2 25 06/09/2024   TSH 2.74 02/04/2024   PSA 1.23 02/04/2024   HGBA1C 6.0 06/09/2024    DG Chest 1 View Result Date: 07/06/2023 CLINICAL DATA:  [id number].  Pneumonia follow-up. EXAM: CHEST  1 VIEW COMPARISON:  PA and lateral 07/04/2023. FINDINGS: 6:47 a.m. Cardiomegaly. Mild increased central vascular prominence without overt edema. There is increased patchy hazy airspace disease left mid to lower lung field, probably in the lower lobe, and scattered opacities newly noted in the right lower lung field most likely new areas of pneumonia. There is no substantial pleural effusion. The remaining lungs are clear. Stable mediastinum with mild aortic tortuosity and atherosclerosis. No new osseous abnormality. IMPRESSION: 1. Increased patchy hazy airspace disease left mid to lower lung field, probably in the lower lobe, and scattered opacities newly noted in the right lower lung field most likely new areas of pneumonia. 2. Cardiomegaly with mild increased central vascular prominence without overt edema. Electronically Signed   By: Francis Quam M.D.   On: 07/06/2023 07:02  Assessment & Plan:  Health care maintenance Assessment & Plan: Physical today 06/13/24  Colonoscopy 05/2017 .  psa 02/04/24 - 1.23.    Flu vaccine need -     Flu vaccine HIGH DOSE PF(Fluzone Trivalent)  Sleep apnea, unspecified type Assessment & Plan: Continue cpap.    Seizure disorder [facility]) Assessment & Plan: Doing well on keppra . No seizures. Follow.    Hyperglycemia Assessment & Plan: Low carb diet and exercise. Follow met b  and A1c.    Hypercholesterolemia Assessment & Plan: Continue crestor .  Continue diet and exercise.  Follow lipid panel and liver function tests.  Lab Results  Component Value Date   CHOL 118 06/09/2024   HDL 29.70 (L) 06/09/2024   LDLCALC 47 06/09/2024   LDLDIRECT 99.1 07/29/2013   TRIG 205.0 (H) 06/09/2024   CHOLHDL 4 06/09/2024      Gastroesophageal reflux disease, unspecified whether esophagitis present Assessment & Plan: No upper symptoms. Continue omeprazole .    Essential hypertension, benign Assessment & Plan: Blood pressure as outlined.  On metoprolol .   Cardiology recently decreased dose to 12.5mg  q day. Follow pressures. Follow metabolic panel.    Discolored semen Assessment & Plan: Saw urology. No further w/up warranted.    Coronary artery disease involving native coronary artery of native heart with angina pectoris Assessment & Plan: Heart catheterization 01/26/14 - proximal LAD 50%, mid LAD 99%, small first diagonal - 90% stenosis, first obtuse marginal 70% and small non dominant RCA.  S/P stent placement mid LAD (99%-0%).   Dr Gollan f/u 12/2022 - Stress test reviewed showing very small region of possible ischemia in the distal inferolateral wall.  Hold on cath - no symptoms. Now on crestor  40mg  q day - goal LDL < 70.  Currently stable.  Had f/u with cardiology recently - stable - no changes.  Saw cardiology 02/05/24 - recommended to decrease metoprolol  to 12.5mg  q day. Also recommended to stop aspirin .    Atrial fibrillation, unspecified type [facility]) Assessment & Plan: Atrial fibrillation. Recent stress testing was of intermediate risk with a small area of apical-mid inferolateral ischemia. Echo shows normal LV function with mildly to moderately dilated left atrium. Elected to hold on cardioversion. Saw cardiology 02/05/24 - recommended to decrease metoprolol  to 12.5mg  q day. Also recommended to stop aspirin . Overall stable. Continue risk factor modification.     Aortic atherosclerosis Assessment & Plan: Continue crestor .       Allena Hamilton, MD

## 2024-06-15 ENCOUNTER — Other Ambulatory Visit: Payer: Self-pay | Admitting: *Deleted

## 2024-06-15 DIAGNOSIS — D75839 Thrombocytosis, unspecified: Secondary | ICD-10-CM

## 2024-06-15 DIAGNOSIS — R739 Hyperglycemia, unspecified: Secondary | ICD-10-CM

## 2024-06-15 DIAGNOSIS — I10 Essential (primary) hypertension: Secondary | ICD-10-CM

## 2024-06-15 DIAGNOSIS — E7800 Pure hypercholesterolemia, unspecified: Secondary | ICD-10-CM

## 2024-06-16 ENCOUNTER — Other Ambulatory Visit: Payer: Self-pay | Admitting: Internal Medicine

## 2024-06-19 ENCOUNTER — Encounter: Payer: Self-pay | Admitting: Internal Medicine

## 2024-06-19 NOTE — Assessment & Plan Note (Signed)
 Atrial fibrillation. Recent stress testing was of intermediate risk with a small area of apical-mid inferolateral ischemia. Echo shows normal LV function with mildly to moderately dilated left atrium. Elected to hold on cardioversion. Saw cardiology 02/05/24 - recommended to decrease metoprolol  to 12.5mg  q day. Also recommended to stop aspirin . Overall stable. Continue risk factor modification.

## 2024-06-19 NOTE — Assessment & Plan Note (Signed)
 Low-carb diet and exercise.  Follow met b and A1c.

## 2024-06-19 NOTE — Assessment & Plan Note (Signed)
 Continue crestor

## 2024-06-19 NOTE — Assessment & Plan Note (Signed)
 Doing well on keppra . No seizures. Follow.

## 2024-06-19 NOTE — Assessment & Plan Note (Signed)
 Continue cpap.

## 2024-06-19 NOTE — Assessment & Plan Note (Signed)
 Blood pressure as outlined.  On metoprolol .   Cardiology recently decreased dose to 12.5mg  q day. Follow pressures. Follow metabolic panel.

## 2024-06-19 NOTE — Assessment & Plan Note (Signed)
 No upper symptoms. Continue omeprazole .

## 2024-06-19 NOTE — Assessment & Plan Note (Signed)
 Heart catheterization 01/26/14 - proximal LAD 50%, mid LAD 99%, small first diagonal - 90% stenosis, first obtuse marginal 70% and small non dominant RCA.  S/P stent placement mid LAD (99%-0%).   Dr Gollan f/u 12/2022 - Stress test reviewed showing very small region of possible ischemia in the distal inferolateral wall.  Hold on cath - no symptoms. Now on crestor  40mg  q day - goal LDL < 70.  Currently stable.  Had f/u with cardiology recently - stable - no changes.  Saw cardiology 02/05/24 - recommended to decrease metoprolol  to 12.5mg  q day. Also recommended to stop aspirin .

## 2024-06-19 NOTE — Assessment & Plan Note (Signed)
 Continue crestor .  Continue diet and exercise.  Follow lipid panel and liver function tests.  Lab Results  Component Value Date   CHOL 118 06/09/2024   HDL 29.70 (L) 06/09/2024   LDLCALC 47 06/09/2024   LDLDIRECT 99.1 07/29/2013   TRIG 205.0 (H) 06/09/2024   CHOLHDL 4 06/09/2024

## 2024-06-19 NOTE — Assessment & Plan Note (Signed)
 Saw urology. No further w/up warranted.

## 2024-06-28 ENCOUNTER — Encounter: Payer: Self-pay | Admitting: Internal Medicine

## 2024-06-28 DIAGNOSIS — R944 Abnormal results of kidney function studies: Secondary | ICD-10-CM

## 2024-06-28 DIAGNOSIS — M810 Age-related osteoporosis without current pathological fracture: Secondary | ICD-10-CM

## 2024-06-28 NOTE — Telephone Encounter (Signed)
Bone density pended

## 2024-06-28 NOTE — Telephone Encounter (Signed)
 The bone density is ordered. Also, he needs non fasting lab appt (met b only) in 7-10 days.

## 2024-06-28 NOTE — Telephone Encounter (Signed)
Spoke to pt   Lab appt made

## 2024-07-05 ENCOUNTER — Other Ambulatory Visit (INDEPENDENT_AMBULATORY_CARE_PROVIDER_SITE_OTHER)

## 2024-07-05 DIAGNOSIS — R944 Abnormal results of kidney function studies: Secondary | ICD-10-CM

## 2024-07-05 LAB — BASIC METABOLIC PANEL WITH GFR
BUN: 16 mg/dL (ref 6–23)
CO2: 25 meq/L (ref 19–32)
Calcium: 9.2 mg/dL (ref 8.4–10.5)
Chloride: 105 meq/L (ref 96–112)
Creatinine, Ser: 1.44 mg/dL (ref 0.40–1.50)
GFR: 45.07 mL/min — ABNORMAL LOW (ref >60.00)
Glucose, Bld: 103 mg/dL — ABNORMAL HIGH (ref 70–99)
Potassium: 4.1 meq/L (ref 3.5–5.1)
Sodium: 139 meq/L (ref 135–145)

## 2024-07-06 ENCOUNTER — Ambulatory Visit: Payer: Self-pay | Admitting: Internal Medicine

## 2024-07-06 DIAGNOSIS — R944 Abnormal results of kidney function studies: Secondary | ICD-10-CM

## 2024-07-06 DIAGNOSIS — M810 Age-related osteoporosis without current pathological fracture: Secondary | ICD-10-CM

## 2024-07-06 DIAGNOSIS — R899 Unspecified abnormal finding in specimens from other organs, systems and tissues: Secondary | ICD-10-CM

## 2024-07-06 MED ORDER — AZELASTINE HCL 0.1 % NA SOLN: 1.0000 | Freq: Two times a day (BID) | NASAL | 1 refills | Status: AC

## 2024-07-06 NOTE — Telephone Encounter (Signed)
 Rx sent in for astelin  nasal spray. Met b ordered for future labs. Order placed for renal ultrasound.

## 2024-07-12 ENCOUNTER — Ambulatory Visit
Admission: RE | Admit: 2024-07-12 | Discharge: 2024-07-12 | Disposition: A | Discharge status: Home / Self Care | Source: Ambulatory Visit | Attending: Internal Medicine | Admitting: Internal Medicine

## 2024-07-12 DIAGNOSIS — R944 Abnormal results of kidney function studies: Secondary | ICD-10-CM | POA: Insufficient documentation

## 2024-07-12 DIAGNOSIS — N281 Cyst of kidney, acquired: Secondary | ICD-10-CM | POA: Diagnosis not present

## 2024-07-13 ENCOUNTER — Ambulatory Visit: Payer: Self-pay | Admitting: Internal Medicine

## 2024-07-13 DIAGNOSIS — N183 Chronic kidney disease, stage 3 unspecified: Secondary | ICD-10-CM

## 2024-07-26 ENCOUNTER — Other Ambulatory Visit (INDEPENDENT_AMBULATORY_CARE_PROVIDER_SITE_OTHER)

## 2024-07-26 DIAGNOSIS — R944 Abnormal results of kidney function studies: Secondary | ICD-10-CM | POA: Diagnosis not present

## 2024-07-26 LAB — BASIC METABOLIC PANEL WITH GFR
BUN: 17 mg/dL (ref 6–23)
CO2: 29 meq/L (ref 19–32)
Calcium: 9.8 mg/dL (ref 8.4–10.5)
Chloride: 103 meq/L (ref 96–112)
Creatinine, Ser: 1.42 mg/dL (ref 0.40–1.50)
GFR: 45.82 mL/min — ABNORMAL LOW (ref >60.00)
Glucose, Bld: 101 mg/dL — ABNORMAL HIGH (ref 70–99)
Potassium: 4.3 meq/L (ref 3.5–5.1)
Sodium: 139 meq/L (ref 135–145)

## 2024-07-27 NOTE — Telephone Encounter (Signed)
 Pt notified of results & okay with nephrology referral (pended below)

## 2024-07-27 NOTE — Telephone Encounter (Signed)
Order placed for nephrology referral.   °

## 2024-08-01 DIAGNOSIS — H40003 Preglaucoma, unspecified, bilateral: Secondary | ICD-10-CM | POA: Diagnosis not present

## 2024-08-02 ENCOUNTER — Ambulatory Visit
Admission: RE | Admit: 2024-08-02 | Discharge: 2024-08-02 | Disposition: A | Discharge status: Home / Self Care | Source: Ambulatory Visit | Attending: Internal Medicine | Admitting: Internal Medicine

## 2024-08-02 DIAGNOSIS — M810 Age-related osteoporosis without current pathological fracture: Secondary | ICD-10-CM | POA: Diagnosis not present

## 2024-08-08 DIAGNOSIS — Z961 Presence of intraocular lens: Secondary | ICD-10-CM | POA: Diagnosis not present

## 2024-08-08 DIAGNOSIS — H40003 Preglaucoma, unspecified, bilateral: Secondary | ICD-10-CM | POA: Diagnosis not present

## 2024-08-08 DIAGNOSIS — H43813 Vitreous degeneration, bilateral: Secondary | ICD-10-CM | POA: Diagnosis not present

## 2024-08-08 DIAGNOSIS — G43109 Migraine with aura, not intractable, without status migrainosus: Secondary | ICD-10-CM | POA: Diagnosis not present

## 2024-08-09 NOTE — Telephone Encounter (Signed)
 Order placed for endocrinology referral.

## 2024-08-09 NOTE — Addendum Note (Signed)
 Addended by: GLENDIA ALLENA RAMAN on: 08/09/2024 06:06 AM   Modules accepted: Orders

## 2024-08-17 ENCOUNTER — Other Ambulatory Visit: Payer: Self-pay | Admitting: Internal Medicine

## 2024-08-31 ENCOUNTER — Encounter: Payer: Self-pay | Admitting: Internal Medicine

## 2024-08-31 DIAGNOSIS — N1831 Chronic kidney disease, stage 3a: Secondary | ICD-10-CM | POA: Insufficient documentation

## 2024-09-12 ENCOUNTER — Other Ambulatory Visit: Payer: Self-pay | Admitting: Internal Medicine

## 2024-10-13 ENCOUNTER — Other Ambulatory Visit

## 2024-10-17 ENCOUNTER — Ambulatory Visit: Admitting: Internal Medicine

## 2024-10-17 ENCOUNTER — Other Ambulatory Visit

## 2025-03-08 ENCOUNTER — Ambulatory Visit
